# Patient Record
Sex: Male | Born: 1945 | Race: White | Hispanic: No | Smoking: Former smoker
Health system: Southern US, Community
[De-identification: ages and names within clinical notes are randomized; demographics above are authoritative.]

## PROBLEM LIST (undated history)

## (undated) DIAGNOSIS — I10 Essential (primary) hypertension: Secondary | ICD-10-CM

## (undated) HISTORY — PX: TONSILLECTOMY AND ADENOIDECTOMY: SUR1326

## (undated) HISTORY — PX: CIRCUMCISION: SUR203

---

## 2016-04-07 DIAGNOSIS — Z87891 Personal history of nicotine dependence: Secondary | ICD-10-CM | POA: Diagnosis not present

## 2016-04-07 DIAGNOSIS — E785 Hyperlipidemia, unspecified: Secondary | ICD-10-CM | POA: Diagnosis not present

## 2016-04-07 DIAGNOSIS — I10 Essential (primary) hypertension: Secondary | ICD-10-CM | POA: Diagnosis not present

## 2016-10-18 DIAGNOSIS — Z23 Encounter for immunization: Secondary | ICD-10-CM | POA: Diagnosis not present

## 2016-10-18 DIAGNOSIS — S81012A Laceration without foreign body, left knee, initial encounter: Secondary | ICD-10-CM | POA: Diagnosis not present

## 2016-11-11 DIAGNOSIS — B9789 Other viral agents as the cause of diseases classified elsewhere: Secondary | ICD-10-CM | POA: Diagnosis not present

## 2017-04-18 DIAGNOSIS — I10 Essential (primary) hypertension: Secondary | ICD-10-CM | POA: Diagnosis not present

## 2017-04-18 DIAGNOSIS — E785 Hyperlipidemia, unspecified: Secondary | ICD-10-CM | POA: Diagnosis not present

## 2017-07-24 DIAGNOSIS — T63444A Toxic effect of venom of bees, undetermined, initial encounter: Secondary | ICD-10-CM | POA: Diagnosis not present

## 2018-05-19 DIAGNOSIS — J019 Acute sinusitis, unspecified: Secondary | ICD-10-CM | POA: Diagnosis not present

## 2018-06-22 DIAGNOSIS — N400 Enlarged prostate without lower urinary tract symptoms: Secondary | ICD-10-CM | POA: Diagnosis not present

## 2018-06-22 DIAGNOSIS — Z1211 Encounter for screening for malignant neoplasm of colon: Secondary | ICD-10-CM | POA: Diagnosis not present

## 2018-06-22 DIAGNOSIS — Z1159 Encounter for screening for other viral diseases: Secondary | ICD-10-CM | POA: Diagnosis not present

## 2018-06-22 DIAGNOSIS — D696 Thrombocytopenia, unspecified: Secondary | ICD-10-CM | POA: Diagnosis not present

## 2018-06-22 DIAGNOSIS — E785 Hyperlipidemia, unspecified: Secondary | ICD-10-CM | POA: Diagnosis not present

## 2018-06-22 DIAGNOSIS — Z5181 Encounter for therapeutic drug level monitoring: Secondary | ICD-10-CM | POA: Diagnosis not present

## 2018-06-22 DIAGNOSIS — I10 Essential (primary) hypertension: Secondary | ICD-10-CM | POA: Diagnosis not present

## 2018-06-22 DIAGNOSIS — D649 Anemia, unspecified: Secondary | ICD-10-CM | POA: Diagnosis not present

## 2018-07-31 DIAGNOSIS — D485 Neoplasm of uncertain behavior of skin: Secondary | ICD-10-CM | POA: Diagnosis not present

## 2018-08-02 DIAGNOSIS — L989 Disorder of the skin and subcutaneous tissue, unspecified: Secondary | ICD-10-CM | POA: Diagnosis not present

## 2018-08-02 DIAGNOSIS — D485 Neoplasm of uncertain behavior of skin: Secondary | ICD-10-CM | POA: Diagnosis not present

## 2018-08-03 DIAGNOSIS — L989 Disorder of the skin and subcutaneous tissue, unspecified: Secondary | ICD-10-CM | POA: Diagnosis not present

## 2018-08-03 DIAGNOSIS — L578 Other skin changes due to chronic exposure to nonionizing radiation: Secondary | ICD-10-CM | POA: Diagnosis not present

## 2018-08-07 DIAGNOSIS — H5203 Hypermetropia, bilateral: Secondary | ICD-10-CM | POA: Diagnosis not present

## 2018-08-07 DIAGNOSIS — H02831 Dermatochalasis of right upper eyelid: Secondary | ICD-10-CM | POA: Diagnosis not present

## 2018-08-07 DIAGNOSIS — H16223 Keratoconjunctivitis sicca, not specified as Sjogren's, bilateral: Secondary | ICD-10-CM | POA: Diagnosis not present

## 2018-08-07 DIAGNOSIS — H52203 Unspecified astigmatism, bilateral: Secondary | ICD-10-CM | POA: Diagnosis not present

## 2018-08-07 DIAGNOSIS — H04123 Dry eye syndrome of bilateral lacrimal glands: Secondary | ICD-10-CM | POA: Diagnosis not present

## 2018-08-07 DIAGNOSIS — H2513 Age-related nuclear cataract, bilateral: Secondary | ICD-10-CM | POA: Diagnosis not present

## 2018-08-07 DIAGNOSIS — H43393 Other vitreous opacities, bilateral: Secondary | ICD-10-CM | POA: Diagnosis not present

## 2018-08-07 DIAGNOSIS — H02834 Dermatochalasis of left upper eyelid: Secondary | ICD-10-CM | POA: Diagnosis not present

## 2018-08-07 DIAGNOSIS — H524 Presbyopia: Secondary | ICD-10-CM | POA: Diagnosis not present

## 2018-08-28 DIAGNOSIS — D696 Thrombocytopenia, unspecified: Secondary | ICD-10-CM | POA: Diagnosis not present

## 2018-08-28 DIAGNOSIS — R799 Abnormal finding of blood chemistry, unspecified: Secondary | ICD-10-CM | POA: Diagnosis not present

## 2018-08-28 DIAGNOSIS — D693 Immune thrombocytopenic purpura: Secondary | ICD-10-CM | POA: Diagnosis not present

## 2018-12-14 DIAGNOSIS — J111 Influenza due to unidentified influenza virus with other respiratory manifestations: Secondary | ICD-10-CM | POA: Diagnosis not present

## 2019-02-05 DIAGNOSIS — N400 Enlarged prostate without lower urinary tract symptoms: Secondary | ICD-10-CM | POA: Diagnosis not present

## 2019-02-05 DIAGNOSIS — Z0000 Encounter for general adult medical examination without abnormal findings: Secondary | ICD-10-CM | POA: Diagnosis not present

## 2019-02-05 DIAGNOSIS — I10 Essential (primary) hypertension: Secondary | ICD-10-CM | POA: Diagnosis not present

## 2019-02-05 DIAGNOSIS — Z125 Encounter for screening for malignant neoplasm of prostate: Secondary | ICD-10-CM | POA: Diagnosis not present

## 2019-02-05 DIAGNOSIS — D696 Thrombocytopenia, unspecified: Secondary | ICD-10-CM | POA: Diagnosis not present

## 2019-02-05 DIAGNOSIS — E785 Hyperlipidemia, unspecified: Secondary | ICD-10-CM | POA: Diagnosis not present

## 2019-02-05 DIAGNOSIS — N529 Male erectile dysfunction, unspecified: Secondary | ICD-10-CM | POA: Diagnosis not present

## 2019-08-22 DIAGNOSIS — N529 Male erectile dysfunction, unspecified: Secondary | ICD-10-CM | POA: Diagnosis not present

## 2019-08-22 DIAGNOSIS — I10 Essential (primary) hypertension: Secondary | ICD-10-CM | POA: Diagnosis not present

## 2019-08-22 DIAGNOSIS — N400 Enlarged prostate without lower urinary tract symptoms: Secondary | ICD-10-CM | POA: Diagnosis not present

## 2019-08-22 DIAGNOSIS — E785 Hyperlipidemia, unspecified: Secondary | ICD-10-CM | POA: Diagnosis not present

## 2019-08-22 DIAGNOSIS — Z0000 Encounter for general adult medical examination without abnormal findings: Secondary | ICD-10-CM | POA: Diagnosis not present

## 2019-10-22 DIAGNOSIS — H5203 Hypermetropia, bilateral: Secondary | ICD-10-CM | POA: Diagnosis not present

## 2019-10-22 DIAGNOSIS — H524 Presbyopia: Secondary | ICD-10-CM | POA: Diagnosis not present

## 2019-10-22 DIAGNOSIS — H43393 Other vitreous opacities, bilateral: Secondary | ICD-10-CM | POA: Diagnosis not present

## 2019-10-22 DIAGNOSIS — H02831 Dermatochalasis of right upper eyelid: Secondary | ICD-10-CM | POA: Diagnosis not present

## 2019-10-22 DIAGNOSIS — H16223 Keratoconjunctivitis sicca, not specified as Sjogren's, bilateral: Secondary | ICD-10-CM | POA: Diagnosis not present

## 2019-10-22 DIAGNOSIS — H02834 Dermatochalasis of left upper eyelid: Secondary | ICD-10-CM | POA: Diagnosis not present

## 2019-10-22 DIAGNOSIS — H52203 Unspecified astigmatism, bilateral: Secondary | ICD-10-CM | POA: Diagnosis not present

## 2019-10-22 DIAGNOSIS — H2513 Age-related nuclear cataract, bilateral: Secondary | ICD-10-CM | POA: Diagnosis not present

## 2019-11-05 DIAGNOSIS — Z23 Encounter for immunization: Secondary | ICD-10-CM | POA: Diagnosis not present

## 2020-03-02 ENCOUNTER — Ambulatory Visit: Payer: Self-pay | Attending: Internal Medicine

## 2020-03-02 DIAGNOSIS — Z23 Encounter for immunization: Secondary | ICD-10-CM

## 2020-03-02 NOTE — Progress Notes (Signed)
   Covid-19 Vaccination Clinic  Name:  Robert Stark    MRN: [id number] DOB: 1946/01/16  03/02/2020  [name] was observed post Covid-19 immunization for 15 minutes without incident. He was provided with Vaccine Information Sheet and instruction to access the V-Safe system.   [name] was instructed to call 911 with any severe reactions post vaccine: Marland Kitchen Difficulty breathing  . Swelling of face and throat  . A fast heartbeat  . A bad rash all over body  . Dizziness and weakness   Immunizations Administered    Name Date Dose VIS Date Route   Pfizer COVID-19 Vaccine 03/02/2020  5:13 PM 0.3 mL 11/29/2019 Intramuscular   Manufacturer: [employer]   Lot: [id number]   [place]: [id number]

## 2020-04-09 DIAGNOSIS — N529 Male erectile dysfunction, unspecified: Secondary | ICD-10-CM | POA: Diagnosis not present

## 2020-04-09 DIAGNOSIS — M722 Plantar fascial fibromatosis: Secondary | ICD-10-CM | POA: Diagnosis not present

## 2020-04-09 DIAGNOSIS — M79671 Pain in right foot: Secondary | ICD-10-CM | POA: Diagnosis not present

## 2020-12-03 DIAGNOSIS — H2513 Age-related nuclear cataract, bilateral: Secondary | ICD-10-CM | POA: Diagnosis not present

## 2021-06-10 DIAGNOSIS — E785 Hyperlipidemia, unspecified: Secondary | ICD-10-CM | POA: Diagnosis not present

## 2021-06-10 DIAGNOSIS — I10 Essential (primary) hypertension: Secondary | ICD-10-CM | POA: Diagnosis not present

## 2021-06-14 DIAGNOSIS — Z0000 Encounter for general adult medical examination without abnormal findings: Secondary | ICD-10-CM | POA: Diagnosis not present

## 2021-06-14 DIAGNOSIS — I10 Essential (primary) hypertension: Secondary | ICD-10-CM | POA: Diagnosis not present

## 2021-07-30 DIAGNOSIS — H47011 Ischemic optic neuropathy, right eye: Secondary | ICD-10-CM | POA: Diagnosis not present

## 2021-07-30 DIAGNOSIS — H4710 Unspecified papilledema: Secondary | ICD-10-CM | POA: Diagnosis not present

## 2021-08-18 DIAGNOSIS — H47019 Ischemic optic neuropathy, unspecified eye: Secondary | ICD-10-CM | POA: Diagnosis not present

## 2021-08-18 DIAGNOSIS — Z87891 Personal history of nicotine dependence: Secondary | ICD-10-CM | POA: Diagnosis not present

## 2021-09-02 DIAGNOSIS — H4710 Unspecified papilledema: Secondary | ICD-10-CM | POA: Diagnosis not present

## 2021-09-02 DIAGNOSIS — H47011 Ischemic optic neuropathy, right eye: Secondary | ICD-10-CM | POA: Diagnosis not present

## 2021-09-03 DIAGNOSIS — I10 Essential (primary) hypertension: Secondary | ICD-10-CM | POA: Diagnosis not present

## 2021-09-03 DIAGNOSIS — H47019 Ischemic optic neuropathy, unspecified eye: Secondary | ICD-10-CM | POA: Diagnosis not present

## 2021-09-28 DIAGNOSIS — M545 Low back pain, unspecified: Secondary | ICD-10-CM | POA: Diagnosis not present

## 2021-09-28 DIAGNOSIS — I10 Essential (primary) hypertension: Secondary | ICD-10-CM | POA: Diagnosis not present

## 2021-09-28 DIAGNOSIS — H47019 Ischemic optic neuropathy, unspecified eye: Secondary | ICD-10-CM | POA: Diagnosis not present

## 2021-09-28 DIAGNOSIS — Z23 Encounter for immunization: Secondary | ICD-10-CM | POA: Diagnosis not present

## 2021-09-28 DIAGNOSIS — G8929 Other chronic pain: Secondary | ICD-10-CM | POA: Diagnosis not present

## 2021-10-27 DIAGNOSIS — R0602 Shortness of breath: Secondary | ICD-10-CM | POA: Diagnosis not present

## 2021-10-28 ENCOUNTER — Emergency Department (HOSPITAL_BASED_OUTPATIENT_CLINIC_OR_DEPARTMENT_OTHER): Payer: PPO

## 2021-10-28 ENCOUNTER — Other Ambulatory Visit: Payer: Self-pay

## 2021-10-28 ENCOUNTER — Encounter (HOSPITAL_BASED_OUTPATIENT_CLINIC_OR_DEPARTMENT_OTHER): Payer: Self-pay | Admitting: Emergency Medicine

## 2021-10-28 ENCOUNTER — Emergency Department (HOSPITAL_BASED_OUTPATIENT_CLINIC_OR_DEPARTMENT_OTHER)
Admission: EM | Admit: 2021-10-28 | Discharge: 2021-10-28 | Disposition: A | Discharge status: Home / Self Care | Payer: PPO | Attending: Emergency Medicine | Admitting: Emergency Medicine

## 2021-10-28 ENCOUNTER — Other Ambulatory Visit (HOSPITAL_BASED_OUTPATIENT_CLINIC_OR_DEPARTMENT_OTHER): Payer: Self-pay

## 2021-10-28 DIAGNOSIS — Z87891 Personal history of nicotine dependence: Secondary | ICD-10-CM | POA: Insufficient documentation

## 2021-10-28 DIAGNOSIS — I4891 Unspecified atrial fibrillation: Secondary | ICD-10-CM | POA: Insufficient documentation

## 2021-10-28 DIAGNOSIS — R059 Cough, unspecified: Secondary | ICD-10-CM | POA: Insufficient documentation

## 2021-10-28 DIAGNOSIS — R0602 Shortness of breath: Secondary | ICD-10-CM | POA: Diagnosis not present

## 2021-10-28 DIAGNOSIS — Z20822 Contact with and (suspected) exposure to covid-19: Secondary | ICD-10-CM | POA: Insufficient documentation

## 2021-10-28 DIAGNOSIS — I10 Essential (primary) hypertension: Secondary | ICD-10-CM | POA: Diagnosis not present

## 2021-10-28 HISTORY — DX: Essential (primary) hypertension: I10

## 2021-10-28 LAB — TSH: TSH: 0.566 u[IU]/mL (ref 0.350–4.500)

## 2021-10-28 LAB — COMPREHENSIVE METABOLIC PANEL
ALT: 23 U/L (ref 0–44)
AST: 16 U/L (ref 15–41)
Albumin: 4.4 g/dL (ref 3.5–5.0)
Alkaline Phosphatase: 73 U/L (ref 38–126)
Anion gap: 11 (ref 5–15)
BUN: 26 mg/dL — ABNORMAL HIGH (ref 8–23)
CO2: 25 mmol/L (ref 22–32)
Calcium: 9.7 mg/dL (ref 8.9–10.3)
Chloride: 108 mmol/L (ref 98–111)
Creatinine, Ser: 1.61 mg/dL — ABNORMAL HIGH (ref 0.61–1.24)
GFR, Estimated: 45 mL/min — ABNORMAL LOW (ref >60)
Glucose, Bld: 145 mg/dL — ABNORMAL HIGH (ref 70–99)
Potassium: 3.7 mmol/L (ref 3.5–5.1)
Sodium: 144 mmol/L (ref 135–145)
Total Bilirubin: 2.1 mg/dL — ABNORMAL HIGH (ref 0.3–1.2)
Total Protein: 6.2 g/dL — ABNORMAL LOW (ref 6.5–8.1)

## 2021-10-28 LAB — URINALYSIS, ROUTINE W REFLEX MICROSCOPIC
Bilirubin Urine: NEGATIVE
Glucose, UA: NEGATIVE mg/dL
Hgb urine dipstick: NEGATIVE
Ketones, ur: NEGATIVE mg/dL
Leukocytes,Ua: NEGATIVE
Nitrite: NEGATIVE
Protein, ur: NEGATIVE mg/dL
Specific Gravity, Urine: 1.015 (ref 1.005–1.030)
pH: 5.5 (ref 5.0–8.0)

## 2021-10-28 LAB — CBC
HCT: 41.2 % (ref 39.0–52.0)
Hemoglobin: 14.3 g/dL (ref 13.0–17.0)
MCH: 30.8 pg (ref 26.0–34.0)
MCHC: 34.7 g/dL (ref 30.0–36.0)
MCV: 88.6 fL (ref 80.0–100.0)
Platelets: 102 10*3/uL — ABNORMAL LOW (ref 150–400)
RBC: 4.65 MIL/uL (ref 4.22–5.81)
RDW: 14.1 % (ref 11.5–15.5)
WBC: 12 10*3/uL — ABNORMAL HIGH (ref 4.0–10.5)
nRBC: 0 % (ref 0.0–0.2)

## 2021-10-28 LAB — TROPONIN I (HIGH SENSITIVITY)
Troponin I (High Sensitivity): 10 ng/L (ref <18)
Troponin I (High Sensitivity): 9 ng/L (ref <18)

## 2021-10-28 LAB — RESP PANEL BY RT-PCR (FLU A&B, COVID) ARPGX2
Influenza A by PCR: NEGATIVE
Influenza B by PCR: NEGATIVE
SARS Coronavirus 2 by RT PCR: NEGATIVE

## 2021-10-28 MED ORDER — DILTIAZEM HCL 25 MG/5ML IV SOLN
20.0000 mg | Freq: Once | INTRAVENOUS | Status: AC
  Administered 2021-10-28: 20 mg via INTRAVENOUS
  Filled 2021-10-28: qty 5

## 2021-10-28 MED ORDER — RIVAROXABAN (XARELTO) VTE STARTER PACK (15 & 20 MG)
ORAL_TABLET | ORAL | 0 refills | Status: DC
  Filled 2021-10-28: qty 51, 30d supply, fill #0

## 2021-10-28 MED ORDER — ALBUTEROL SULFATE HFA 108 (90 BASE) MCG/ACT IN AERS
2.0000 | INHALATION_SPRAY | RESPIRATORY_TRACT | Status: DC | PRN
  Administered 2021-10-28: 2 via RESPIRATORY_TRACT
  Filled 2021-10-28: qty 6.7

## 2021-10-28 MED ORDER — RIVAROXABAN (XARELTO) VTE STARTER PACK (15 & 20 MG): ORAL_TABLET | ORAL | 0 refills | Status: DC

## 2021-10-28 NOTE — ED Notes (Signed)
ED Provider at bedside. 

## 2021-10-28 NOTE — ED Triage Notes (Signed)
Cough x 2 weeks , shortness of breath at rest x 2 weeks . Fatigue . Denies fever

## 2021-10-28 NOTE — ED Notes (Addendum)
Pt ambulated to restroom with steady gait. No issues observed. Pts HR stayed around 90bpm and sp02 stayed around 95% during ambulation. PA made aware.

## 2021-10-28 NOTE — Discharge Instructions (Addendum)
You are found to have an abnormal heart rhythm today.  It is called atrial fibrillation.  Information about this is attached to your discharge papers.  Please take the blood thinner at your pharmacy as prescribed.  Information about this blood thinner is also attached to these papers.  Follow-up with a cardiologist early next week and schedule an appointment with your primary care provider soon as possible to discuss your visit today.  It is also important to discuss your kidney function at this appointment.  Please return to the emergency department if you begin to feel short of breath, have chest pain, become dizzy or any overall worsening of your condition.  It was a pleasure to meet you today and I hope that you feel better.

## 2021-10-28 NOTE — ED Provider Notes (Signed)
[name] Provider Note   CSN: [id number] Arrival date & time: 10/28/21  1009     History Chief Complaint  Patient presents with   Shortness of Breath    cough    Robert Stark is a 75 y.o. male with a past medical history of hypertension presenting today with complaint of shortness of breath and cough.  He had an appointment with his primary care yesterday who suggested that he go to the emergency department for a chest x-ray.  States he has been feeling this way for the past 2 weeks.  No chest pain, dizziness, fever, chills.  Takes amlodipine and "male enhancement" that he started about a month ago.  Ordered this supplement off of a commercial however has since discontinued it with concern for vision problems.  Followed by ophthalmology.   Past Medical History:  Diagnosis Date   Hypertension     There are no problems to display for this patient.   History reviewed. No pertinent surgical history.     No family history on file.  Social History   Tobacco Use   Smoking status: Former    Types: Cigarettes  Substance Use Topics   Alcohol use: Not Currently   Drug use: Not Currently    Home Medications Prior to Admission medications   Not on File    Allergies    Cheese, Bee venom, and Wasp venom  Review of Systems   Review of Systems  Constitutional:  Negative for chills and fever.  Respiratory:  Positive for cough and shortness of breath.   Cardiovascular:  Negative for chest pain and palpitations.  Gastrointestinal:  Negative for nausea and vomiting.  Musculoskeletal:  Negative for back pain.  Neurological:  Negative for dizziness and light-headedness.  All other systems reviewed and are negative.  Physical Exam Updated Vital Signs BP 139/90   Pulse 61   Temp 98.3 F (36.8 C) (Oral)   Resp (!) 24   Ht 5\' 6"  (1.676 m)   Wt 68 kg   SpO2 92%   BMI 24.21 kg/m   Physical Exam Vitals and nursing note reviewed.   Constitutional:      Appearance: Normal appearance.  HENT:     Head: Normocephalic and atraumatic.     Mouth/Throat:     Mouth: Mucous membranes are moist.     Pharynx: Oropharynx is clear.  Eyes:     General: No scleral icterus.    Conjunctiva/sclera: Conjunctivae normal.  Cardiovascular:     Rate and Rhythm: Tachycardia present. Rhythm irregular.  Pulmonary:     Effort: Pulmonary effort is normal. Tachypnea present. No respiratory distress.     Breath sounds: No decreased breath sounds, wheezing or rhonchi.  Skin:    General: Skin is warm and dry.     Findings: Ecchymosis (Area of bruising on right flank) present. No rash.  Neurological:     Mental Status: He is alert.  Psychiatric:        Mood and Affect: Mood normal.        Behavior: Behavior normal.    ED Results / Procedures / Treatments   Labs (all labs ordered are listed, but only abnormal results are displayed) Labs Reviewed  RESP PANEL BY RT-PCR (FLU A&B, COVID) ARPGX2  COMPREHENSIVE METABOLIC PANEL  CBC  TSH  URINALYSIS, ROUTINE W REFLEX MICROSCOPIC  TROPONIN I (HIGH SENSITIVITY)    EKG EKG Interpretation  Date/Time:  Thursday October 28 2021 10:30:52 EST Ventricular Rate:  139 PR Interval:    QRS Duration: 96 QT Interval:  334 QTC Calculation: 508 R Axis:   84 Text Interpretation: Atrial fibrillation with rapid ventricular response Nonspecific T wave abnormality Abnormal ECG No old tracing to compare Confirmed by Isla Pence [phone]) on 10/28/2021 10:40:34 AM  Radiology DG Chest 2 View  Result Date: 10/28/2021 CLINICAL DATA:  sob EXAM: CHEST - 2 VIEW COMPARISON:  None. FINDINGS: Small right and possible trace left pleural effusion. Mild overlying bibasilar opacities. No visible pneumothorax. No acute osseous abnormality. IMPRESSION: Small right and possible trace left pleural effusions. Mild overlying bibasilar opacities, most likely atelectasis. Electronically Signed   By: Margaretha Sheffield M.D.    On: 10/28/2021 10:49    Procedures Procedures   Medications Ordered in ED Medications  albuterol (VENTOLIN HFA) 108 (90 Base) MCG/ACT inhaler 2 puff (has no administration in time range)  diltiazem (CARDIZEM) injection 20 mg (has no administration in time range)    ED Course  I have reviewed the triage vital signs and the nursing notes.  Pertinent labs & imaging results that were available during my care of the patient were reviewed by me and considered in my medical decision making (see chart for details).    MDM Rules/Calculators/A&P The emergent differential diagnosis for shortness of breath includes, but is not limited to, Pulmonary edema, bronchoconstriction, Pneumonia, Pulmonary embolism, Pneumotherax/ Hemothorax, Dysrythmia, ACS.  All of these were considered throughout the evaluation of the patient.  When he arrived his EKG showed atrial fibrillation with RVR between 100-140s.  He does not have a history of this.  Sees a provider every year and takes blood pressure medication however no others.  He has been feeling this way for the past 2 weeks so he is out of the window for cardioversion today in the emergency department.  He was given 20 of diltiazem and patient's heart rate came down to the 70s. Lab work shows a bump in patient's creatinine, GFR measured at 45.  This is a decline in kidney function from his labs over the summer.  We discussed this and he will speak about this with his primary care provider.  Other lab work and COVID/flu testing negative.  Troponin negative.  No abnormalities on urinalysis.  Patient stable at this time.  He is ambulating throughout the department without a dramatic increase in his heart rate.  I am unsure what prompted his atrial fibrillation, however he is ready to be discharged with a Xarelto starter pack and cardiologist for follow-up.  We discussed that it is important for him to continue to not take any supplements or erectile dysfunction  medications at this time.  We also discussed proper use of blood thinners.  This will again be explained to him in his discharge papers and at the pharmacy.  He may follow-up with his  cardiologist or primary care provider about the longevity of anticoagulation.  Bleeding risk discussed.  Agreeable and stable for discharge at this time.  Final Clinical Impression(s) / ED Diagnoses Final diagnoses:  Atrial fibrillation, unspecified type ([place])    Rx / DC Orders Results and diagnoses were explained to the patient. Return precautions discussed in full. Patient had no additional questions and expressed complete understanding.     Rhae Hammock, PA-C 10/28/21 1323    Isla Pence, MD 10/28/21 1330

## 2021-10-29 ENCOUNTER — Telehealth (HOSPITAL_COMMUNITY): Payer: Self-pay

## 2021-10-29 ENCOUNTER — Other Ambulatory Visit (HOSPITAL_BASED_OUTPATIENT_CLINIC_OR_DEPARTMENT_OTHER): Payer: Self-pay

## 2021-10-29 NOTE — Telephone Encounter (Signed)
Patient was discharged on Xarelto starter pack on 10/28/21 but due to patient's Creatinine Clearance being less than 50, only 15mg  daily is currently recommended for Afib. Spoke with patient over the phone and advised patient to only take one of the 15mg  punch out tablets daily and with a meal until they follow up with their cardiologist on 11/16/21. Patient verbalized understanding of taking only one 15mg  tablet daily with a meal.   Waverly Ferrari, PharmD

## 2021-11-09 DIAGNOSIS — H4710 Unspecified papilledema: Secondary | ICD-10-CM | POA: Diagnosis not present

## 2021-11-09 DIAGNOSIS — J069 Acute upper respiratory infection, unspecified: Secondary | ICD-10-CM | POA: Diagnosis not present

## 2021-11-09 DIAGNOSIS — I10 Essential (primary) hypertension: Secondary | ICD-10-CM | POA: Diagnosis not present

## 2021-11-09 DIAGNOSIS — Z09 Encounter for follow-up examination after completed treatment for conditions other than malignant neoplasm: Secondary | ICD-10-CM | POA: Diagnosis not present

## 2021-11-09 DIAGNOSIS — H47011 Ischemic optic neuropathy, right eye: Secondary | ICD-10-CM | POA: Diagnosis not present

## 2021-11-09 DIAGNOSIS — I4891 Unspecified atrial fibrillation: Secondary | ICD-10-CM | POA: Diagnosis not present

## 2021-11-16 ENCOUNTER — Ambulatory Visit (INDEPENDENT_AMBULATORY_CARE_PROVIDER_SITE_OTHER): Payer: PPO | Admitting: Cardiovascular Disease

## 2021-11-16 ENCOUNTER — Other Ambulatory Visit: Payer: Self-pay

## 2021-11-16 ENCOUNTER — Encounter: Payer: Self-pay | Admitting: Cardiovascular Disease

## 2021-11-16 VITALS — BP 134/86 | HR 109 | Ht 66.0 in | Wt 149.0 lb

## 2021-11-16 DIAGNOSIS — N522 Drug-induced erectile dysfunction: Secondary | ICD-10-CM

## 2021-11-16 DIAGNOSIS — I509 Heart failure, unspecified: Secondary | ICD-10-CM | POA: Diagnosis not present

## 2021-11-16 DIAGNOSIS — E785 Hyperlipidemia, unspecified: Secondary | ICD-10-CM | POA: Diagnosis not present

## 2021-11-16 DIAGNOSIS — I10 Essential (primary) hypertension: Secondary | ICD-10-CM | POA: Diagnosis not present

## 2021-11-16 DIAGNOSIS — I4819 Other persistent atrial fibrillation: Secondary | ICD-10-CM

## 2021-11-16 MED ORDER — RIVAROXABAN 15 MG PO TABS: 15.0000 mg | ORAL_TABLET | Freq: Every day | ORAL | 3 refills | Status: DC

## 2021-11-16 MED ORDER — DILTIAZEM HCL ER COATED BEADS 180 MG PO CP24: 180.0000 mg | ORAL_CAPSULE | Freq: Every day | ORAL | 3 refills | Status: DC

## 2021-11-16 NOTE — Progress Notes (Addendum)
Cardiology Office Note:    Date:  11/16/2021   ID:  Robert Stark, DOB June 30, 1946, MRN [id number]  PCP:  Pcp, No   [facility] Providers Cardiologist:  None     Referring MD: Isla Pence, MD   Chief Complaint  Patient presents with   Atrial Fibrillation  Robert Stark is a 75 y.o. male who is being seen today for the evaluation of newly diagnosed atrial fibrillation  at the request of Isla Pence, MD.   History of Present Illness:    Robert Stark is a 75 y.o. male with a hx of systemic hypertension, BPH, cataracts, low back pain,  and erectile dysfunction, but otherwise good health, who was referred in consultation for newly recognized atrial fibrillation.  He presented to the emergency room on October 28, 2021 with complaints of shortness of breath.  He did not have any subjective palpitations.  He was found to be in atrial fibrillation with rapid ventricular response with a rate of around 140 bpm.  He denied orthopnea or PND or chest pain.  He denied dizziness or syncope.    His electrocardiogram was normal except for the presence of arrhythmia.  Cardiac troponin was normal.  BNP was not checked.  Chest x-ray did not show cardiomegaly or pulmonary infiltrates.  Routine labs were significant for an elevated creatinine of 1.6 (1.0 in June 2022) and normal electrolytes.  WBC was nominally elevated at 12 K, hemoglobin was normal at 14.3.  Flu and coronavirus assays were negative.  TSH was normal at 0.566.  Symptoms improved promptly after he received the diltiazem which controlled his heart rate.  He was prescribed Xarelto, but was not prescribed any rate control medication.  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  He understood that this was related to taking a supplement called "blood flow 7", which he has stopped. 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  Labs were checked including ESR CRP and CBC (all were normal).  No medications were ordered prescribed.  Had a follow-up visit with his primary care provider 1122 his blood pressure was normal at 124/80 and his heart rate was 70 (unclear if this was an automatic blood pressure cuff reading or based on auscultation or ECG).  Today he presents in atrial fibrillation with a ventricular rate of 109 bpm at rest.  He remains oblivious to the palpitations.  He is not currently complaining of shortness of breath.  Blood pressure is well controlled on a combination of amlodipine, losartan and hydrochlorothiazide.  He also takes tamsulosin for prostatism.  He has not had any bleeding, falls or injuries.  He reports compliance with Xarelto 50 mg once daily.  He was provided a courtesy starter pack with dosing for pulmonary embolism, but he understood that he is supposed to only take the 15 mg tablets once a day.  He has mild exertional dyspnea but denies orthopnea or PND or chest pain.  He does have a nonproductive cough for which he was prescribed Tessalon Perles.  It does appear to worsen when he lies in bed at night.  He denies previous history of cardiac illness and reports that his blood pressure has always been well controlled.  He denies heavy snoring or daytime hypersomnolence.  He quit smoking 7 years  ago.  Labs checked in 2020 show an LDL cholesterol of 58 and an HDL of 31, triglycerides normal at 75, total cholesterol 97.    Past Medical History:  Diagnosis Date   Hypertension     Past Surgical History:  Procedure Laterality Date   CIRCUMCISION     TONSILLECTOMY AND ADENOIDECTOMY      Current Medications: Current Meds  Medication Sig   atorvastatin (LIPITOR) 40 MG tablet Take 40 mg by mouth daily.   benzonatate (TESSALON) 200 MG capsule Take 200 mg by mouth 3 (three) times  daily as needed.   diltiazem (CARDIZEM CD) 180 MG 24 hr capsule Take 1 capsule (180 mg total) by mouth daily.   losartan-hydrochlorothiazide (HYZAAR) 100-12.5 MG tablet TAKE 1 TABLET BY MOUTH EVERY DAY FOR BLOOD PRESSURE   Rivaroxaban (XARELTO) 15 MG TABS tablet Take 1 tablet (15 mg total) by mouth daily with supper.   sildenafil (VIAGRA) 25 MG tablet Take by mouth.   tamsulosin (FLOMAX) 0.4 MG CAPS capsule TAKE 1 CAPSULE BY MOUTH DAILY FOR PROSTATE   [DISCONTINUED] amLODipine (NORVASC) 5 MG tablet Take 5 mg by mouth daily.   [DISCONTINUED] RIVAROXABAN (XARELTO) VTE STARTER PACK (15 & 20 MG) Follow package directions: Take one 27m tablet by mouth twice a day. On day 22, switch to one 219mtablet once a day. Take with food.     Allergies:   Cheese, Bee venom, and Wasp venom   Social History   Socioeconomic History   Marital status: Widowed    Spouse name: Not on file   Number of children: Not on file   Years of education: Not on file   Highest education level: Not on file  Occupational History   Not on file  Tobacco Use   Smoking status: Former    Types: Cigarettes   Smokeless tobacco: Not on file  Substance and Sexual Activity   Alcohol use: Not Currently   Drug use: Not Currently   Sexual activity: Not on file  Other Topics Concern   Not on file  Social History Narrative   Not on file   Social Determinants of Health   Financial Resource Strain: Not on file  Food Insecurity: Not on file  Transportation Needs: Not on file  Physical Activity: Not on file  Stress: Not on file  Social Connections: Not on file     Family History: The patient's family history includes COPD in his brother.  ROS:   Please see the history of present illness.     All other systems reviewed and are negative.  EKGs/Labs/Other Studies Reviewed:    The following studies were reviewed today: Notes from primary care provider, ophthalmologist and recent emergency room visit, chest x-ray,  multiple labs, EKG tracings.  EKG:  EKG is  ordered today.  The ekg ordered today demonstrates atrial fibrillation with rapid ventricular response at 109 bpm, otherwise normal tracing.  Recent Labs: 10/28/2021: ALT 23; BUN 26; Creatinine, Ser 1.61; Hemoglobin 14.3; Platelets 102; Potassium 3.7; Sodium 144; TSH 0.566  Recent Lipid Panel No results found for: CHOL, TRIG, HDL, CHOLHDL, VLDL, LDLCALC, LDLDIRECT   Risk Assessment/Calculations:    CHA2DS2-VASc Score = 2   This indicates a 2.2% annual risk of stroke. 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  Physical Exam:    VS:  BP 134/86   Pulse (!) 109   Ht '5\' 6"'  (1.676 m)   Wt 149 lb (67.6 kg)   SpO2 97%   BMI 24.05 kg/m     Wt Readings from Last 3 Encounters:  11/16/21 149 lb (67.6 kg)  10/28/21 150 lb (68 kg)     GEN: Appears younger than stated age, well nourished, well developed in no acute distress HEENT: Normal NECK: No JVD; No carotid bruits LYMPHATICS: No lymphadenopathy CARDIAC: RRR, no murmurs, rubs, gallops RESPIRATORY:  Clear to auscultation without rales, wheezing or rhonchi  ABDOMEN: Soft, non-tender, non-distended MUSCULOSKELETAL:  No edema; No deformity  SKIN: Warm and dry NEUROLOGIC:  Alert and oriented x 3 PSYCHIATRIC:  Normal affect   ASSESSMENT:    1. Persistent atrial fibrillation ([place])   2. New onset of congestive heart failure ([place])   3. Essential hypertension   4. Dyslipidemia (high LDL; low HDL)   5. Drug-induced erectile dysfunction    PLAN:    In order of problems listed above:  Afib: This appears to be a persistent arrhythmia.  He has had  several clinical evaluations with his primary care provider recently that showed heart rates typically in the 50-60 bpm range, which is probably his baseline rate in sinus rhythm.  This would suggest that atrial fibrillation onset was probably within the last month.  Embolic risk is high.  CHA2DS2-VASc score is a minimum of 2, but could be as high as 4 if his ischemic optic neuropathy was due to an embolic event.  He has been compliant with Xarelto, dose adjusted for creatinine of 1.6.  However, we should recheck his renal function in a few weeks, since in June he had normal creatinine of 1.0.  He is tachycardic and is not on rate control medication.  We will stop his amlodipine and place him on diltiazem sustained-release.  We will check an echocardiogram to look for structural cardiac abnormalities underlying his arrhythmia.  After he has been on anticoagulant medication uninterrupted for a minimum of 1 month, we will bring him back to the A. fib clinic to discuss cardioversion.  He was skeptical about "stopping his heart" although I assured him that this is a relatively safe procedure and the chances of success are high, at least in the immediate term.  He will think about the procedure.  Anticoagulation and rate control are definitely the more pressing immediate options. CHF: His symptoms or shortness of breath that led to emergency room evaluation and possibly also his cough that worsens with supine position or symptoms of left heart failure.  Its possible that he was worried simply due to the rapid rate and will resolve with better rate control, but it is also possible that he has developed tachycardia related cardiomyopathy.  Wait for echocardiogram.  He is already on an angiotensin receptor blocker.  If EF is decreased, we will replace the diltiazem with carvedilol. HTN: Adequate blood pressure control.  Replace amlodipine with diltiazem to also provide rate control. HLP: Excellent LDL cholesterol on  statin therapy which should be continued.  HDL is relatively low.  In addition to hypertension this represents his only real coronary risk factor.  He quit smoking many years ago.  He does not have symptoms of CAD.  There are no physical findings to suggest PAD, other than the loss of vision in his right eye due to ischemic optic neuropathy. ED: He is still sexually active and would like to avoid medications that could worsen his erectile dysfunction.           Medication Adjustments/Labs and Tests Ordered: Current medicines are reviewed at length with the patient today.  Concerns regarding medicines are outlined above.  Orders Placed This Encounter  Procedures   Basic metabolic panel   CBC   EKG 12-Lead   ECHOCARDIOGRAM COMPLETE   Meds ordered this encounter  Medications   Rivaroxaban (XARELTO) 15 MG TABS tablet    Sig: Take 1 tablet (15 mg total) by mouth daily with supper.    Dispense:  90 tablet    Refill:  3   diltiazem (CARDIZEM CD) 180 MG 24 hr capsule    Sig: Take 1 capsule (180 mg total) by mouth daily.    Dispense:  90 capsule    Refill:  3    Patient Instructions  Medication Instructions:  STOP the Amlodipine  TAKE Xarelto 15 mg once daily  START Diltiazem 180 mg once daily  *If you need a refill on your cardiac medications before your next appointment, please call your pharmacy*  Testing/Procedures: Your physician has requested that you have an echocardiogram. Echocardiography is a painless test that uses sound waves to create images of your heart. It provides your doctor with information about the size and shape of your heart and how well your heart's chambers and valves are working. You may receive an ultrasound enhancing agent through an IV if needed to better visualize your heart during the echo.This procedure takes approximately one hour. There are no restrictions for this procedure. This will take place at the [address], [address].     Follow-Up: At [facility], you and your health needs are our priority.  As part of our continuing mission to provide you with exceptional heart care, we have created designated Provider Care Teams.  These Care Teams include your primary Cardiologist (physician) and Advanced Practice Providers (APPs -  Physician Assistants and Nurse Practitioners) who all work together to provide you with the care you need, when you need it.  We recommend signing up for the patient portal called "MyChart".  Sign up information is provided on this After Visit Summary.  MyChart is used to connect with patients for Virtual Visits (Telemedicine).  Patients are able to view lab/test results, encounter notes, upcoming appointments, etc.  Non-urgent messages can be sent to your provider as well.   To learn more about what you can do with MyChart, go to [email or web address].    Your next appointment:   Follow up the first week of January with the afib clinic.   Other Instructions  You are scheduled for a Cardioversion on 12/30/21 with Dr. Sallyanne Kuster.  Please arrive at the [facility] (Main Entrance A) at [facility]: [address] [place], [place] [address] at 6:30am am/pm. (1 hour prior to procedure)  DIET: Nothing to eat or drink after midnight except a sip of water with medications (see medication instructions below)  FYI: For your safety, and to allow [place] to monitor your vital signs accurately during the surgery/procedure we request that   if you have artificial nails, gel coating, SNS etc. Please have those removed prior to your surgery/procedure. Not having the nail coverings /polish removed may result in cancellation or delay of your  surgery/procedure.   Medication Instructions: Hold the Losartan-Hydrochlorothiazide the morning of the procedure   Continue your anticoagulant: Xarelto  You will need to continue your anticoagulant after your procedure until you  are told by your provider that it  is safe to stop   Labs:  Your provider would like for you to return January 3rd to have the following labs drawn: CBC and BMET. You do not need an appointment for the lab. Once in our office lobby there is a podium where you can sign in and ring the doorbell to alert [place] that you are here. The lab is open from 8:00 am to 4:30 pm; closed for lunch from 12:45pm-1:45pm.   You must have a responsible person to drive you home and stay in the waiting area during your procedure. Failure to do so could result in cancellation.  Bring your insurance cards.  *Special Note: Every effort is made to have your procedure done on time. Occasionally there are emergencies that occur at the hospital that may cause delays. Please be patient if a delay does occur.     Signed, Sanda Klein, MD  11/16/2021 9:42 PM    [facility]

## 2021-11-16 NOTE — Patient Instructions (Addendum)
Medication Instructions:  STOP the Amlodipine  TAKE Xarelto 15 mg once daily  START Diltiazem 180 mg once daily  *If you need a refill on your cardiac medications before your next appointment, please call your pharmacy*  Testing/Procedures: Your physician has requested that you have an echocardiogram. Echocardiography is a painless test that uses sound waves to create images of your heart. It provides your doctor with information about the size and shape of your heart and how well your heart's chambers and valves are working. You may receive an ultrasound enhancing agent through an IV if needed to better visualize your heart during the echo.This procedure takes approximately one hour. There are no restrictions for this procedure. This will take place at the [address], [address].    Follow-Up: At [facility], you and your health needs are our priority.  As part of our continuing mission to provide you with exceptional heart care, we have created designated Provider Care Teams.  These Care Teams include your primary Cardiologist (physician) and Advanced Practice Providers (APPs -  Physician Assistants and Nurse Practitioners) who all work together to provide you with the care you need, when you need it.  We recommend signing up for the patient portal called "MyChart".  Sign up information is provided on this After Visit Summary.  MyChart is used to connect with patients for Virtual Visits (Telemedicine).  Patients are able to view lab/test results, encounter notes, upcoming appointments, etc.  Non-urgent messages can be sent to your provider as well.   To learn more about what you can do with MyChart, go to [email or web address].    Your next appointment:   Follow up the first week of January with the afib clinic.   Other Instructions  You are scheduled for a Cardioversion on 12/30/21 with Dr. Sallyanne Kuster.  Please arrive at the [facility] (Main Entrance A) at [facility]:  [address] [place], [place] [address] at 6:30am am/pm. (1 hour prior to procedure)  DIET: Nothing to eat or drink after midnight except a sip of water with medications (see medication instructions below)  FYI: For your safety, and to allow [place] to monitor your vital signs accurately during the surgery/procedure we request that   if you have artificial nails, gel coating, SNS etc. Please have those removed prior to your surgery/procedure. Not having the nail coverings /polish removed may result in cancellation or delay of your surgery/procedure.   Medication Instructions: Hold the Losartan-Hydrochlorothiazide the morning of the procedure   Continue your anticoagulant: Xarelto  You will need to continue your anticoagulant after your procedure until you  are told by your provider that it is safe to stop   Labs:  Your provider would like for you to return January 3rd to have the following labs drawn: CBC and BMET. You do not need an appointment for the lab. Once in our office lobby there is a podium where you can sign in and ring the doorbell to alert [place] that you are here. The lab is open from 8:00 am to 4:30 pm; closed for lunch from 12:45pm-1:45pm.   You must have a responsible person to drive you home and stay in the waiting area during your procedure. Failure to do so could result in cancellation.  Bring your insurance cards.  *Special Note: Every effort is made to have your procedure done on time. Occasionally there are emergencies that occur at the hospital that may cause delays. Please be patient if a  delay does occur.

## 2021-11-25 ENCOUNTER — Telehealth (HOSPITAL_COMMUNITY): Payer: Self-pay | Admitting: *Deleted

## 2021-11-25 NOTE — Telephone Encounter (Signed)
Patient called to afib clinic to adjust his appt later in the month due to a conflict. While on the phone he stated he is having issues with Xarelto and stopped taking it 2 days ago due to "his whole body being on fire and itching" 30 minutes after taking the medication. The last 2 days he has not had that sensation. Instructed pt I will forward to Dr. Elinor Dodge to determine replacement DOAC since patient has not been seen in clinic yet. Pt appreciative of advice and will await call from Dr. Elinor Dodge for replacement. Pt is currently scheduled for DCCV on 1/10. He has follow up here on 1/5.  Pt is best reached at [phone].

## 2021-11-26 MED ORDER — APIXABAN 5 MG PO TABS: 5.0000 mg | ORAL_TABLET | Freq: Two times a day (BID) | ORAL | 3 refills | Status: DC

## 2021-11-26 NOTE — Telephone Encounter (Signed)
Pt notified per Dr Loletha Grayer - to stop xarelto and start Eliquis 5mg  twice a day. Stressed importance of compliance of DOAC as cardioversion is scheduled for 30 days from now. Pt verbalized understanding.

## 2021-12-07 ENCOUNTER — Other Ambulatory Visit: Payer: Self-pay

## 2021-12-07 ENCOUNTER — Ambulatory Visit (HOSPITAL_COMMUNITY): Payer: PPO | Attending: Cardiovascular Disease

## 2021-12-07 DIAGNOSIS — I4819 Other persistent atrial fibrillation: Secondary | ICD-10-CM | POA: Insufficient documentation

## 2021-12-07 LAB — ECHOCARDIOGRAM COMPLETE: S' Lateral: 4.5 cm

## 2021-12-07 MED ORDER — METOPROLOL SUCCINATE ER 50 MG PO TB24: 50.0000 mg | ORAL_TABLET | Freq: Every day | ORAL | 3 refills | Status: DC

## 2021-12-07 NOTE — Progress Notes (Unsigned)
Discussed findings on echo.  Switching from diltiazem to Toprol-XL.

## 2021-12-16 ENCOUNTER — Encounter (HOSPITAL_COMMUNITY): Payer: Self-pay | Admitting: Cardiovascular Disease

## 2021-12-16 ENCOUNTER — Ambulatory Visit (HOSPITAL_COMMUNITY): Payer: PPO | Admitting: Physician Assistant

## 2021-12-23 ENCOUNTER — Ambulatory Visit (HOSPITAL_COMMUNITY)
Admission: RE | Admit: 2021-12-23 | Discharge: 2021-12-23 | Disposition: A | Discharge status: Home / Self Care | Payer: PPO | Source: Ambulatory Visit | Attending: Physician Assistant | Admitting: Physician Assistant

## 2021-12-23 ENCOUNTER — Other Ambulatory Visit: Payer: Self-pay

## 2021-12-23 VITALS — BP 132/74 | HR 115 | Ht 66.0 in | Wt 150.2 lb

## 2021-12-23 DIAGNOSIS — D6869 Other thrombophilia: Secondary | ICD-10-CM | POA: Diagnosis not present

## 2021-12-23 DIAGNOSIS — I5022 Chronic systolic (congestive) heart failure: Secondary | ICD-10-CM | POA: Diagnosis not present

## 2021-12-23 DIAGNOSIS — I110 Hypertensive heart disease with heart failure: Secondary | ICD-10-CM | POA: Insufficient documentation

## 2021-12-23 DIAGNOSIS — Z7901 Long term (current) use of anticoagulants: Secondary | ICD-10-CM | POA: Insufficient documentation

## 2021-12-23 DIAGNOSIS — I4819 Other persistent atrial fibrillation: Secondary | ICD-10-CM | POA: Diagnosis not present

## 2021-12-23 LAB — CBC
HCT: 39.4 % (ref 39.0–52.0)
Hemoglobin: 13.2 g/dL (ref 13.0–17.0)
MCH: 29.9 pg (ref 26.0–34.0)
MCHC: 33.5 g/dL (ref 30.0–36.0)
MCV: 89.1 fL (ref 80.0–100.0)
Platelets: 103 10*3/uL — ABNORMAL LOW (ref 150–400)
RBC: 4.42 MIL/uL (ref 4.22–5.81)
RDW: 14.3 % (ref 11.5–15.5)
WBC: 8.7 10*3/uL (ref 4.0–10.5)
nRBC: 0 % (ref 0.0–0.2)

## 2021-12-23 LAB — BASIC METABOLIC PANEL
Anion gap: 6 (ref 5–15)
BUN: 24 mg/dL — ABNORMAL HIGH (ref 8–23)
CO2: 29 mmol/L (ref 22–32)
Calcium: 8.6 mg/dL — ABNORMAL LOW (ref 8.9–10.3)
Chloride: 108 mmol/L (ref 98–111)
Creatinine, Ser: 1.06 mg/dL (ref 0.61–1.24)
GFR, Estimated: >60 mL/min (ref >60)
Glucose, Bld: 165 mg/dL — ABNORMAL HIGH (ref 70–99)
Potassium: 4.6 mmol/L (ref 3.5–5.1)
Sodium: 143 mmol/L (ref 135–145)

## 2021-12-23 NOTE — Progress Notes (Addendum)
Primary Care Physician: Pcp, No Primary Cardiologist: Dr Sallyanne Kuster  Primary Electrophysiologist: none Referring Physician: Dr Ulice Dash is a 76 y.o. male with a history of HTN, chronic systolic CHF, and atrial fibrillation who presents for consultation in the [facility]. The patient was initially diagnosed with atrial fibrillation 10/28/21 after presenting to the ED with symptoms of SOB, ECG showed afib with RVR. His heart rate was slowed with IV diltiazem and he was started on Xarelto for a CHADS2VASC score of 3. He did not tolerate Xarelto due to "his whole body being on fire and itching." He was changed to Eliquis. Echo showed EF 35-40% and diltiazem was changed to metoprolol. Patient does feel that over the last few months he has had increased SOB with activity and weakness in his legs.   Today, he denies symptoms of palpitations, chest pain, orthopnea, PND, lower extremity edema, dizziness, presyncope, syncope, snoring, daytime somnolence, bleeding, or neurologic sequela. The patient is tolerating medications without difficulties and is otherwise without complaint today.    Atrial Fibrillation Risk Factors:  he does not have symptoms or diagnosis of sleep apnea. he does not have a history of rheumatic fever.   he has a BMI of Body mass index is 24.24 kg/m.Marland Kitchen Filed Weights   12/23/21 1121  Weight: 68.1 kg    Family History  Problem Relation Age of Onset   COPD Brother      Atrial Fibrillation Management history:  Previous antiarrhythmic drugs: none Previous cardioversions: none Previous ablations: none CHADS2VASC score: 3 Anticoagulation history: Eliquis   Past Medical History:  Diagnosis Date   Hypertension    Past Surgical History:  Procedure Laterality Date   CIRCUMCISION     TONSILLECTOMY AND ADENOIDECTOMY      Current Outpatient Medications  Medication Sig Dispense Refill   apixaban (ELIQUIS) 5 MG TABS  tablet Take 1 tablet (5 mg total) by mouth 2 (two) times daily. 60 tablet 3   atorvastatin (LIPITOR) 40 MG tablet Take 40 mg by mouth daily.     benzonatate (TESSALON) 200 MG capsule Take 200 mg by mouth 3 (three) times daily as needed for cough.     Calcium-Magnesium-Zinc (CAL-MAG-ZINC PO) Take 2 tablets by mouth daily.     ibuprofen (ADVIL) 200 MG tablet Take 400 mg by mouth as needed for moderate pain.     losartan-hydrochlorothiazide (HYZAAR) 100-12.5 MG tablet TAKE 1 TABLET BY MOUTH EVERY DAY FOR BLOOD PRESSURE     metoprolol succinate (TOPROL-XL) 50 MG 24 hr tablet Take 1 tablet (50 mg total) by mouth daily. Take with or immediately following a meal. 90 tablet 3   Multiple Vitamins-Minerals (AIRBORNE PO) Take 1 tablet by mouth daily.     sildenafil (VIAGRA) 25 MG tablet Take 12.5 mg by mouth as needed for erectile dysfunction.     tamsulosin (FLOMAX) 0.4 MG CAPS capsule TAKE 1 CAPSULE BY MOUTH DAILY FOR PROSTATE     No current facility-administered medications for this encounter.    Allergies  Allergen Reactions   Cheese Hives   Bee Venom Swelling   Chocolate Hives    In large quantities    Other Itching    Buttermilk    Wasp Venom Itching    Social History   Socioeconomic History   Marital status: Widowed    Spouse name: Not on file   Number of children: Not on file   Years of education: Not on file  Highest education level: Not on file  Occupational History   Not on file  Tobacco Use   Smoking status: Former    Types: Cigarettes   Smokeless tobacco: Not on file  Substance and Sexual Activity   Alcohol use: Not Currently   Drug use: Not Currently   Sexual activity: Not on file  Other Topics Concern   Not on file  Social History Narrative   Not on file   Social Determinants of Health   Financial Resource Strain: Not on file  Food Insecurity: Not on file  Transportation Needs: Not on file  Physical Activity: Not on file  Stress: Not on file  Social  Connections: Not on file  Intimate Partner Violence: Not on file     ROS- All systems are reviewed and negative except as per the HPI above.  Physical Exam: Vitals:   12/23/21 1121  BP: 132/74  Pulse: (!) 115  Weight: 68.1 kg  Height: 5\' 6"  (1.676 m)    GEN- The patient is a well appearing elderly male, alert and oriented x 3 today.   Head- normocephalic, atraumatic Eyes-  Sclera clear, conjunctiva pink Ears- hearing intact Oropharynx- clear Neck- supple  Lungs- Clear to ausculation bilaterally, normal work of breathing Heart- irregular rate and rhythm, no murmurs, rubs or gallops  GI- soft, NT, ND, + BS Extremities- no clubbing, cyanosis, or edema MS- no significant deformity or atrophy Skin- no rash or lesion Psych- euthymic mood, full affect Neuro- strength and sensation are intact  Wt Readings from Last 3 Encounters:  12/23/21 68.1 kg  11/16/21 67.6 kg  10/28/21 68 kg    EKG today demonstrates  Coarse Afib  Vent. rate 115 BPM PR interval * ms QRS duration 90 ms QT/QTcB 326/450 ms  Echo 12/07/21 demonstrated  1. Left ventricular ejection fraction, by estimation, is 35 to 40%. The  left ventricle has moderately decreased function. The left ventricle  demonstrates global hypokinesis. Left ventricular diastolic parameters are indeterminate.   2. Right ventricular systolic function is normal. The right ventricular  size is normal.   3. Left atrial size was mildly dilated.   4. The mitral valve is normal in structure. Trivial mitral valve  regurgitation. No evidence of mitral stenosis.   5. The aortic valve is normal in structure. Aortic valve regurgitation is not visualized. No aortic stenosis is present.   6. The inferior vena cava is normal in size with greater than 50%  respiratory variability, suggesting right atrial pressure of 3 mmHg.   Epic records are reviewed at length today  CHA2DS2-VASc Score = 3  The patient's score is based upon: CHF History:  1 HTN History: 1 Diabetes History: 0 Stroke History: 0 Vascular Disease History: 0 Age Score: 1 Gender Score: 0        ASSESSMENT AND PLAN: 1. Persistent Atrial Fibrillation (ICD10:  I48.19) The patient's CHA2DS2-VASc score is 3, indicating a 3.2% annual risk of stroke.   DCCV scheduled for 12/28/21, risks and benefits of procedure reviewed.  Heart rate better on recheck in office. Check bmet/cbc Continue Toprol 50 mg daily Continue Eliquis 5 mg BID, patient denies any missed doses in the last 3 weeks.   2. Secondary Hypercoagulable State (ICD10:  D68.69) The patient is at significant risk for stroke/thromboembolism based upon his CHA2DS2-VASc Score of 3.  Continue Apixaban (Eliquis).   3. 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 Appears euvolemic today. Will plan to recheck echo ~ 3 months post conversion to SR.  4. HTN Stable, no changes today.   Follow up in the AF clinic post DCCV.    [facility] [address] [place], [place] [address] [phone] 12/23/2021 12:20 PM

## 2021-12-23 NOTE — H&P (View-Only) (Signed)
Primary Care Physician: Pcp, No Primary Cardiologist: Dr Sallyanne Kuster  Primary Electrophysiologist: none Referring Physician: Dr Ulice Dash is a 76 y.o. male with a history of HTN, chronic systolic CHF, and atrial fibrillation who presents for consultation in the [facility]. The patient was initially diagnosed with atrial fibrillation 10/28/21 after presenting to the ED with symptoms of SOB, ECG showed afib with RVR. His heart rate was slowed with IV diltiazem and he was started on Xarelto for a CHADS2VASC score of 3. He did not tolerate Xarelto due to "his whole body being on fire and itching." He was changed to Eliquis. Echo showed EF 35-40% and diltiazem was changed to metoprolol. Patient does feel that over the last few months he has had increased SOB with activity and weakness in his legs.   Today, he denies symptoms of palpitations, chest pain, orthopnea, PND, lower extremity edema, dizziness, presyncope, syncope, snoring, daytime somnolence, bleeding, or neurologic sequela. The patient is tolerating medications without difficulties and is otherwise without complaint today.    Atrial Fibrillation Risk Factors:  he does not have symptoms or diagnosis of sleep apnea. he does not have a history of rheumatic fever.   he has a BMI of Body mass index is 24.24 kg/m.Marland Kitchen Filed Weights   12/23/21 1121  Weight: 68.1 kg    Family History  Problem Relation Age of Onset   COPD Brother      Atrial Fibrillation Management history:  Previous antiarrhythmic drugs: none Previous cardioversions: none Previous ablations: none CHADS2VASC score: 3 Anticoagulation history: Eliquis   Past Medical History:  Diagnosis Date   Hypertension    Past Surgical History:  Procedure Laterality Date   CIRCUMCISION     TONSILLECTOMY AND ADENOIDECTOMY      Current Outpatient Medications  Medication Sig Dispense Refill   apixaban (ELIQUIS) 5 MG TABS  tablet Take 1 tablet (5 mg total) by mouth 2 (two) times daily. 60 tablet 3   atorvastatin (LIPITOR) 40 MG tablet Take 40 mg by mouth daily.     benzonatate (TESSALON) 200 MG capsule Take 200 mg by mouth 3 (three) times daily as needed for cough.     Calcium-Magnesium-Zinc (CAL-MAG-ZINC PO) Take 2 tablets by mouth daily.     ibuprofen (ADVIL) 200 MG tablet Take 400 mg by mouth as needed for moderate pain.     losartan-hydrochlorothiazide (HYZAAR) 100-12.5 MG tablet TAKE 1 TABLET BY MOUTH EVERY DAY FOR BLOOD PRESSURE     metoprolol succinate (TOPROL-XL) 50 MG 24 hr tablet Take 1 tablet (50 mg total) by mouth daily. Take with or immediately following a meal. 90 tablet 3   Multiple Vitamins-Minerals (AIRBORNE PO) Take 1 tablet by mouth daily.     sildenafil (VIAGRA) 25 MG tablet Take 12.5 mg by mouth as needed for erectile dysfunction.     tamsulosin (FLOMAX) 0.4 MG CAPS capsule TAKE 1 CAPSULE BY MOUTH DAILY FOR PROSTATE     No current facility-administered medications for this encounter.    Allergies  Allergen Reactions   Cheese Hives   Bee Venom Swelling   Chocolate Hives    In large quantities    Other Itching    Buttermilk    Wasp Venom Itching    Social History   Socioeconomic History   Marital status: Widowed    Spouse name: Not on file   Number of children: Not on file   Years of education: Not on file  Highest education level: Not on file  Occupational History   Not on file  Tobacco Use   Smoking status: Former    Types: Cigarettes   Smokeless tobacco: Not on file  Substance and Sexual Activity   Alcohol use: Not Currently   Drug use: Not Currently   Sexual activity: Not on file  Other Topics Concern   Not on file  Social History Narrative   Not on file   Social Determinants of Health   Financial Resource Strain: Not on file  Food Insecurity: Not on file  Transportation Needs: Not on file  Physical Activity: Not on file  Stress: Not on file  Social  Connections: Not on file  Intimate Partner Violence: Not on file     ROS- All systems are reviewed and negative except as per the HPI above.  Physical Exam: Vitals:   12/23/21 1121  BP: 132/74  Pulse: (!) 115  Weight: 68.1 kg  Height: 5\' 6"  (1.676 m)    GEN- The patient is a well appearing elderly male, alert and oriented x 3 today.   Head- normocephalic, atraumatic Eyes-  Sclera clear, conjunctiva pink Ears- hearing intact Oropharynx- clear Neck- supple  Lungs- Clear to ausculation bilaterally, normal work of breathing Heart- irregular rate and rhythm, no murmurs, rubs or gallops  GI- soft, NT, ND, + BS Extremities- no clubbing, cyanosis, or edema MS- no significant deformity or atrophy Skin- no rash or lesion Psych- euthymic mood, full affect Neuro- strength and sensation are intact  Wt Readings from Last 3 Encounters:  12/23/21 68.1 kg  11/16/21 67.6 kg  10/28/21 68 kg    EKG today demonstrates  Coarse Afib  Vent. rate 115 BPM PR interval * ms QRS duration 90 ms QT/QTcB 326/450 ms  Echo 12/07/21 demonstrated  1. Left ventricular ejection fraction, by estimation, is 35 to 40%. The  left ventricle has moderately decreased function. The left ventricle  demonstrates global hypokinesis. Left ventricular diastolic parameters are indeterminate.   2. Right ventricular systolic function is normal. The right ventricular  size is normal.   3. Left atrial size was mildly dilated.   4. The mitral valve is normal in structure. Trivial mitral valve  regurgitation. No evidence of mitral stenosis.   5. The aortic valve is normal in structure. Aortic valve regurgitation is not visualized. No aortic stenosis is present.   6. The inferior vena cava is normal in size with greater than 50%  respiratory variability, suggesting right atrial pressure of 3 mmHg.   Epic records are reviewed at length today  CHA2DS2-VASc Score = 3  The patient's score is based upon: CHF History:  1 HTN History: 1 Diabetes History: 0 Stroke History: 0 Vascular Disease History: 0 Age Score: 1 Gender Score: 0        ASSESSMENT AND PLAN: 1. Persistent Atrial Fibrillation (ICD10:  I48.19) The patient's CHA2DS2-VASc score is 3, indicating a 3.2% annual risk of stroke.   DCCV scheduled for 12/28/21, risks and benefits of procedure reviewed.  Heart rate better on recheck in office. Check bmet/cbc Continue Toprol 50 mg daily Continue Eliquis 5 mg BID, patient denies any missed doses in the last 3 weeks.   2. Secondary Hypercoagulable State (ICD10:  D68.69) The patient is at significant risk for stroke/thromboembolism based upon his CHA2DS2-VASc Score of 3.  Continue Apixaban (Eliquis).   3. 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 Appears euvolemic today. Will plan to recheck echo ~ 3 months post conversion to SR.  4. HTN Stable, no changes today.   Follow up in the AF clinic post DCCV.    [facility] [address] [place], [place] [address] [phone] 12/23/2021 12:20 PM

## 2021-12-23 NOTE — Patient Instructions (Addendum)
Cardioversion scheduled for Tuesday, January 10th  - Arrive at the [facility] and go to admitting at 630AM  - Do not eat or drink anything after midnight the night prior to your procedure.  - Take all your morning medication (except diabetic medications) with a sip of water prior to arrival.  - You will not be able to drive home after your procedure.  - Do NOT miss any doses of your blood thinner - if you should miss a dose please notify our office immediately.  - If you feel as if you go back into normal rhythm prior to scheduled cardioversion, please notify our office immediately. If your procedure is canceled in the cardioversion suite you will be charged a cancellation fee.

## 2021-12-28 ENCOUNTER — Encounter (HOSPITAL_COMMUNITY): Payer: Self-pay | Admitting: Internal Medicine

## 2021-12-28 ENCOUNTER — Ambulatory Visit (HOSPITAL_COMMUNITY): Payer: PPO | Admitting: Anesthesiology

## 2021-12-28 ENCOUNTER — Other Ambulatory Visit: Payer: Self-pay

## 2021-12-28 ENCOUNTER — Ambulatory Visit (HOSPITAL_COMMUNITY)
Admission: RE | Admit: 2021-12-28 | Discharge: 2021-12-28 | Disposition: A | Discharge status: Home / Self Care | Payer: PPO | Attending: Internal Medicine | Admitting: Internal Medicine

## 2021-12-28 ENCOUNTER — Encounter (HOSPITAL_COMMUNITY)
Admission: RE | Disposition: A | Discharge status: Home / Self Care | Payer: Self-pay | Source: Home / Self Care | Attending: Internal Medicine

## 2021-12-28 DIAGNOSIS — I4819 Other persistent atrial fibrillation: Secondary | ICD-10-CM | POA: Diagnosis not present

## 2021-12-28 DIAGNOSIS — Z87891 Personal history of nicotine dependence: Secondary | ICD-10-CM | POA: Insufficient documentation

## 2021-12-28 DIAGNOSIS — D6869 Other thrombophilia: Secondary | ICD-10-CM | POA: Diagnosis not present

## 2021-12-28 DIAGNOSIS — Z7901 Long term (current) use of anticoagulants: Secondary | ICD-10-CM | POA: Insufficient documentation

## 2021-12-28 DIAGNOSIS — Z79899 Other long term (current) drug therapy: Secondary | ICD-10-CM | POA: Insufficient documentation

## 2021-12-28 DIAGNOSIS — I5022 Chronic systolic (congestive) heart failure: Secondary | ICD-10-CM | POA: Insufficient documentation

## 2021-12-28 DIAGNOSIS — I110 Hypertensive heart disease with heart failure: Secondary | ICD-10-CM | POA: Diagnosis not present

## 2021-12-28 DIAGNOSIS — I4891 Unspecified atrial fibrillation: Secondary | ICD-10-CM

## 2021-12-28 HISTORY — PX: CARDIOVERSION: SHX1299

## 2021-12-28 SURGERY — CARDIOVERSION
Anesthesia: General

## 2021-12-28 MED ORDER — LIDOCAINE HCL (CARDIAC) PF 100 MG/5ML IV SOSY
PREFILLED_SYRINGE | INTRAVENOUS | Status: DC | PRN
  Administered 2021-12-28: 60 mg via INTRAVENOUS

## 2021-12-28 MED ORDER — SODIUM CHLORIDE 0.9 % IV SOLN: INTRAVENOUS | Status: DC

## 2021-12-28 MED ORDER — PROPOFOL 10 MG/ML IV BOLUS
INTRAVENOUS | Status: DC | PRN
  Administered 2021-12-28: 70 mg via INTRAVENOUS

## 2021-12-28 NOTE — Interval H&P Note (Signed)
History and Physical Interval Note:  12/28/2021 7:24 AM  Robert Stark  has presented today for surgery, with the diagnosis of AFIB.  The various methods of treatment have been discussed with the patient and family. After consideration of risks, benefits and other options for treatment, the patient has consented to  Procedure(s): CARDIOVERSION (N/A) as a surgical intervention.  The patient's history has been reviewed, patient examined, no change in status, stable for surgery.  I have reviewed the patient's chart and labs.  Questions were answered to the patient's satisfaction.     Dorris Carnes

## 2021-12-28 NOTE — Discharge Instructions (Signed)

## 2021-12-28 NOTE — CV Procedure (Signed)
Electrical Cardioversion Procedure Note BODEY FRIZELL [id number] 05-23-1946  Procedure: Electrical Cardioversion Indications:  Atrial Fibrillation  Procedure Details Consent: Risks of procedure as well as the alternatives and risks of each were explained to the (patient/caregiver).  Consent for procedure obtained. Time Out: Verified patient identification, verified procedure, site/side was marked, verified correct patient position, special equipment/implants available, medications/allergies/relevent history reviewed, required imaging and test results available.  Performed  Patient placed on cardiac monitor, pulse oximetry, supplemental oxygen as necessary.  Sedation given:  Propofol and lidocaine  Pacer pads placed anterior and posterior chest.  Cardioverted 1 time(s).  Cardioverted at [place].  Evaluation Findings: Post procedure EKG shows: NSR Complications: None Patient did tolerate procedure well.   Dorris Carnes 12/28/2021, 7:42 AM

## 2021-12-28 NOTE — Transfer of Care (Signed)
Immediate Anesthesia Transfer of Care Note  Patient: Robert Stark  Procedure(s) Performed: CARDIOVERSION  Patient Location: Endoscopy Unit  Anesthesia Type:MAC  Level of Consciousness: awake  Airway & Oxygen Therapy: Patient Spontanous Breathing  Post-op Assessment: Report given to RN and Post -op Vital signs reviewed and stable  Post vital signs: Reviewed and stable  Last Vitals:  Vitals Value Taken Time  BP 116/66 12/28/21 0745  Temp    Pulse 58 12/28/21 0745  Resp 20 12/28/21 0745  SpO2 98 % 12/28/21 0745  Vitals shown include unvalidated device data.  Last Pain:  Vitals:   12/28/21 0705  TempSrc: Temporal  PainSc: 0-No pain         Complications: No notable events documented.

## 2021-12-28 NOTE — Anesthesia Procedure Notes (Signed)
Procedure Name: MAC Date/Time: 12/28/2021 7:42 AM Performed by: Lieutenant Diego, CRNA Pre-anesthesia Checklist: Patient identified, Emergency Drugs available, Suction available, Patient being monitored and Timeout performed Patient Re-evaluated:Patient Re-evaluated prior to induction Oxygen Delivery Method: Circle system utilized Preoxygenation: Pre-oxygenation with 100% oxygen Induction Type: IV induction

## 2021-12-28 NOTE — Anesthesia Preprocedure Evaluation (Signed)
Anesthesia Evaluation  Patient identified by MRN, date of birth, ID band Patient awake    Reviewed: Allergy & Precautions, H&P , NPO status , Patient's Chart, lab work & pertinent test results  Airway Mallampati: II   Neck ROM: full    Dental   Pulmonary former smoker,    breath sounds clear to auscultation       Cardiovascular hypertension, + dysrhythmias Atrial Fibrillation  Rhythm:irregular Rate:Normal     Neuro/Psych    GI/Hepatic   Endo/Other    Renal/GU      Musculoskeletal   Abdominal   Peds  Hematology   Anesthesia Other Findings   Reproductive/Obstetrics                             Anesthesia Physical Anesthesia Plan  ASA: 3  Anesthesia Plan: General   Post-op Pain Management:    Induction: Intravenous  PONV Risk Score and Plan: 2 and Propofol infusion and Treatment may vary due to age or medical condition  Airway Management Planned: Mask  Additional Equipment:   Intra-op Plan:   Post-operative Plan:   Informed Consent: I have reviewed the patients History and Physical, chart, labs and discussed the procedure including the risks, benefits and alternatives for the proposed anesthesia with the patient or authorized representative who has indicated his/her understanding and acceptance.     Dental advisory given  Plan Discussed with: CRNA, Anesthesiologist and Surgeon  Anesthesia Plan Comments:         Anesthesia Quick Evaluation

## 2021-12-29 ENCOUNTER — Encounter (HOSPITAL_COMMUNITY): Payer: Self-pay | Admitting: Internal Medicine

## 2021-12-29 NOTE — Anesthesia Postprocedure Evaluation (Signed)
Anesthesia Post Note  Patient: Robert Stark  Procedure(s) Performed: CARDIOVERSION     Patient location during evaluation: Endoscopy Anesthesia Type: General Level of consciousness: awake and alert Pain management: pain level controlled Vital Signs Assessment: post-procedure vital signs reviewed and stable Respiratory status: spontaneous breathing, nonlabored ventilation, respiratory function stable and patient connected to nasal cannula oxygen Cardiovascular status: blood pressure returned to baseline and stable Postop Assessment: no apparent nausea or vomiting Anesthetic complications: no   No notable events documented.  Last Vitals:  Vitals:   12/28/21 0810 12/28/21 0820  BP: (!) 133/91 (!) 137/96  Pulse: 63 (!) 59  Resp: (!) 22 (!) 22  Temp:    SpO2: 97% 98%    Last Pain:  Vitals:   12/29/21 1516  TempSrc:   PainSc: 0-No pain                 Kayda Allers S

## 2022-01-11 ENCOUNTER — Other Ambulatory Visit (HOSPITAL_COMMUNITY): Payer: Self-pay

## 2022-01-11 ENCOUNTER — Encounter (HOSPITAL_COMMUNITY): Payer: Self-pay | Admitting: Physician Assistant

## 2022-01-11 ENCOUNTER — Other Ambulatory Visit: Payer: Self-pay

## 2022-01-11 ENCOUNTER — Ambulatory Visit (HOSPITAL_COMMUNITY)
Admission: RE | Admit: 2022-01-11 | Discharge: 2022-01-11 | Disposition: A | Discharge status: Home / Self Care | Payer: PPO | Source: Ambulatory Visit | Attending: Physician Assistant | Admitting: Physician Assistant

## 2022-01-11 VITALS — BP 118/82 | HR 130 | Ht 66.0 in | Wt 151.0 lb

## 2022-01-11 DIAGNOSIS — Z7901 Long term (current) use of anticoagulants: Secondary | ICD-10-CM | POA: Insufficient documentation

## 2022-01-11 DIAGNOSIS — D6869 Other thrombophilia: Secondary | ICD-10-CM | POA: Diagnosis not present

## 2022-01-11 DIAGNOSIS — I5022 Chronic systolic (congestive) heart failure: Secondary | ICD-10-CM | POA: Diagnosis not present

## 2022-01-11 DIAGNOSIS — I4819 Other persistent atrial fibrillation: Secondary | ICD-10-CM | POA: Diagnosis present

## 2022-01-11 DIAGNOSIS — I110 Hypertensive heart disease with heart failure: Secondary | ICD-10-CM | POA: Diagnosis not present

## 2022-01-11 MED ORDER — APIXABAN 5 MG PO TABS: 5.0000 mg | ORAL_TABLET | Freq: Two times a day (BID) | ORAL | 3 refills | Status: DC

## 2022-01-11 MED ORDER — METOPROLOL SUCCINATE ER 50 MG PO TB24: 50.0000 mg | ORAL_TABLET | Freq: Two times a day (BID) | ORAL | 2 refills | Status: DC

## 2022-01-11 NOTE — Patient Instructions (Signed)
Increase metoprolol to 50mg  TWICE A DAY  Start taking Eliquis 5mg  TWICE A DAY

## 2022-01-11 NOTE — Progress Notes (Signed)
Primary Care Physician: [name], DO Primary Cardiologist: Dr Sallyanne Kuster  Primary Electrophysiologist: none Referring Physician: Dr Ulice Dash is a 76 y.o. male with a history of HTN, chronic systolic CHF, and atrial fibrillation who presents for follow up in the [facility]. The patient was initially diagnosed with atrial fibrillation 10/28/21 after presenting to the ED with symptoms of SOB, ECG showed afib with RVR. His heart rate was slowed with IV diltiazem and he was started on Xarelto for a CHADS2VASC score of 3. He did not tolerate Xarelto due to "his whole body being on fire and itching." He was changed to Eliquis. Echo showed EF 35-40% and diltiazem was changed to metoprolol. Patient does feel that over the last few months he has had increased SOB with activity and weakness in his legs.   On follow up today, patient is s/p DCCV on 12/28/21. He reports that he feels "great". Unfortunately, he is back in afib with rapid rates. There were no specific triggers that he could identify. Of note, since DCCV he has only been taking Eliquis once daily.   Today, he denies symptoms of palpitations, chest pain, orthopnea, PND, lower extremity edema, dizziness, presyncope, syncope, snoring, daytime somnolence, bleeding, or neurologic sequela. The patient is tolerating medications without difficulties and is otherwise without complaint today.    Atrial Fibrillation Risk Factors:  he does not have symptoms or diagnosis of sleep apnea. he does not have a history of rheumatic fever.   he has a BMI of Body mass index is 24.37 kg/m.Marland Kitchen Filed Weights   01/11/22 1028  Weight: 68.5 kg     Family History  Problem Relation Age of Onset   COPD Brother      Atrial Fibrillation Management history:  Previous antiarrhythmic drugs: none Previous cardioversions: 12/28/21 Previous ablations: none CHADS2VASC score: 3 Anticoagulation history:  Eliquis   Past Medical History:  Diagnosis Date   Hypertension    Past Surgical History:  Procedure Laterality Date   CARDIOVERSION N/A 12/28/2021   Procedure: CARDIOVERSION;  Surgeon: [name], MD;  Location: [facility] ENDOSCOPY;  Service: Cardiovascular;  Laterality: N/A;   CIRCUMCISION     TONSILLECTOMY AND ADENOIDECTOMY      Current Outpatient Medications  Medication Sig Dispense Refill   atorvastatin (LIPITOR) 40 MG tablet Take 40 mg by mouth daily.     benzonatate (TESSALON) 200 MG capsule Take 200 mg by mouth 3 (three) times daily as needed for cough.     Calcium-Magnesium-Zinc (CAL-MAG-ZINC PO) Take 2 tablets by mouth daily.     ibuprofen (ADVIL) 200 MG tablet Take 400 mg by mouth as needed for moderate pain.     losartan-hydrochlorothiazide (HYZAAR) 100-12.5 MG tablet TAKE 1 TABLET BY MOUTH EVERY DAY FOR BLOOD PRESSURE     Multiple Vitamins-Minerals (AIRBORNE PO) Take 1 tablet by mouth daily.     sildenafil (VIAGRA) 25 MG tablet Take 12.5 mg by mouth as needed for erectile dysfunction.     tamsulosin (FLOMAX) 0.4 MG CAPS capsule TAKE 1 CAPSULE BY MOUTH DAILY FOR PROSTATE     apixaban (ELIQUIS) 5 MG TABS tablet Take 1 tablet (5 mg total) by mouth 2 (two) times daily. 60 tablet 3   metoprolol succinate (TOPROL-XL) 50 MG 24 hr tablet Take 1 tablet (50 mg total) by mouth 2 (two) times daily. Take with or immediately following a meal. 180 tablet 2   No current facility-administered medications for this encounter.  Allergies  Allergen Reactions   Cheese Hives   Bee Venom Swelling   Chocolate Hives    In large quantities    Other Itching    Buttermilk    Wasp Venom Itching    Social History   Socioeconomic History   Marital status: Widowed    Spouse name: Not on file   Number of children: Not on file   Years of education: Not on file   Highest education level: Not on file  Occupational History   Not on file  Tobacco Use   Smoking status: Former    Types:  Cigarettes   Smokeless tobacco: Not on file   Tobacco comments:    Former smoker 01/11/22  Substance and Sexual Activity   Alcohol use: Not Currently   Drug use: Not Currently   Sexual activity: Not on file  Other Topics Concern   Not on file  Social History Narrative   Not on file   Social Determinants of Health   Financial Resource Strain: Not on file  Food Insecurity: Not on file  Transportation Needs: Not on file  Physical Activity: Not on file  Stress: Not on file  Social Connections: Not on file  Intimate Partner Violence: Not on file     ROS- All systems are reviewed and negative except as per the HPI above.  Physical Exam: Vitals:   01/11/22 1028  BP: 118/82  Pulse: (!) 130  Weight: 68.5 kg  Height: 5\' 6"  (1.676 m)    GEN- The patient is a well appearing elderly male, alert and oriented x 3 today.   HEENT-head normocephalic, atraumatic, sclera clear, conjunctiva pink, hearing intact, trachea midline. Lungs- Clear to ausculation bilaterally, normal work of breathing Heart- irregular rate and rhythm, tachycardia, no murmurs, rubs or gallops  GI- soft, NT, ND, + BS Extremities- no clubbing, cyanosis, or edema MS- no significant deformity or atrophy Skin- no rash or lesion Psych- euthymic mood, full affect Neuro- strength and sensation are intact   Wt Readings from Last 3 Encounters:  01/11/22 68.5 kg  12/28/21 68.1 kg  12/23/21 68.1 kg    EKG today demonstrates  Afib with RVR Vent. rate 130 BPM PR interval * ms QRS duration 94 ms QT/QTcB 322/473 ms  Echo 12/07/21 demonstrated  1. Left ventricular ejection fraction, by estimation, is 35 to 40%. The  left ventricle has moderately decreased function. The left ventricle  demonstrates global hypokinesis. Left ventricular diastolic parameters are indeterminate.   2. Right ventricular systolic function is normal. The right ventricular  size is normal.   3. Left atrial size was mildly dilated.   4. The  mitral valve is normal in structure. Trivial mitral valve  regurgitation. No evidence of mitral stenosis.   5. The aortic valve is normal in structure. Aortic valve regurgitation is not visualized. No aortic stenosis is present.   6. The inferior vena cava is normal in size with greater than 50%  respiratory variability, suggesting right atrial pressure of 3 mmHg.   Epic records are reviewed at length today  CHA2DS2-VASc Score = 3  The patient's score is based upon: CHF History: 1 HTN History: 1 Diabetes History: 0 Stroke History: 0 Vascular Disease History: 0 Age Score: 1 Gender Score: 0        ASSESSMENT AND PLAN: 1. Persistent Atrial Fibrillation (ICD10:  I48.19) The patient's CHA2DS2-VASc score is 3, indicating a 3.2% annual risk of stroke.   S/p DCCV on 12/28/21 with early return of  afib. We discussed rhythm control options today including AAD (dofetilide, amio) vs ablation. He would like to discuss ablation with EP. 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  Increase Toprol to 50 mg BID Continue Eliquis 5 mg BID, stressed importance of taking this twice daily.   2. Secondary Hypercoagulable State (ICD10:  D68.69) The patient is at significant risk for stroke/thromboembolism based upon his CHA2DS2-VASc Score of 3.  Continue Apixaban (Eliquis).   3. 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 Will plan to recheck echo once back in SR.  4. HTN Stable, no changes today.   Follow up in the AF clinic next week to evaluate rate control. Referred to EP for ablation consideration.    [facility] [address] [place], [place] [address] [phone] 01/11/2022 1:17 PM

## 2022-01-20 ENCOUNTER — Ambulatory Visit (HOSPITAL_COMMUNITY)
Admission: RE | Admit: 2022-01-20 | Discharge: 2022-01-20 | Disposition: A | Discharge status: Home / Self Care | Payer: PPO | Source: Ambulatory Visit | Attending: Physician Assistant | Admitting: Physician Assistant

## 2022-01-20 ENCOUNTER — Other Ambulatory Visit: Payer: Self-pay

## 2022-01-20 DIAGNOSIS — I4891 Unspecified atrial fibrillation: Secondary | ICD-10-CM | POA: Diagnosis present

## 2022-01-20 DIAGNOSIS — Z79899 Other long term (current) drug therapy: Secondary | ICD-10-CM | POA: Diagnosis not present

## 2022-01-20 DIAGNOSIS — I4819 Other persistent atrial fibrillation: Secondary | ICD-10-CM

## 2022-01-20 NOTE — Progress Notes (Signed)
Patient returns for ECG after increasing metoprolol. ECG shows afib HR 81, QRS 96, QTc 436. His heart rate is much better controlled. States he feels "great" today. Follow up with Dr Quentin Ore as scheduled.

## 2022-02-22 ENCOUNTER — Encounter: Payer: Self-pay | Admitting: Cardiology

## 2022-02-22 ENCOUNTER — Encounter: Payer: Self-pay | Admitting: *Deleted

## 2022-02-22 ENCOUNTER — Other Ambulatory Visit: Payer: Self-pay

## 2022-02-22 ENCOUNTER — Ambulatory Visit: Payer: PPO | Admitting: Cardiology

## 2022-02-22 VITALS — BP 118/74 | HR 98 | Ht 66.0 in | Wt 149.6 lb

## 2022-02-22 DIAGNOSIS — I4819 Other persistent atrial fibrillation: Secondary | ICD-10-CM

## 2022-02-22 DIAGNOSIS — I483 Typical atrial flutter: Secondary | ICD-10-CM | POA: Diagnosis not present

## 2022-02-22 DIAGNOSIS — I5022 Chronic systolic (congestive) heart failure: Secondary | ICD-10-CM

## 2022-02-22 DIAGNOSIS — Z01818 Encounter for other preprocedural examination: Secondary | ICD-10-CM | POA: Diagnosis not present

## 2022-02-22 DIAGNOSIS — I4891 Unspecified atrial fibrillation: Secondary | ICD-10-CM

## 2022-02-22 MED ORDER — METOPROLOL TARTRATE 25 MG PO TABS: 25.0000 mg | ORAL_TABLET | Freq: Once | ORAL | 0 refills | Status: DC | PRN

## 2022-02-22 MED ORDER — METOPROLOL SUCCINATE ER 50 MG PO TB24: 50.0000 mg | ORAL_TABLET | Freq: Two times a day (BID) | ORAL | 3 refills | Status: DC

## 2022-02-22 NOTE — Progress Notes (Signed)
Electrophysiology Office Note:    Date:  02/22/2022   ID:  Robert Stark, DOB [birth year], MRN [id number]  PCP:  [name], DO  [facility] Cardiologist:  None  [facility] Electrophysiologist:  None   Referring MD: Oliver Barre, PA   Chief Complaint: Consult for Afib ablation  History of Present Illness:    Robert Stark is a 76 y.o. male who presents for an evaluation of possible ablation at the request of Adline Peals, NP. Their medical history includes atrial fibrillation (29/5188), chronic systolic CHF, and hypertension.   He was last seen by Adline Peals, NP on 01/11/2022. He was s/p DCCV as of 12/28/21. He was feeling well, but was noted to be back in Afib with rapid rates (130 bpm). Since his DCCV he had only been taking Eliquis once daily. Rhythm control options were discussed including dofetilide, amiodarone, and ablation. He was amenable to a consult with EP to discuss ablation. 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 Toprol was increased to 50 mg BID.   Initially seen in cardiology by Dr. Sallyanne Kuster 10/2021.  Overall, he is feeling okay. He remains compliant with Eliquis twice daily. Currently he is not taking metoprolol, he states this was discontinued.  He denies any palpitations, chest pain, shortness of breath, or peripheral edema. No lightheadedness, headaches, syncope, orthopnea, or PND.     Past Medical History:  Diagnosis Date   Hypertension     Past Surgical History:  Procedure Laterality Date   CARDIOVERSION N/A 12/28/2021   Procedure: CARDIOVERSION;  Surgeon: [name], MD;  Location: [facility] ENDOSCOPY;  Service: Cardiovascular;  Laterality: N/A;   CIRCUMCISION     TONSILLECTOMY AND ADENOIDECTOMY      Current Medications: Current Meds  Medication Sig   apixaban (ELIQUIS) 5 MG TABS tablet Take 1 tablet (5 mg total) by mouth 2 (two) times daily.   atorvastatin (LIPITOR) 40 MG tablet Take 40 mg by  mouth daily.   benzonatate (TESSALON) 200 MG capsule Take 200 mg by mouth 3 (three) times daily as needed for cough.   Calcium-Magnesium-Zinc (CAL-MAG-ZINC PO) Take 2 tablets by mouth daily.   ibuprofen (ADVIL) 200 MG tablet Take 400 mg by mouth as needed for moderate pain.   losartan-hydrochlorothiazide (HYZAAR) 100-12.5 MG tablet TAKE 1 TABLET BY MOUTH EVERY DAY FOR BLOOD PRESSURE   metoprolol tartrate (LOPRESSOR) 25 MG tablet Take 1 tablet (25 mg total) by mouth Once PRN for up to 1 dose (If your heart rate is over 80 two hour prior to CT scan take.).   Multiple Vitamins-Minerals (AIRBORNE PO) Take 1 tablet by mouth daily.   sildenafil (VIAGRA) 25 MG tablet Take 12.5 mg by mouth as needed for erectile dysfunction.   tamsulosin (FLOMAX) 0.4 MG CAPS capsule TAKE 1 CAPSULE BY MOUTH DAILY FOR PROSTATE   [DISCONTINUED] metoprolol succinate (TOPROL-XL) 50 MG 24 hr tablet Take 1 tablet (50 mg total) by mouth 2 (two) times daily. Take with or immediately following a meal.     Allergies:   Cheese, Bee venom, Chocolate, Other, and Wasp venom   Social History   Socioeconomic History   Marital status: Widowed    Spouse name: Not on file   Number of children: Not on file   Years of education: Not on file   Highest education level: Not on file  Occupational History   Not on file  Tobacco Use   Smoking status: Former  Types: Cigarettes   Smokeless tobacco: Not on file   Tobacco comments:    Former smoker 01/11/22  Substance and Sexual Activity   Alcohol use: Not Currently   Drug use: Not Currently   Sexual activity: Not on file  Other Topics Concern   Not on file  Social History Narrative   Not on file   Social Determinants of Health   Financial Resource Strain: Not on file  Food Insecurity: Not on file  Transportation Needs: Not on file  Physical Activity: Not on file  Stress: Not on file  Social Connections: Not on file     Family History: The patient's family history  includes COPD in his brother.  ROS:   Please see the history of present illness.     All other systems reviewed and are negative.  EKGs/Labs/Other Studies Reviewed:    The following studies were reviewed today:  Echo 12/07/2021:  1. Left ventricular ejection fraction, by estimation, is 35 to 40%. The left ventricle has moderately decreased function. The left ventricle demonstrates global hypokinesis. Left ventricular diastolic parameters are indeterminate.   2. Right ventricular systolic function is normal. The right ventricular  size is normal.   3. Left atrial size was mildly dilated.   4. The mitral valve is normal in structure. Trivial mitral valve  regurgitation. No evidence of mitral stenosis.   5. The aortic valve is normal in structure. Aortic valve regurgitation is not visualized. No aortic stenosis is present.   6. The inferior vena cava is normal in size with greater than 50%  respiratory variability, suggesting right atrial pressure of 3 mmHg.   EKG:   EKG is personally reviewed.  02/22/2022: Atrial fibrillation  Recent Labs: 10/28/2021: ALT 23; TSH 0.566 12/23/2021: BUN 24; Creatinine, Ser 1.06; Hemoglobin 13.2; Platelets 103; Potassium 4.6; Sodium 143   Recent Lipid Panel No results found for: CHOL, TRIG, HDL, CHOLHDL, VLDL, LDLCALC, LDLDIRECT  Physical Exam:    VS:  BP 118/74    Pulse 98    Ht '5\' 6"'$  (1.676 m)    Wt 149 lb 9.6 oz (67.9 kg)    SpO2 97%    BMI 24.15 kg/m     Wt Readings from Last 3 Encounters:  02/22/22 149 lb 9.6 oz (67.9 kg)  01/11/22 151 lb (68.5 kg)  12/28/21 150 lb 2.1 oz (68.1 kg)     GEN: Well nourished, well developed in no acute distress HEENT: Normal NECK: No JVD; No carotid bruits LYMPHATICS: No lymphadenopathy CARDIAC: Irregularly irregular, no murmurs, rubs, gallops RESPIRATORY:  Clear to auscultation without rales, wheezing or rhonchi  ABDOMEN: Soft, non-tender, non-distended MUSCULOSKELETAL:  No edema; No deformity  SKIN: Warm  and dry NEUROLOGIC:  Alert and oriented x 3 PSYCHIATRIC:  Normal affect       ASSESSMENT:    1. Pre-op evaluation   2. Atrial fibrillation, unspecified type ([place])   3. Persistent atrial fibrillation ([place])   4. Typical atrial flutter ([place])   5. Chronic systolic heart failure ([facility])    PLAN:    In order of problems listed above:  #Persistent atrial fibrillation and flutter Symptomatic.  Also with chronic systolic heart failure.  Rhythm control is indicated.  On Eliquis for stroke prophylaxis.  I discussed rhythm control strategies with the patient including antiarrhythmic drugs and catheter ablation.  He would like to avoid antiarrhythmic drugs if possible which I think is reasonable.  I discussed the ablation procedure in detail with the patient including the  risks, recovery and likelihood of success.  I discussed that after an initial ablation procedure, repeat ablations or antiarrhythmic drugs may be necessary to achieve rhythm control.  He is interested in proceeding.   Risk, benefits, and alternatives to EP study and radiofrequency ablation for afib were also discussed in detail today. These risks include but are not limited to stroke, bleeding, vascular damage, tamponade, perforation, damage to the esophagus, lungs, and other structures, pulmonary vein stenosis, worsening renal function, and death. The patient understands these risk and wishes to proceed.  We will therefore proceed with catheter ablation at the next available time.  Carto, ICE, anesthesia are requested for the procedure.  Will also obtain CT PV protocol prior to the procedure to exclude LAA thrombus and further evaluate atrial anatomy.   #Chronic systolic heart failure NYHA class II today.  Warm and dry on exam.  Continue Toprol.  Rhythm control is indicated.  Continue losartan.  Will need repeat echocardiogram 3 months after ablation procedure if rhythm control is achieved.  Total time spent with patient today 60  minutes. This includes reviewing records, evaluating the patient and coordinating care.  Medication Adjustments/Labs and Tests Ordered: Current medicines are reviewed at length with the patient today.  Concerns regarding medicines are outlined above.  Orders Placed This Encounter  Procedures   CT CARDIAC MORPH/PULM VEIN W/CM&W/O CA SCORE   CBC w/Diff   Basic Metabolic Panel (BMET)   EKG 12-Lead   Meds ordered this encounter  Medications   metoprolol tartrate (LOPRESSOR) 25 MG tablet    Sig: Take 1 tablet (25 mg total) by mouth Once PRN for up to 1 dose (If your heart rate is over 80 two hour prior to CT scan take.).    Dispense:  1 tablet    Refill:  0   metoprolol succinate (TOPROL-XL) 50 MG 24 hr tablet    Sig: Take 1 tablet (50 mg total) by mouth 2 (two) times daily. Take with or immediately following a meal.    Dispense:  180 tablet    Refill:  3    Dose increase    I,Mathew Stumpf,acting as a scribe for [name], MD.,have documented all relevant documentation on the behalf of [name], MD,as directed by  [name], MD while in the presence of [name], MD.   I, [name], MD, have reviewed all documentation for this visit. The documentation on 02/22/22 for the exam, diagnosis, procedures, and orders are all accurate and complete.  Signed, Hilton Cork. Quentin Ore, MD, [facility], [facility] 02/22/2022 9:32 PM    Electrophysiology [facility]

## 2022-02-22 NOTE — Patient Instructions (Addendum)
Medication Instructions:  ?Your physician recommends that you continue on your current medications as directed. Please refer to the Current Medication list given to you today. ?*If you need a refill on your cardiac medications before your next appointment, please call your pharmacy* ? ?Lab Work: ?None. ?If you have labs (blood work) drawn today and your tests are completely normal, you will receive your results only by: ?MyChart Message (if you have MyChart) OR ?A paper copy in the mail ?If you have any lab test that is abnormal or we need to change your treatment, we will call you to review the results. ? ?Testing/Procedures: ?Your physician has requested that you have cardiac CT. Cardiac computed tomography (CT) is a painless test that uses an x-ray machine to take clear, detailed pictures of your heart. For further information please visit [email or web address]. Please follow instruction sheet as given. ?  ?Your physician has recommended that you have an ablation. Catheter ablation is a medical procedure used to treat some cardiac arrhythmias (irregular heartbeats). During catheter ablation, a long, thin, flexible tube is put into a blood vessel in your groin (upper thigh), or neck. This tube is called an ablation catheter. It is then guided to your heart through the blood vessel. Radio frequency waves destroy small areas of heart tissue where abnormal heartbeats may cause an arrhythmia to start. Please see the instruction sheet given to you today.  ? ?Follow-Up: ?At [facility], you and your health needs are our priority.  As part of our continuing mission to provide you with exceptional heart care, we have created designated Provider Care Teams.  These Care Teams include your primary Cardiologist (physician) and Advanced Practice Providers (APPs -  Physician Assistants and Nurse Practitioners) who all work together to provide you with the care you need, when you need it. ? ?Your physician wants you to  follow-up in: see instruction letter. ? ?We recommend signing up for the patient portal called "MyChart".  Sign up information is provided on this After Visit Summary.  MyChart is used to connect with patients for Virtual Visits (Telemedicine).  Patients are able to view lab/test results, encounter notes, upcoming appointments, etc.  Non-urgent messages can be sent to your provider as well.   ?To learn more about what you can do with MyChart, go to [email or web address].   ? ?Any Other Special Instructions Will Be Listed Below (If Applicable). ? ?Cardiac Ablation ?Cardiac ablation is a procedure to destroy (ablate) some heart tissue that is sending bad signals. These bad signals cause problems in heart rhythm. ?The heart has many areas that make these signals. If there are problems in these areas, they can make the heart beat in a way that is not normal. Destroying some tissues can help make the heart rhythm normal. ?Tell your doctor about: ?Any allergies you have. ?All medicines you are taking. These include vitamins, herbs, eye drops, creams, and over-the-counter medicines. ?Any problems you or family members have had with medicines that make you fall asleep (anesthetics). ?Any blood disorders you have. ?Any surgeries you have had. ?Any medical conditions you have, such as kidney failure. ?Whether you are pregnant or may be pregnant. ?What are the risks? ?This is a safe procedure. But problems may occur, including: ?Infection. ?Bruising and bleeding. ?Bleeding into the chest. ?Stroke or blood clots. ?Damage to nearby areas of your body. ?Allergies to medicines or dyes. ?The need for a pacemaker if the normal system is damaged. ?Failure of the procedure to treat the problem. ?What  happens before the procedure? ?Medicines ?Ask your doctor about: ?Changing or stopping your normal medicines. This is important. ?Taking aspirin and ibuprofen. Do not take these medicines unless your doctor tells you to take  them. ?Taking other medicines, vitamins, herbs, and supplements. ?General instructions ?Follow instructions from your doctor about what you cannot eat or drink. ?Plan to have someone take you home from the hospital or clinic. ?If you will be going home right after the procedure, plan to have someone with you for 24 hours. ?Ask your doctor what steps will be taken to prevent infection. ?What happens during the procedure? ? ?An IV tube will be put into one of your veins. ?You will be given a medicine to help you relax. ?The skin on your neck or groin will be numbed. ?A cut (incision) will be made in your neck or groin. A needle will be put through your cut and into a large vein. ?A tube (catheter) will be put into the needle. The tube will be moved to your heart. ?Dye may be put through the tube. This helps your doctor see your heart. ?Small devices (electrodes) on the tube will send out signals. ?A type of energy will be used to destroy some heart tissue. ?The tube will be taken out. ?Pressure will be held on your cut. This helps stop bleeding. ?A bandage will be put over your cut. ?The exact procedure may vary among doctors and hospitals. ?What happens after the procedure? ?You will be watched until you leave the hospital or clinic. This includes checking your heart rate, breathing rate, oxygen, and blood pressure. ?Your cut will be watched for bleeding. You will need to lie still for a few hours. ?Do not drive for 24 hours or as long as your doctor tells you. ?Summary ?Cardiac ablation is a procedure to destroy some heart tissue. This is done to treat heart rhythm problems. ?Tell your doctor about any medical conditions you may have. Tell him or her about all medicines you are taking to treat them. ?This is a safe procedure. But problems may occur. These include infection, bruising, bleeding, and damage to nearby areas of your body. ?Follow what your doctor tells you about food and drink. You may also be told to  change or stop some of your medicines. ?After the procedure, do not drive for 24 hours or as long as your doctor tells you. ?This information is not intended to replace advice given to you by your health care provider. Make sure you discuss any questions you have with your health care provider. ?Document Revised: 11/07/2019 Document Reviewed: 11/07/2019 ?Elsevier Patient Education ? 2022 [place]. ? ? ? ?  ? ? ?

## 2022-05-05 ENCOUNTER — Other Ambulatory Visit (HOSPITAL_COMMUNITY): Payer: Self-pay

## 2022-05-10 ENCOUNTER — Other Ambulatory Visit: Payer: PPO

## 2022-05-19 ENCOUNTER — Telehealth: Payer: Self-pay | Admitting: *Deleted

## 2022-05-19 NOTE — Telephone Encounter (Signed)
Left a message to call back.  The patient missed his preop lab appt on 5/23.

## 2022-05-20 ENCOUNTER — Telehealth (HOSPITAL_COMMUNITY): Payer: Self-pay | Admitting: *Deleted

## 2022-05-20 NOTE — Telephone Encounter (Signed)
Reaching out to patient to offer assistance regarding upcoming cardiac imaging study; pt verbalizes understanding of appt date/time, parking situation and where to check in, pre-test NPO status and verified current allergies; name and call back number provided for further questions should they arise  Gordy Clement RN Navigator Cardiac [place] [facility] [phone] office [phone] cell  Patient to take morning metoprolol succinate two hours prior to scan. He is aware to arrive at 7:30am. He is aware to obtain blood work.

## 2022-05-24 ENCOUNTER — Telehealth: Payer: Self-pay | Admitting: *Deleted

## 2022-05-24 ENCOUNTER — Encounter (HOSPITAL_COMMUNITY): Payer: Self-pay

## 2022-05-24 ENCOUNTER — Ambulatory Visit (HOSPITAL_COMMUNITY)
Admission: RE | Admit: 2022-05-24 | Discharge: 2022-05-24 | Disposition: A | Discharge status: Home / Self Care | Payer: PPO | Source: Ambulatory Visit | Attending: Cardiology | Admitting: Cardiology

## 2022-05-24 DIAGNOSIS — I4891 Unspecified atrial fibrillation: Secondary | ICD-10-CM | POA: Insufficient documentation

## 2022-05-24 LAB — POCT I-STAT CREATININE: Creatinine, Ser: 1.3 mg/dL — ABNORMAL HIGH (ref 0.61–1.24)

## 2022-05-24 MED ORDER — METOPROLOL TARTRATE 5 MG/5ML IV SOLN
INTRAVENOUS | Status: AC
  Administered 2022-05-24: 10 mg via INTRAVENOUS
  Filled 2022-05-24: qty 10

## 2022-05-24 MED ORDER — METOPROLOL TARTRATE 5 MG/5ML IV SOLN: 10.0000 mg | Freq: Once | INTRAVENOUS | Status: AC

## 2022-05-24 MED ORDER — IOHEXOL 350 MG/ML SOLN
100.0000 mL | Freq: Once | INTRAVENOUS | Status: AC | PRN
  Administered 2022-05-24: 100 mL via INTRAVENOUS

## 2022-05-24 NOTE — Telephone Encounter (Signed)
CT scan today. Looks like patient spoke with CT department staff and made aware of missed lab and need to have them done prior to CT.

## 2022-05-24 NOTE — Telephone Encounter (Addendum)
The patient has been notified of the Cardiac CT result and need for follow up at the [facility]. Also advised because of this result we need to cancel his ablation for now. Patient verbalized understanding.  All questions (if any) were answered. Darrell Jewel, RN 05/24/2022 1:37 PM      AFIB CLINIC INFORMATION: Your appointment is scheduled on: 05/31/21 at 9:45 am. Please arrive 15 minutes early for check-in. The AFib Clinic is located in the Heart and Vascular Specialty Clinics at [facility]. Parking instructions/directions: [address] (off [address]). When you pull in to Entrance C, there is an underground parking garage to your right. The code to enter the garage is 1004. Take the elevators to the first floor. Follow the signs to the Heart and Vascular Specialty Clinics. You will see registration at the end of the hallway.  Phone number: [phone]

## 2022-05-30 ENCOUNTER — Encounter (HOSPITAL_COMMUNITY): Payer: Self-pay

## 2022-05-30 ENCOUNTER — Ambulatory Visit (HOSPITAL_COMMUNITY): Admit: 2022-05-30 | Payer: PPO | Admitting: Cardiology

## 2022-05-30 SURGERY — ATRIAL FIBRILLATION ABLATION
Anesthesia: General

## 2022-05-31 ENCOUNTER — Ambulatory Visit (HOSPITAL_COMMUNITY): Payer: PPO | Admitting: Physician Assistant

## 2022-05-31 ENCOUNTER — Ambulatory Visit (HOSPITAL_COMMUNITY)
Admission: RE | Admit: 2022-05-31 | Discharge: 2022-05-31 | Disposition: A | Discharge status: Home / Self Care | Payer: PPO | Source: Ambulatory Visit | Attending: Physician Assistant | Admitting: Physician Assistant

## 2022-05-31 ENCOUNTER — Encounter (HOSPITAL_COMMUNITY): Payer: Self-pay | Admitting: Physician Assistant

## 2022-05-31 VITALS — BP 112/88 | HR 83 | Ht 66.0 in | Wt 144.4 lb

## 2022-05-31 DIAGNOSIS — Z7902 Long term (current) use of antithrombotics/antiplatelets: Secondary | ICD-10-CM | POA: Insufficient documentation

## 2022-05-31 DIAGNOSIS — I5020 Unspecified systolic (congestive) heart failure: Secondary | ICD-10-CM | POA: Diagnosis not present

## 2022-05-31 DIAGNOSIS — D6869 Other thrombophilia: Secondary | ICD-10-CM | POA: Insufficient documentation

## 2022-05-31 DIAGNOSIS — I110 Hypertensive heart disease with heart failure: Secondary | ICD-10-CM | POA: Diagnosis not present

## 2022-05-31 DIAGNOSIS — Z79899 Other long term (current) drug therapy: Secondary | ICD-10-CM | POA: Insufficient documentation

## 2022-05-31 DIAGNOSIS — Z7901 Long term (current) use of anticoagulants: Secondary | ICD-10-CM | POA: Diagnosis not present

## 2022-05-31 DIAGNOSIS — I4819 Other persistent atrial fibrillation: Secondary | ICD-10-CM | POA: Diagnosis not present

## 2022-05-31 MED ORDER — RIVAROXABAN 20 MG PO TABS: 20.0000 mg | ORAL_TABLET | Freq: Every day | ORAL | 3 refills | Status: DC

## 2022-05-31 NOTE — H&P (View-Only) (Signed)
Primary Care Physician: [name], DO Primary Cardiologist: Dr Sallyanne Kuster  Primary Electrophysiologist: Dr Quentin Ore Referring Physician: Dr Ulice Dash is a 76 y.o. male with a history of HTN, chronic systolic CHF, and atrial fibrillation who presents for follow up in the [facility]. The patient was initially diagnosed with atrial fibrillation 10/28/21 after presenting to the ED with symptoms of SOB, ECG showed afib with RVR. His heart rate was slowed with IV diltiazem and he was started on Xarelto for a CHADS2VASC score of 3. He did not tolerate Xarelto due to "his whole body being on fire and itching." He was changed to Eliquis. Echo showed EF 35-40% and diltiazem was changed to metoprolol. Patient is s/p DCCV on 12/28/21. Unfortunately, he was back in afib with rapid rates on follow up. Of note, since DCCV he had only been taking Eliquis once daily.   On follow up today, patient was seen by Dr Quentin Ore 02/22/22 and afib ablation was planned. He had a cardiac CT 05/24/22 which showed LAA thrombus and ablation was cancelled. Patient admits that he frequently misses his AM dose of Eliquis. He is good about taking his PM dose. He has checked his heart rates at home which have been well controlled 70s-80s bpm.   Today, he denies symptoms of palpitations, chest pain, orthopnea, PND, lower extremity edema, dizziness, presyncope, syncope, snoring, daytime somnolence, bleeding, or neurologic sequela. The patient is tolerating medications without difficulties and is otherwise without complaint today.    Atrial Fibrillation Risk Factors:  he does not have symptoms or diagnosis of sleep apnea. he does not have a history of rheumatic fever.   he has a BMI of Body mass index is 23.31 kg/m.Marland Kitchen Filed Weights   05/31/22 0910  Weight: 65.5 kg    Family History  Problem Relation Age of Onset   COPD Brother      Atrial Fibrillation Management  history:  Previous antiarrhythmic drugs: none Previous cardioversions: 12/28/21 Previous ablations: none CHADS2VASC score: 3 Anticoagulation history: Eliquis   Past Medical History:  Diagnosis Date   Hypertension    Past Surgical History:  Procedure Laterality Date   CARDIOVERSION N/A 12/28/2021   Procedure: CARDIOVERSION;  Surgeon: [name], MD;  Location: [facility] ENDOSCOPY;  Service: Cardiovascular;  Laterality: N/A;   CIRCUMCISION     TONSILLECTOMY AND ADENOIDECTOMY      Current Outpatient Medications  Medication Sig Dispense Refill   atorvastatin (LIPITOR) 40 MG tablet Take 40 mg by mouth daily.     losartan-hydrochlorothiazide (HYZAAR) 100-12.5 MG tablet TAKE 1 TABLET BY MOUTH EVERY DAY FOR BLOOD PRESSURE     metoprolol succinate (TOPROL-XL) 50 MG 24 hr tablet Take 1 tablet (50 mg total) by mouth 2 (two) times daily. Take with or immediately following a meal. 180 tablet 3   metoprolol tartrate (LOPRESSOR) 25 MG tablet Take 1 tablet (25 mg total) by mouth Once PRN for up to 1 dose (If your heart rate is over 80 two hour prior to CT scan take.). 1 tablet 0   rivaroxaban (XARELTO) 20 MG TABS tablet Take 1 tablet (20 mg total) by mouth daily with supper. STOP ELIQUIS 30 tablet 3   sildenafil (VIAGRA) 25 MG tablet Take 12.5 mg by mouth as needed for erectile dysfunction.     tamsulosin (FLOMAX) 0.4 MG CAPS capsule TAKE 1 CAPSULE BY MOUTH DAILY FOR PROSTATE     No current facility-administered medications for this encounter.  Allergies  Allergen Reactions   Cheese Hives   Bee Venom Swelling   Chocolate Hives    In large quantities    Other Itching    Buttermilk    Wasp Venom Itching    Social History   Socioeconomic History   Marital status: Widowed    Spouse name: Not on file   Number of children: Not on file   Years of education: Not on file   Highest education level: Not on file  Occupational History   Not on file  Tobacco Use   Smoking status: Former     Types: Cigarettes   Smokeless tobacco: Not on file   Tobacco comments:    Former smoker 01/11/22  Substance and Sexual Activity   Alcohol use: Not Currently   Drug use: Not Currently   Sexual activity: Not on file  Other Topics Concern   Not on file  Social History Narrative   Not on file   Social Determinants of Health   Financial Resource Strain: Not on file  Food Insecurity: Not on file  Transportation Needs: Not on file  Physical Activity: Not on file  Stress: Not on file  Social Connections: Not on file  Intimate Partner Violence: Not on file     ROS- All systems are reviewed and negative except as per the HPI above.  Physical Exam: Vitals:   05/31/22 0910  BP: 112/88  Pulse: 83  Weight: 65.5 kg  Height: '5\' 6"'$  (1.676 m)     GEN- The patient is a well appearing elderly male, alert and oriented x 3 today.   HEENT-head normocephalic, atraumatic, sclera clear, conjunctiva pink, hearing intact, trachea midline. Lungs- Clear to ausculation bilaterally, normal work of breathing Heart- irregular rate and rhythm, no murmurs, rubs or gallops  GI- soft, NT, ND, + BS Extremities- no clubbing, cyanosis, or edema MS- no significant deformity or atrophy Skin- no rash or lesion Psych- euthymic mood, full affect Neuro- strength and sensation are intact   Wt Readings from Last 3 Encounters:  05/31/22 65.5 kg  02/22/22 67.9 kg  01/11/22 68.5 kg    EKG today demonstrates  Afib Vent. rate 83 BPM PR interval * ms QRS duration 96 ms QT/QTcB 380/446 ms  Echo 12/07/21 demonstrated  1. Left ventricular ejection fraction, by estimation, is 35 to 40%. The  left ventricle has moderately decreased function. The left ventricle  demonstrates global hypokinesis. Left ventricular diastolic parameters are indeterminate.   2. Right ventricular systolic function is normal. The right ventricular  size is normal.   3. Left atrial size was mildly dilated.   4. The mitral valve is  normal in structure. Trivial mitral valve  regurgitation. No evidence of mitral stenosis.   5. The aortic valve is normal in structure. Aortic valve regurgitation is not visualized. No aortic stenosis is present.   6. The inferior vena cava is normal in size with greater than 50%  respiratory variability, suggesting right atrial pressure of 3 mmHg.   Epic records are reviewed at length today  CHA2DS2-VASc Score = 3  The patient's score is based upon: CHF History: 1 HTN History: 1 Diabetes History: 0 Stroke History: 0 Vascular Disease History: 0 Age Score: 1 Gender Score: 0        ASSESSMENT AND PLAN: 1. Persistent Atrial Fibrillation (ICD10:  I48.19) The patient's CHA2DS2-VASc score is 3, indicating a 3.2% annual risk of stroke.   We discussed options for stroke prevention today. He has difficulty taking  his Eliquis BID. He may have difficulty with warfarin and Pradaxa as well given the monitoring parameters and BID dosing respectively. We discussed Xarelto, he is willing to try again.  Will start Xarelto 20 mg daily and check TEE in 4 weeks to be sure thrombus has resolved. Instructed patient to take Xarelto with food. Continue Toprol 50 mg BID Plan for eventual ablation once thrombus resolves.   2. Secondary Hypercoagulable State (ICD10:  D68.69) The patient is at significant risk for stroke/thromboembolism based upon his CHA2DS2-VASc Score of 3.  Continue Apixaban (Eliquis).   3. 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 Will plan to recheck post ablation. Appears euvolemic today.  4. HTN Stable, no changes today.   Follow up timing pending TEE results.    [facility] [address] [place], [place] [address] [phone] 05/31/2022 9:42 AM

## 2022-05-31 NOTE — Patient Instructions (Signed)
STOP ELIQUIS  START XARELTO '20mg'$  ONCE A DAY WITH SUPPER  TEE scheduled for Wednesday, July 12th  -come to clinic for labs at 1230pm  - Arrive at the [facility] and go to admitting at 1pm  - Do not eat or drink anything after midnight the night prior to your procedure.  - Take all your morning medication (except diabetic medications) with a sip of water prior to arrival.  - You will not be able to drive home after your procedure.  - Do NOT miss any doses of your blood thinner - if you should miss a dose please notify our office immediately.  - If you feel as if you go back into normal rhythm prior to scheduled cardioversion, please notify our office immediately. If your procedure is canceled in the cardioversion suite you will be charged a cancellation fee.

## 2022-05-31 NOTE — Progress Notes (Signed)
Primary Care Physician: [name], DO Primary Cardiologist: Dr Sallyanne Kuster  Primary Electrophysiologist: Dr Quentin Ore Referring Physician: Dr Ulice Dash is a 76 y.o. male with a history of HTN, chronic systolic CHF, and atrial fibrillation who presents for follow up in the [facility]. The patient was initially diagnosed with atrial fibrillation 10/28/21 after presenting to the ED with symptoms of SOB, ECG showed afib with RVR. His heart rate was slowed with IV diltiazem and he was started on Xarelto for a CHADS2VASC score of 3. He did not tolerate Xarelto due to "his whole body being on fire and itching." He was changed to Eliquis. Echo showed EF 35-40% and diltiazem was changed to metoprolol. Patient is s/p DCCV on 12/28/21. Unfortunately, he was back in afib with rapid rates on follow up. Of note, since DCCV he had only been taking Eliquis once daily.   On follow up today, patient was seen by Dr Quentin Ore 02/22/22 and afib ablation was planned. He had a cardiac CT 05/24/22 which showed LAA thrombus and ablation was cancelled. Patient admits that he frequently misses his AM dose of Eliquis. He is good about taking his PM dose. He has checked his heart rates at home which have been well controlled 70s-80s bpm.   Today, he denies symptoms of palpitations, chest pain, orthopnea, PND, lower extremity edema, dizziness, presyncope, syncope, snoring, daytime somnolence, bleeding, or neurologic sequela. The patient is tolerating medications without difficulties and is otherwise without complaint today.    Atrial Fibrillation Risk Factors:  he does not have symptoms or diagnosis of sleep apnea. he does not have a history of rheumatic fever.   he has a BMI of Body mass index is 23.31 kg/m.Marland Kitchen Filed Weights   05/31/22 0910  Weight: 65.5 kg    Family History  Problem Relation Age of Onset   COPD Brother      Atrial Fibrillation Management  history:  Previous antiarrhythmic drugs: none Previous cardioversions: 12/28/21 Previous ablations: none CHADS2VASC score: 3 Anticoagulation history: Eliquis   Past Medical History:  Diagnosis Date   Hypertension    Past Surgical History:  Procedure Laterality Date   CARDIOVERSION N/A 12/28/2021   Procedure: CARDIOVERSION;  Surgeon: [name], MD;  Location: [facility] ENDOSCOPY;  Service: Cardiovascular;  Laterality: N/A;   CIRCUMCISION     TONSILLECTOMY AND ADENOIDECTOMY      Current Outpatient Medications  Medication Sig Dispense Refill   atorvastatin (LIPITOR) 40 MG tablet Take 40 mg by mouth daily.     losartan-hydrochlorothiazide (HYZAAR) 100-12.5 MG tablet TAKE 1 TABLET BY MOUTH EVERY DAY FOR BLOOD PRESSURE     metoprolol succinate (TOPROL-XL) 50 MG 24 hr tablet Take 1 tablet (50 mg total) by mouth 2 (two) times daily. Take with or immediately following a meal. 180 tablet 3   metoprolol tartrate (LOPRESSOR) 25 MG tablet Take 1 tablet (25 mg total) by mouth Once PRN for up to 1 dose (If your heart rate is over 80 two hour prior to CT scan take.). 1 tablet 0   rivaroxaban (XARELTO) 20 MG TABS tablet Take 1 tablet (20 mg total) by mouth daily with supper. STOP ELIQUIS 30 tablet 3   sildenafil (VIAGRA) 25 MG tablet Take 12.5 mg by mouth as needed for erectile dysfunction.     tamsulosin (FLOMAX) 0.4 MG CAPS capsule TAKE 1 CAPSULE BY MOUTH DAILY FOR PROSTATE     No current facility-administered medications for this encounter.  Allergies  Allergen Reactions   Cheese Hives   Bee Venom Swelling   Chocolate Hives    In large quantities    Other Itching    Buttermilk    Wasp Venom Itching    Social History   Socioeconomic History   Marital status: Widowed    Spouse name: Not on file   Number of children: Not on file   Years of education: Not on file   Highest education level: Not on file  Occupational History   Not on file  Tobacco Use   Smoking status: Former     Types: Cigarettes   Smokeless tobacco: Not on file   Tobacco comments:    Former smoker 01/11/22  Substance and Sexual Activity   Alcohol use: Not Currently   Drug use: Not Currently   Sexual activity: Not on file  Other Topics Concern   Not on file  Social History Narrative   Not on file   Social Determinants of Health   Financial Resource Strain: Not on file  Food Insecurity: Not on file  Transportation Needs: Not on file  Physical Activity: Not on file  Stress: Not on file  Social Connections: Not on file  Intimate Partner Violence: Not on file     ROS- All systems are reviewed and negative except as per the HPI above.  Physical Exam: Vitals:   05/31/22 0910  BP: 112/88  Pulse: 83  Weight: 65.5 kg  Height: '5\' 6"'$  (1.676 m)     GEN- The patient is a well appearing elderly male, alert and oriented x 3 today.   HEENT-head normocephalic, atraumatic, sclera clear, conjunctiva pink, hearing intact, trachea midline. Lungs- Clear to ausculation bilaterally, normal work of breathing Heart- irregular rate and rhythm, no murmurs, rubs or gallops  GI- soft, NT, ND, + BS Extremities- no clubbing, cyanosis, or edema MS- no significant deformity or atrophy Skin- no rash or lesion Psych- euthymic mood, full affect Neuro- strength and sensation are intact   Wt Readings from Last 3 Encounters:  05/31/22 65.5 kg  02/22/22 67.9 kg  01/11/22 68.5 kg    EKG today demonstrates  Afib Vent. rate 83 BPM PR interval * ms QRS duration 96 ms QT/QTcB 380/446 ms  Echo 12/07/21 demonstrated  1. Left ventricular ejection fraction, by estimation, is 35 to 40%. The  left ventricle has moderately decreased function. The left ventricle  demonstrates global hypokinesis. Left ventricular diastolic parameters are indeterminate.   2. Right ventricular systolic function is normal. The right ventricular  size is normal.   3. Left atrial size was mildly dilated.   4. The mitral valve is  normal in structure. Trivial mitral valve  regurgitation. No evidence of mitral stenosis.   5. The aortic valve is normal in structure. Aortic valve regurgitation is not visualized. No aortic stenosis is present.   6. The inferior vena cava is normal in size with greater than 50%  respiratory variability, suggesting right atrial pressure of 3 mmHg.   Epic records are reviewed at length today  CHA2DS2-VASc Score = 3  The patient's score is based upon: CHF History: 1 HTN History: 1 Diabetes History: 0 Stroke History: 0 Vascular Disease History: 0 Age Score: 1 Gender Score: 0        ASSESSMENT AND PLAN: 1. Persistent Atrial Fibrillation (ICD10:  I48.19) The patient's CHA2DS2-VASc score is 3, indicating a 3.2% annual risk of stroke.   We discussed options for stroke prevention today. He has difficulty taking  his Eliquis BID. He may have difficulty with warfarin and Pradaxa as well given the monitoring parameters and BID dosing respectively. We discussed Xarelto, he is willing to try again.  Will start Xarelto 20 mg daily and check TEE in 4 weeks to be sure thrombus has resolved. Instructed patient to take Xarelto with food. Continue Toprol 50 mg BID Plan for eventual ablation once thrombus resolves.   2. Secondary Hypercoagulable State (ICD10:  D68.69) The patient is at significant risk for stroke/thromboembolism based upon his CHA2DS2-VASc Score of 3.  Continue Apixaban (Eliquis).   3. 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 Will plan to recheck post ablation. Appears euvolemic today.  4. HTN Stable, no changes today.   Follow up timing pending TEE results.    [facility] [address] [place], [place] [address] [phone] 05/31/2022 9:42 AM

## 2022-06-23 ENCOUNTER — Encounter (HOSPITAL_COMMUNITY): Payer: Self-pay | Admitting: Cardiology

## 2022-06-27 ENCOUNTER — Ambulatory Visit (HOSPITAL_COMMUNITY): Payer: PPO | Admitting: Physician Assistant

## 2022-06-29 ENCOUNTER — Encounter (HOSPITAL_COMMUNITY): Payer: Self-pay | Admitting: Cardiology

## 2022-06-29 ENCOUNTER — Ambulatory Visit (HOSPITAL_COMMUNITY)
Admission: RE | Admit: 2022-06-29 | Discharge: 2022-06-29 | Disposition: A | Discharge status: Home / Self Care | Payer: PPO | Source: Ambulatory Visit | Attending: Physician Assistant | Admitting: Physician Assistant

## 2022-06-29 ENCOUNTER — Encounter (HOSPITAL_COMMUNITY)
Admission: RE | Disposition: A | Discharge status: Home / Self Care | Payer: Self-pay | Source: Home / Self Care | Attending: Cardiology

## 2022-06-29 ENCOUNTER — Ambulatory Visit (HOSPITAL_BASED_OUTPATIENT_CLINIC_OR_DEPARTMENT_OTHER): Payer: PPO

## 2022-06-29 ENCOUNTER — Other Ambulatory Visit: Payer: Self-pay

## 2022-06-29 ENCOUNTER — Ambulatory Visit (HOSPITAL_COMMUNITY): Payer: PPO | Admitting: Certified Registered"

## 2022-06-29 ENCOUNTER — Ambulatory Visit (HOSPITAL_COMMUNITY)
Admission: RE | Admit: 2022-06-29 | Discharge: 2022-06-29 | Disposition: A | Discharge status: Home / Self Care | Payer: PPO | Attending: Cardiology | Admitting: Cardiology

## 2022-06-29 ENCOUNTER — Ambulatory Visit (HOSPITAL_BASED_OUTPATIENT_CLINIC_OR_DEPARTMENT_OTHER): Payer: PPO | Admitting: Certified Registered"

## 2022-06-29 DIAGNOSIS — E785 Hyperlipidemia, unspecified: Secondary | ICD-10-CM | POA: Diagnosis not present

## 2022-06-29 DIAGNOSIS — I4891 Unspecified atrial fibrillation: Secondary | ICD-10-CM

## 2022-06-29 DIAGNOSIS — I4819 Other persistent atrial fibrillation: Secondary | ICD-10-CM

## 2022-06-29 DIAGNOSIS — I5022 Chronic systolic (congestive) heart failure: Secondary | ICD-10-CM | POA: Insufficient documentation

## 2022-06-29 DIAGNOSIS — I10 Essential (primary) hypertension: Secondary | ICD-10-CM | POA: Diagnosis not present

## 2022-06-29 DIAGNOSIS — D6869 Other thrombophilia: Secondary | ICD-10-CM | POA: Diagnosis not present

## 2022-06-29 DIAGNOSIS — I088 Other rheumatic multiple valve diseases: Secondary | ICD-10-CM | POA: Diagnosis not present

## 2022-06-29 DIAGNOSIS — Z79899 Other long term (current) drug therapy: Secondary | ICD-10-CM | POA: Insufficient documentation

## 2022-06-29 DIAGNOSIS — Z7901 Long term (current) use of anticoagulants: Secondary | ICD-10-CM | POA: Insufficient documentation

## 2022-06-29 DIAGNOSIS — I110 Hypertensive heart disease with heart failure: Secondary | ICD-10-CM | POA: Insufficient documentation

## 2022-06-29 DIAGNOSIS — Z87891 Personal history of nicotine dependence: Secondary | ICD-10-CM | POA: Insufficient documentation

## 2022-06-29 HISTORY — PX: TEE WITHOUT CARDIOVERSION: SHX5443

## 2022-06-29 HISTORY — PX: BUBBLE STUDY: SHX6837

## 2022-06-29 LAB — CBC
HCT: 40.6 % (ref 39.0–52.0)
Hemoglobin: 13.8 g/dL (ref 13.0–17.0)
MCH: 29.8 pg (ref 26.0–34.0)
MCHC: 34 g/dL (ref 30.0–36.0)
MCV: 87.7 fL (ref 80.0–100.0)
Platelets: 87 10*3/uL — ABNORMAL LOW (ref 150–400)
RBC: 4.63 MIL/uL (ref 4.22–5.81)
RDW: 14.9 % (ref 11.5–15.5)
WBC: 8.3 10*3/uL (ref 4.0–10.5)
nRBC: 0 % (ref 0.0–0.2)

## 2022-06-29 LAB — BASIC METABOLIC PANEL
Anion gap: 10 (ref 5–15)
BUN: 32 mg/dL — ABNORMAL HIGH (ref 8–23)
CO2: 22 mmol/L (ref 22–32)
Calcium: 9.2 mg/dL (ref 8.9–10.3)
Chloride: 109 mmol/L (ref 98–111)
Creatinine, Ser: 1.34 mg/dL — ABNORMAL HIGH (ref 0.61–1.24)
GFR, Estimated: 55 mL/min — ABNORMAL LOW (ref >60)
Glucose, Bld: 118 mg/dL — ABNORMAL HIGH (ref 70–99)
Potassium: 4.4 mmol/L (ref 3.5–5.1)
Sodium: 141 mmol/L (ref 135–145)

## 2022-06-29 LAB — ECHO TEE
AV Mean grad: 2 mmHg
AV Peak grad: 2.7 mmHg
Ao pk vel: 0.82 m/s

## 2022-06-29 SURGERY — ECHOCARDIOGRAM, TRANSESOPHAGEAL
Anesthesia: Monitor Anesthesia Care

## 2022-06-29 MED ORDER — SODIUM CHLORIDE 0.9 % IV SOLN: INTRAVENOUS | Status: DC | PRN

## 2022-06-29 MED ORDER — PROPOFOL 10 MG/ML IV BOLUS
INTRAVENOUS | Status: DC | PRN
  Administered 2022-06-29: 125 ug/kg/min via INTRAVENOUS

## 2022-06-29 MED ORDER — LIDOCAINE 2% (20 MG/ML) 5 ML SYRINGE
INTRAMUSCULAR | Status: DC | PRN
  Administered 2022-06-29: 60 mg via INTRAVENOUS

## 2022-06-29 NOTE — Interval H&P Note (Signed)
History and Physical Interval Note:  06/29/2022 11:58 AM  Robert Stark  has presented today for surgery, with the diagnosis of afib.  The various methods of treatment have been discussed with the patient and family. After consideration of risks, benefits and other options for treatment, the patient has consented to  Procedure(s): TRANSESOPHAGEAL ECHOCARDIOGRAM (TEE) (N/A) as a surgical intervention.  The patient's history has been reviewed, patient examined, no change in status, stable for surgery.  I have reviewed the patient's chart and labs.  Questions were answered to the patient's satisfaction.     [name]

## 2022-06-29 NOTE — CV Procedure (Signed)
     Transesophageal Echocardiogram Note  Robert Stark [id number] September 30, 1946  Procedure: Transesophageal Echocardiogram Indications: LAA thrombus  Procedure Details Consent: Obtained Time Out: Verified patient identification, verified procedure, site/side was marked, verified correct patient position, special equipment/implants available, Radiology Safety Procedures followed,  medications/allergies/relevent history reviewed, required imaging and test results available.  Performed  Medications: Propofol: 195.'9mg'$   Left Ventrical:  LVEF 35%  Mitral Valve: Normal structure, mild MR  Aortic Valve: Tricuspid, trivial AI  Tricuspid Valve: Normal structure, trivial TR  Pulmonic Valve: Normal structure, trivial PI  Left Atrium/ Left atrial appendage: No evidence of LAA thrombus   Atrial septum: No PFO visualized by color doppler or agitated saline study  Aorta: Grade III plaque   Complications: No apparent complications Patient did tolerate procedure well.  [name], MD 06/29/2022, 12:54 PM

## 2022-06-29 NOTE — Transfer of Care (Signed)
Immediate Anesthesia Transfer of Care Note  Patient: EDGARDO PETRENKO  Procedure(s) Performed: TRANSESOPHAGEAL ECHOCARDIOGRAM (TEE) BUBBLE STUDY  Patient Location: PACU and Endoscopy Unit  Anesthesia Type:MAC  Level of Consciousness: awake, oriented and drowsy  Airway & Oxygen Therapy: Patient Spontanous Breathing  Post-op Assessment: Report given to RN and Post -op Vital signs reviewed and stable  Post vital signs: Reviewed and stable  Last Vitals:  Vitals Value Taken Time  BP 125/76   Temp    Pulse 77 06/29/22 1300  Resp 21 06/29/22 1300  SpO2 95 % 06/29/22 1300  Vitals shown include unvalidated device data.  Last Pain:  Vitals:   06/29/22 1159  TempSrc: Oral  PainSc: 0-No pain         Complications: No notable events documented.

## 2022-06-29 NOTE — Anesthesia Procedure Notes (Signed)
Procedure Name: MAC Date/Time: 06/29/2022 12:24 PM  Performed by: Anastasio Auerbach, CRNAPre-anesthesia Checklist: Patient identified, Emergency Drugs available, Suction available, Patient being monitored and Timeout performed Patient Re-evaluated:Patient Re-evaluated prior to induction Oxygen Delivery Method: Nasal cannula Induction Type: IV induction Airway Equipment and Method: Bite block

## 2022-06-29 NOTE — Progress Notes (Signed)
  Echocardiogram Echocardiogram Transesophageal has been performed.  Bobbye Charleston 06/29/2022, 1:18 PM

## 2022-06-29 NOTE — Anesthesia Preprocedure Evaluation (Addendum)
Anesthesia Evaluation  Patient identified by MRN, date of birth, ID band Patient awake    Reviewed: Allergy & Precautions, NPO status , Patient's Chart, lab work & pertinent test results, reviewed documented beta blocker date and time   Airway Mallampati: II  TM Distance: >3 FB Neck ROM: Full    Dental no notable dental hx. (+) Teeth Intact, Dental Advisory Given   Pulmonary neg pulmonary ROS, former smoker,    Pulmonary exam normal breath sounds clear to auscultation       Cardiovascular hypertension, Pt. on medications and Pt. on home beta blockers Normal cardiovascular exam+ dysrhythmias (on xarelto) Atrial Fibrillation  Rhythm:Irregular Rate:Normal  TTE 2022 1. Left ventricular ejection fraction, by estimation, is 35 to 40%. The  left ventricle has moderately decreased function. The left ventricle  demonstrates global hypokinesis. Left ventricular diastolic parameters are  indeterminate.  2. Right ventricular systolic function is normal. The right ventricular  size is normal.  3. Left atrial size was mildly dilated.  4. The mitral valve is normal in structure. Trivial mitral valve  regurgitation. No evidence of mitral stenosis.  5. The aortic valve is normal in structure. Aortic valve regurgitation is  not visualized. No aortic stenosis is present.  6. The inferior vena cava is normal in size with greater than 50%  respiratory variability, suggesting right atrial pressure of 3 mmHg.    Neuro/Psych negative neurological ROS  negative psych ROS   GI/Hepatic negative GI ROS, Neg liver ROS,   Endo/Other  negative endocrine ROS  Renal/GU negative Renal ROS  negative genitourinary   Musculoskeletal negative musculoskeletal ROS (+)   Abdominal   Peds  Hematology negative hematology ROS (+)   Anesthesia Other Findings   Reproductive/Obstetrics                           Anesthesia  Physical Anesthesia Plan  ASA: 3  Anesthesia Plan: MAC   Post-op Pain Management:    Induction: Intravenous  PONV Risk Score and Plan: Propofol infusion and Treatment may vary due to age or medical condition  Airway Management Planned: Natural Airway  Additional Equipment:   Intra-op Plan:   Post-operative Plan:   Informed Consent: I have reviewed the patients History and Physical, chart, labs and discussed the procedure including the risks, benefits and alternatives for the proposed anesthesia with the patient or authorized representative who has indicated his/her understanding and acceptance.     Dental advisory given  Plan Discussed with: CRNA  Anesthesia Plan Comments:         Anesthesia Quick Evaluation

## 2022-06-29 NOTE — Discharge Instructions (Signed)

## 2022-06-29 NOTE — Anesthesia Postprocedure Evaluation (Signed)
Anesthesia Post Note  Patient: Robert Stark  Procedure(s) Performed: TRANSESOPHAGEAL ECHOCARDIOGRAM (TEE) BUBBLE STUDY     Patient location during evaluation: Endoscopy Anesthesia Type: MAC Level of consciousness: patient cooperative, oriented and sedated Pain management: pain level controlled Vital Signs Assessment: post-procedure vital signs reviewed and stable Respiratory status: nonlabored ventilation, spontaneous breathing and respiratory function stable Cardiovascular status: blood pressure returned to baseline and stable Postop Assessment: no apparent nausea or vomiting Anesthetic complications: no   No notable events documented.  Last Vitals:  Vitals:   06/29/22 1300 06/29/22 1310  BP: 118/74 128/87  Pulse: 72 75  Resp: (!) 22 18  Temp:    SpO2: 95% 94%    Last Pain:  Vitals:   06/29/22 1310  TempSrc:   PainSc: 0-No pain                 Darius Lundberg,E. Amandeep Nesmith

## 2022-06-30 ENCOUNTER — Encounter (HOSPITAL_COMMUNITY): Payer: Self-pay | Admitting: Cardiology

## 2022-07-06 ENCOUNTER — Telehealth: Payer: Self-pay | Admitting: *Deleted

## 2022-07-06 NOTE — Telephone Encounter (Signed)
Patient picked reschedule ablation date, August 24 and pre op lab date Aug 10. Will meet with RN on lab day for Ablation instructions.  Patient verbalized understanding and agreement.

## 2022-07-07 NOTE — Telephone Encounter (Signed)
Pt scheduled for afib ablation.  Lab work scheduled.  Work up complete.  Instruction letter mailed to Pt.

## 2022-07-17 IMAGING — DX DG CHEST 2V
2 series · 2 of 2 positions shown · non-contrast
Comparison: None.

CLINICAL DATA: sob

EXAM:
CHEST - 2 VIEW

[chest pa]
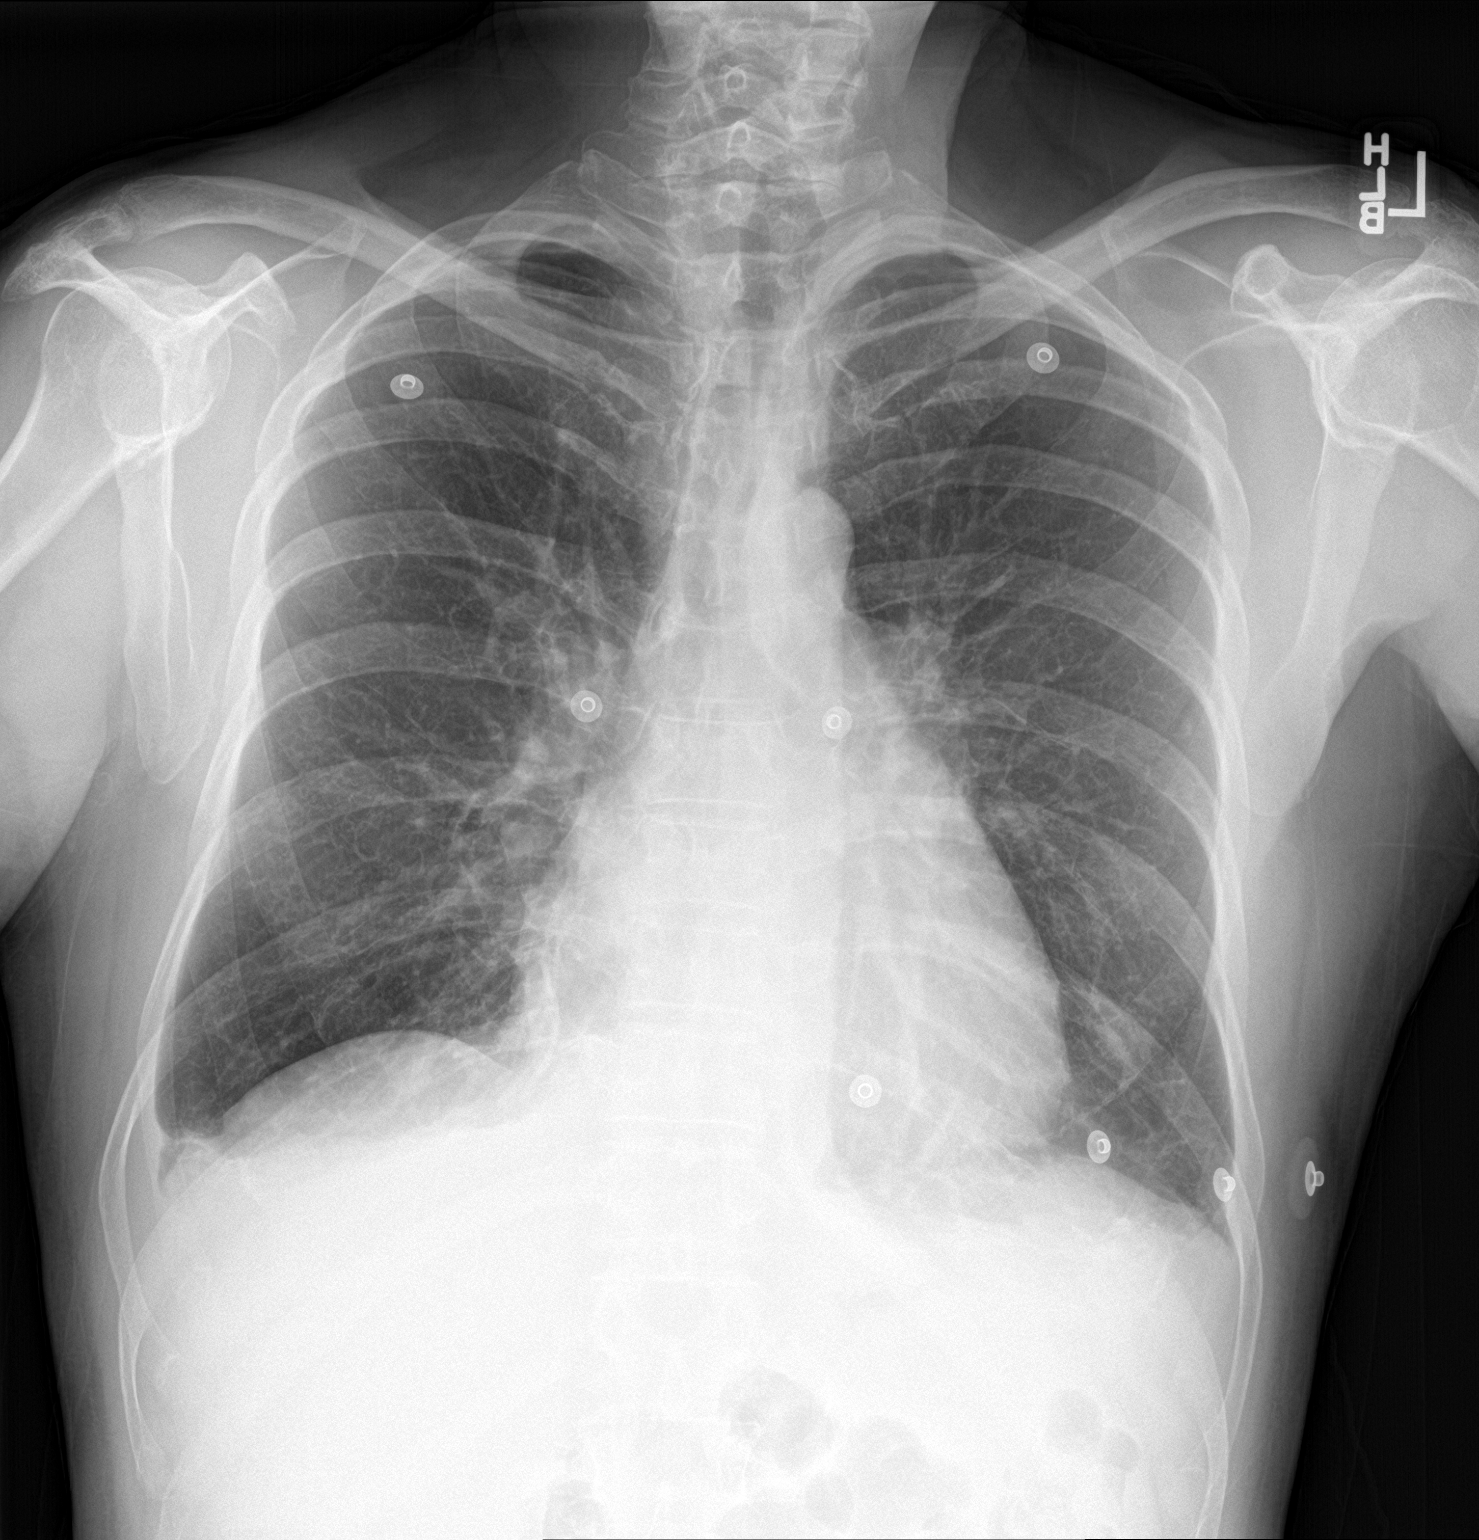

[chest lat]
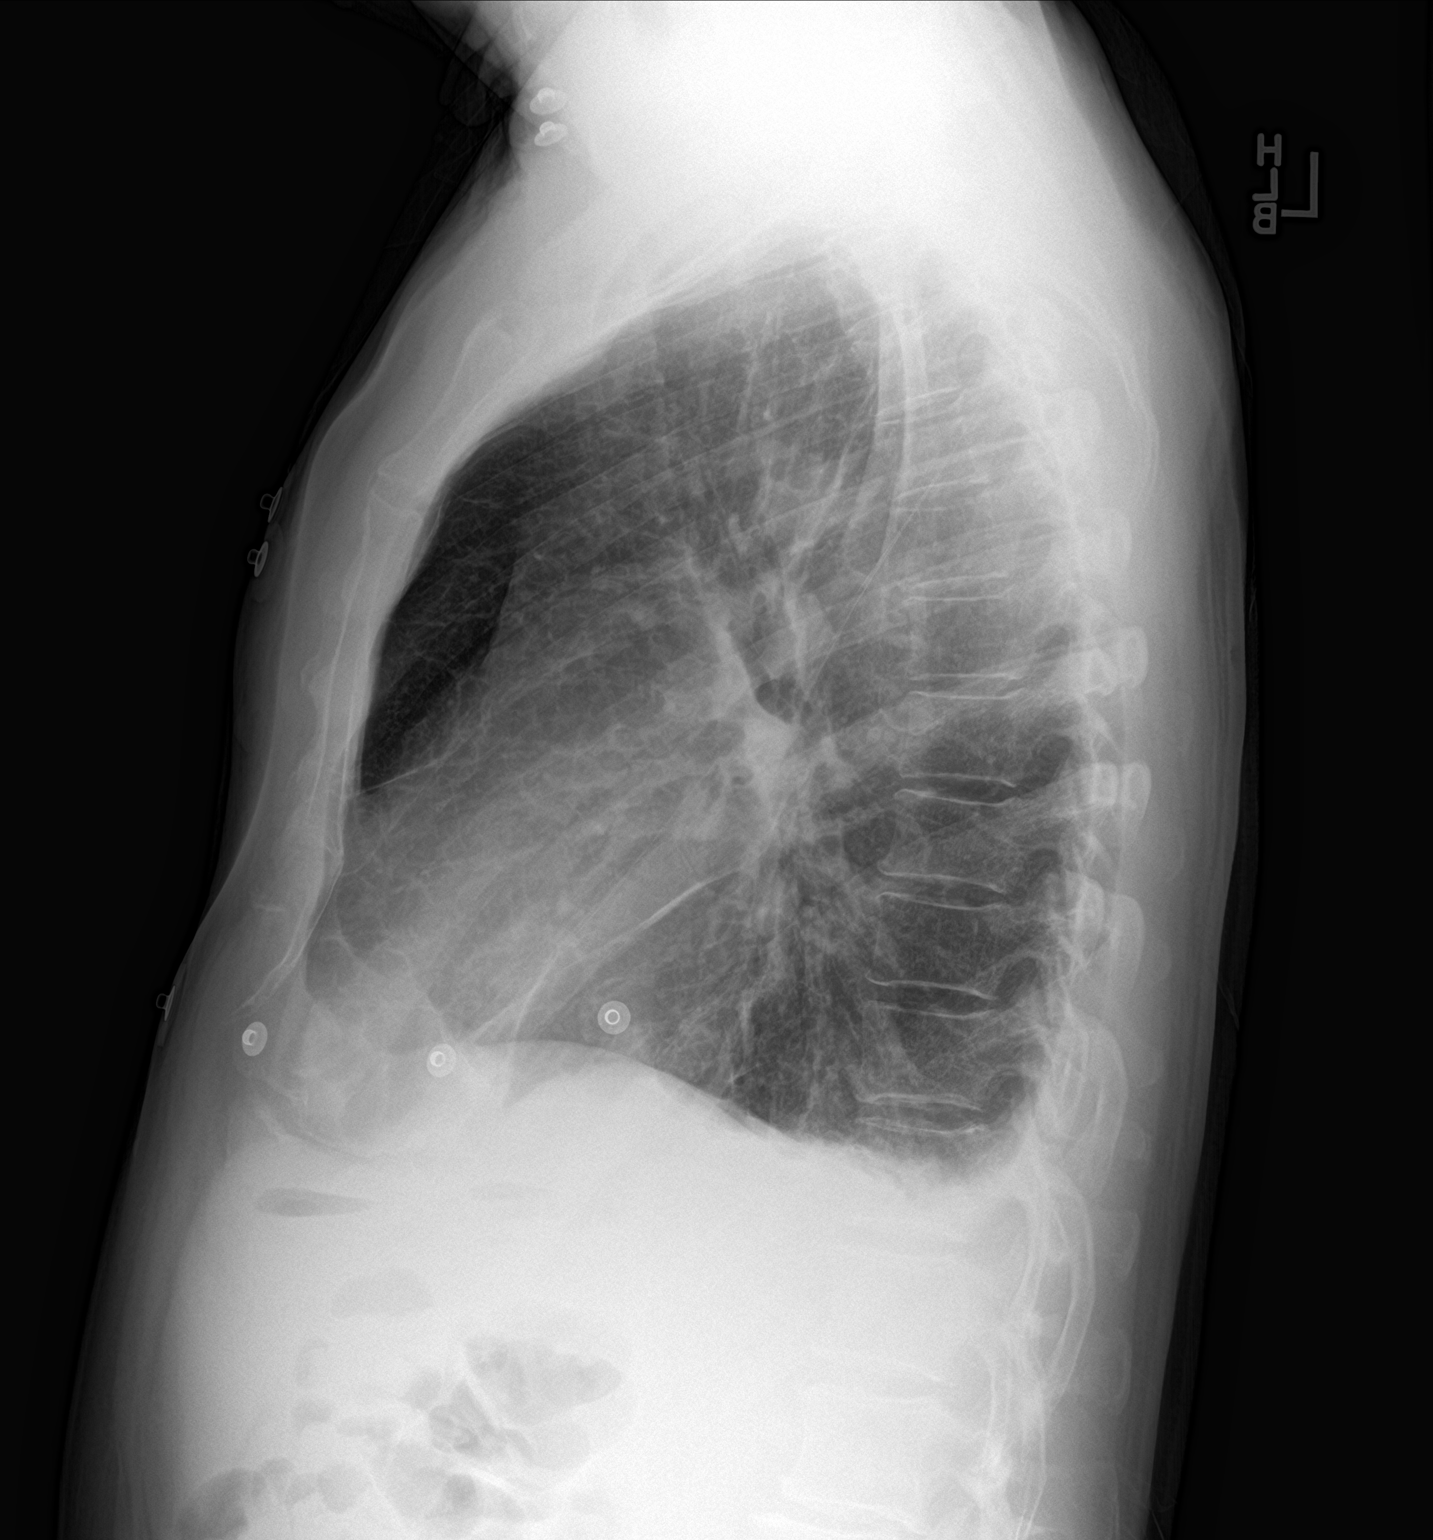

[2 of 2 positions shown; findings below may reference images not displayed]

FINDINGS: Small right and possible trace left pleural effusion. Mild overlying
bibasilar opacities. No visible pneumothorax. No acute osseous
abnormality.
IMPRESSION: Small right and possible trace left pleural effusions. Mild
overlying bibasilar opacities, most likely atelectasis.

## 2022-07-28 ENCOUNTER — Other Ambulatory Visit: Payer: PPO

## 2022-07-28 ENCOUNTER — Telehealth: Payer: Self-pay | Admitting: *Deleted

## 2022-07-28 DIAGNOSIS — I4819 Other persistent atrial fibrillation: Secondary | ICD-10-CM

## 2022-07-28 DIAGNOSIS — I4891 Unspecified atrial fibrillation: Secondary | ICD-10-CM

## 2022-07-28 DIAGNOSIS — Z01818 Encounter for other preprocedural examination: Secondary | ICD-10-CM

## 2022-07-28 LAB — CBC WITH DIFFERENTIAL/PLATELET
Basophils Absolute: 0.1 10*3/uL (ref 0.0–0.2)
Basos: 1 %
EOS (ABSOLUTE): 0.1 10*3/uL (ref 0.0–0.4)
Eos: 1 %
Hematocrit: 38.5 % (ref 37.5–51.0)
Hemoglobin: 13.3 g/dL (ref 13.0–17.7)
Lymphocytes Absolute: 1.1 10*3/uL (ref 0.7–3.1)
Lymphs: 14 %
MCH: 30 pg (ref 26.6–33.0)
MCHC: 34.5 g/dL (ref 31.5–35.7)
MCV: 87 fL (ref 79–97)
Monocytes Absolute: 1.9 10*3/uL — ABNORMAL HIGH (ref 0.1–0.9)
Monocytes: 25 %
Neutrophils Absolute: 4.5 10*3/uL (ref 1.4–7.0)
Neutrophils: 58 %
Platelets: 78 10*3/uL — CL (ref 150–450)
RBC: 4.44 x10E6/uL (ref 4.14–5.80)
RDW: 16.3 % — ABNORMAL HIGH (ref 11.6–15.4)
WBC: 7.7 10*3/uL (ref 3.4–10.8)

## 2022-07-28 LAB — BASIC METABOLIC PANEL
BUN/Creatinine Ratio: 23 (ref 10–24)
BUN: 26 mg/dL (ref 8–27)
CO2: 29 mmol/L (ref 20–29)
Calcium: 9.5 mg/dL (ref 8.6–10.2)
Chloride: 104 mmol/L (ref 96–106)
Creatinine, Ser: 1.13 mg/dL (ref 0.76–1.27)
Glucose: 126 mg/dL — ABNORMAL HIGH (ref 70–99)
Potassium: 4.3 mmol/L (ref 3.5–5.2)
Sodium: 142 mmol/L (ref 134–144)
eGFR: 68 mL/min/{1.73_m2} (ref >59)

## 2022-07-28 LAB — IMMATURE CELLS: Bands(Auto) Relative: 1 %

## 2022-07-28 NOTE — Telephone Encounter (Signed)
Advised the patient that his platelets are critically low and it is not safe to continue as planned with his cardiac ablation. He will need to follow up with his PCP Dr. Radford Pax for an evaluation/workup.    Canceled procedure, canceled follow up with AF clinic, and moved up appointment with Dr. Quentin Ore.   Will forward labs to PCP and patient will call them in the morning.

## 2022-08-02 ENCOUNTER — Telehealth: Payer: Self-pay | Admitting: Pharmacist

## 2022-08-02 NOTE — Telephone Encounter (Signed)
Patient called to ask about meds.  Thought he was on 2 blood thinners since he was on Xarleto and metoprolol.  Advised metoprolol was not a blood thinner and he should continue to take both.  Follow up with PCP and hematology as directed.

## 2022-08-11 ENCOUNTER — Ambulatory Visit (HOSPITAL_COMMUNITY): Admit: 2022-08-11 | Payer: PPO | Admitting: Cardiology

## 2022-08-11 ENCOUNTER — Encounter (HOSPITAL_COMMUNITY): Payer: Self-pay

## 2022-08-11 SURGERY — ATRIAL FIBRILLATION ABLATION
Anesthesia: General

## 2022-08-25 ENCOUNTER — Telehealth: Payer: Self-pay | Admitting: Cardiology

## 2022-08-25 DIAGNOSIS — Z01818 Encounter for other preprocedural examination: Secondary | ICD-10-CM

## 2022-08-25 DIAGNOSIS — I4819 Other persistent atrial fibrillation: Secondary | ICD-10-CM

## 2022-08-25 NOTE — Telephone Encounter (Signed)
Robert Luis, MD - 08/17/2022 3:30 PM EDT  I told him that I saw no contraindication to him undergoing a cardiac ablation for his symptomatic atrial fibrillation. I told him I would send his cardiologist, Dr. Quentin Ore, my note from today.  Left the patient a message that information was received. Will call back when I have an update from Dr. Quentin Ore.

## 2022-08-25 NOTE — Telephone Encounter (Signed)
Patient called stating Dr. Quentin Ore was suppose to do an ablation on him. He said Dr. Harlow Asa was going to put in a report to clear him to have it done. He wants to know if a report has been received from Dr. Harlow Asa.

## 2022-08-29 ENCOUNTER — Ambulatory Visit: Payer: PPO | Admitting: Cardiology

## 2022-08-30 NOTE — Telephone Encounter (Signed)
Pt called for an update on rescheduling his ablation. Informed him we received message from Dr. Harlow Asa and are awaiting Dr. Mardene Speak response. Pt would like to inform Dr. Quentin Ore that his symptoms are still going on.

## 2022-09-01 NOTE — Telephone Encounter (Signed)
Spoke with pt and advised Dr Quentin Ore and his RN are not in the office today but will forward information for RN to address when she returns to the office on 09/02/2022.  Pt verbalizes understanding and agrees with current plan.

## 2022-09-01 NOTE — Telephone Encounter (Signed)
Follow Up:      Patient is calling to see what was decided about his Ablation.

## 2022-09-02 NOTE — Telephone Encounter (Signed)
Left message to call back  

## 2022-09-07 NOTE — Telephone Encounter (Signed)
Patient is following up. 

## 2022-09-07 NOTE — Telephone Encounter (Signed)
   Pt is returning call, he said he will be mowing the lawn and to call him back around 4:30 pm

## 2022-09-08 ENCOUNTER — Ambulatory Visit (HOSPITAL_COMMUNITY): Payer: PPO | Admitting: Physician Assistant

## 2022-09-08 ENCOUNTER — Encounter: Payer: Self-pay | Admitting: *Deleted

## 2022-09-08 NOTE — Telephone Encounter (Signed)
Patient picked ablation date Oct 17 and lab date on Oct 5. Will meet with RN on lab day for procedure instructions.   Work up completed.

## 2022-09-12 NOTE — Addendum Note (Signed)
Addended by: Darrell Jewel on: 09/12/2022 02:48 PM   Modules accepted: Orders

## 2022-09-22 ENCOUNTER — Ambulatory Visit: Payer: PPO | Attending: Cardiology

## 2022-09-22 DIAGNOSIS — Z01818 Encounter for other preprocedural examination: Secondary | ICD-10-CM

## 2022-09-22 DIAGNOSIS — I4819 Other persistent atrial fibrillation: Secondary | ICD-10-CM

## 2022-09-23 LAB — BASIC METABOLIC PANEL
BUN/Creatinine Ratio: 21 (ref 10–24)
BUN: 29 mg/dL — ABNORMAL HIGH (ref 8–27)
CO2: 26 mmol/L (ref 20–29)
Calcium: 9.4 mg/dL (ref 8.6–10.2)
Chloride: 106 mmol/L (ref 96–106)
Creatinine, Ser: 1.35 mg/dL — ABNORMAL HIGH (ref 0.76–1.27)
Glucose: 107 mg/dL — ABNORMAL HIGH (ref 70–99)
Potassium: 4.9 mmol/L (ref 3.5–5.2)
Sodium: 144 mmol/L (ref 134–144)
eGFR: 55 mL/min/{1.73_m2} — ABNORMAL LOW (ref >59)

## 2022-09-23 LAB — CBC WITH DIFFERENTIAL/PLATELET
Basophils Absolute: 0 10*3/uL (ref 0.0–0.2)
Basos: 0 %
EOS (ABSOLUTE): 0.1 10*3/uL (ref 0.0–0.4)
Eos: 1 %
Hematocrit: 37.2 % — ABNORMAL LOW (ref 37.5–51.0)
Hemoglobin: 12.7 g/dL — ABNORMAL LOW (ref 13.0–17.7)
Immature Grans (Abs): 0.3 10*3/uL — ABNORMAL HIGH (ref 0.0–0.1)
Immature Granulocytes: 3 %
Lymphocytes Absolute: 1.5 10*3/uL (ref 0.7–3.1)
Lymphs: 15 %
MCH: 30.4 pg (ref 26.6–33.0)
MCHC: 34.1 g/dL (ref 31.5–35.7)
MCV: 89 fL (ref 79–97)
Monocytes Absolute: 2.2 10*3/uL — ABNORMAL HIGH (ref 0.1–0.9)
Monocytes: 22 %
Neutrophils Absolute: 5.9 10*3/uL (ref 1.4–7.0)
Neutrophils: 59 %
Platelets: 92 10*3/uL — CL (ref 150–450)
RBC: 4.18 x10E6/uL (ref 4.14–5.80)
RDW: 14.4 % (ref 11.6–15.4)
WBC: 10 10*3/uL (ref 3.4–10.8)

## 2022-10-03 NOTE — Pre-Procedure Instructions (Signed)
Instructed patient on the following items: Arrival time 0530 Nothing to eat or drink after midnight No meds AM of procedure Responsible person to drive you home and stay with you for 24 hrs  Have you missed any doses of anti-coagulant Xarelto- hasn't missed any doses    

## 2022-10-03 NOTE — Anesthesia Preprocedure Evaluation (Signed)
Anesthesia Evaluation  Patient identified by MRN, date of birth, ID band Patient awake    Reviewed: Allergy & Precautions, NPO status , Patient's Chart, lab work & pertinent test results, Unable to perform ROS - Chart review only  Airway Mallampati: III  TM Distance: >3 FB Neck ROM: Full    Dental  (+) Missing, Dental Advisory Given,    Pulmonary former smoker,  Non-productive cough in preop, per pt has had it for many months now and was told it was 2/2 his heart issues Also admits to URI several weeks ago, has completed a course of antibiotics but still has some nasal/sinus drainage    Pulmonary exam normal breath sounds clear to auscultation       Cardiovascular hypertension (143/99 in preop ), Pt. on medications and Pt. on home beta blockers +CHF (LVEF 25-30%, mild dec in RV systolic function)  + dysrhythmias Atrial Fibrillation  Rhythm:Irregular Rate:Normal  Echo 06/2022 1. Left ventricular ejection fraction, by estimation, is 25 to 30%. The left ventricle has severely decreased function.  2. Right ventricular systolic function is mildly reduced. The right ventricular size is normal.  3. Left atrial size was moderately dilated. No left atrial/left atrial appendage thrombus was detected.  4. Right atrial size was severely dilated.  5. The mitral valve is normal in structure. Trivial mitral valve regurgitation.  6. The aortic valve is tricuspid. Aortic valve regurgitation is trivial.  7. There is mild (Grade II) plaque.  8. Agitated saline contrast bubble study was negative, with no evidence of any interatrial shunt.    Neuro/Psych negative neurological ROS  negative psych ROS   GI/Hepatic negative GI ROS, Neg liver ROS,   Endo/Other  negative endocrine ROS  Renal/GU negative Renal ROS  negative genitourinary   Musculoskeletal negative musculoskeletal ROS (+)   Abdominal   Peds  Hematology negative hematology  ROS (+)   Anesthesia Other Findings   Reproductive/Obstetrics negative OB ROS                          Anesthesia Physical Anesthesia Plan  ASA: 4  Anesthesia Plan: General   Post-op Pain Management: Minimal or no pain anticipated   Induction: Intravenous  PONV Risk Score and Plan: 2 and Ondansetron, Dexamethasone and Treatment may vary due to age or medical condition  Airway Management Planned: Oral ETT  Additional Equipment: ClearSight  Intra-op Plan:   Post-operative Plan: Extubation in OR  Informed Consent: I have reviewed the patients History and Physical, chart, labs and discussed the procedure including the risks, benefits and alternatives for the proposed anesthesia with the patient or authorized representative who has indicated his/her understanding and acceptance.     Dental advisory given  Plan Discussed with: CRNA  Anesthesia Plan Comments:        Anesthesia Quick Evaluation

## 2022-10-04 ENCOUNTER — Ambulatory Visit (HOSPITAL_COMMUNITY)
Admission: RE | Admit: 2022-10-04 | Discharge: 2022-10-04 | Disposition: A | Discharge status: Home / Self Care | Payer: PPO | Source: Ambulatory Visit | Attending: Cardiology | Admitting: Cardiology

## 2022-10-04 ENCOUNTER — Ambulatory Visit (HOSPITAL_COMMUNITY): Payer: PPO | Admitting: Anesthesiology

## 2022-10-04 ENCOUNTER — Ambulatory Visit (HOSPITAL_BASED_OUTPATIENT_CLINIC_OR_DEPARTMENT_OTHER): Payer: PPO | Admitting: Anesthesiology

## 2022-10-04 ENCOUNTER — Other Ambulatory Visit: Payer: Self-pay

## 2022-10-04 ENCOUNTER — Encounter (HOSPITAL_COMMUNITY)
Admission: RE | Disposition: A | Discharge status: Home / Self Care | Payer: Self-pay | Source: Ambulatory Visit | Attending: Cardiology

## 2022-10-04 DIAGNOSIS — I110 Hypertensive heart disease with heart failure: Secondary | ICD-10-CM

## 2022-10-04 DIAGNOSIS — I5022 Chronic systolic (congestive) heart failure: Secondary | ICD-10-CM | POA: Insufficient documentation

## 2022-10-04 DIAGNOSIS — Z87891 Personal history of nicotine dependence: Secondary | ICD-10-CM | POA: Diagnosis not present

## 2022-10-04 DIAGNOSIS — I483 Typical atrial flutter: Secondary | ICD-10-CM | POA: Insufficient documentation

## 2022-10-04 DIAGNOSIS — Z7901 Long term (current) use of anticoagulants: Secondary | ICD-10-CM | POA: Diagnosis not present

## 2022-10-04 DIAGNOSIS — I4819 Other persistent atrial fibrillation: Secondary | ICD-10-CM

## 2022-10-04 DIAGNOSIS — I509 Heart failure, unspecified: Secondary | ICD-10-CM | POA: Diagnosis not present

## 2022-10-04 DIAGNOSIS — I4891 Unspecified atrial fibrillation: Secondary | ICD-10-CM

## 2022-10-04 HISTORY — PX: ATRIAL FIBRILLATION ABLATION: EP1191

## 2022-10-04 LAB — POCT ACTIVATED CLOTTING TIME
Activated Clotting Time: 341 seconds
Activated Clotting Time: 365 seconds

## 2022-10-04 SURGERY — ATRIAL FIBRILLATION ABLATION
Anesthesia: General

## 2022-10-04 MED ORDER — SODIUM CHLORIDE 0.9% FLUSH: 3.0000 mL | INTRAVENOUS | Status: DC | PRN

## 2022-10-04 MED ORDER — PHENYLEPHRINE 80 MCG/ML (10ML) SYRINGE FOR IV PUSH (FOR BLOOD PRESSURE SUPPORT)
PREFILLED_SYRINGE | INTRAVENOUS | Status: DC | PRN
  Administered 2022-10-04: 160 ug via INTRAVENOUS

## 2022-10-04 MED ORDER — PANTOPRAZOLE SODIUM 40 MG PO TBEC
40.0000 mg | DELAYED_RELEASE_TABLET | Freq: Every day | ORAL | Status: DC
  Administered 2022-10-04: 40 mg via ORAL
  Filled 2022-10-04: qty 1

## 2022-10-04 MED ORDER — SODIUM CHLORIDE 0.9 % IV SOLN: INTRAVENOUS | Status: DC

## 2022-10-04 MED ORDER — COLCHICINE 0.6 MG PO TABS
0.6000 mg | ORAL_TABLET | Freq: Two times a day (BID) | ORAL | Status: DC
  Administered 2022-10-04: 0.6 mg via ORAL
  Filled 2022-10-04: qty 1

## 2022-10-04 MED ORDER — FENTANYL CITRATE (PF) 250 MCG/5ML IJ SOLN
INTRAMUSCULAR | Status: DC | PRN
  Administered 2022-10-04 (×2): 50 ug via INTRAVENOUS

## 2022-10-04 MED ORDER — ACETAMINOPHEN 325 MG PO TABS: 650.0000 mg | ORAL_TABLET | ORAL | Status: DC | PRN

## 2022-10-04 MED ORDER — SODIUM CHLORIDE 0.9 % IV SOLN: 250.0000 mL | INTRAVENOUS | Status: DC | PRN

## 2022-10-04 MED ORDER — RIVAROXABAN 20 MG PO TABS
20.0000 mg | ORAL_TABLET | Freq: Every day | ORAL | Status: DC
  Administered 2022-10-04: 20 mg via ORAL
  Filled 2022-10-04: qty 1

## 2022-10-04 MED ORDER — ONDANSETRON HCL 4 MG/2ML IJ SOLN: 4.0000 mg | Freq: Four times a day (QID) | INTRAMUSCULAR | Status: DC | PRN

## 2022-10-04 MED ORDER — HEPARIN (PORCINE) IN NACL 1000-0.9 UT/500ML-% IV SOLN
INTRAVENOUS | Status: AC
  Filled 2022-10-04: qty 500

## 2022-10-04 MED ORDER — ONDANSETRON HCL 4 MG/2ML IJ SOLN
INTRAMUSCULAR | Status: DC | PRN
  Administered 2022-10-04: 4 mg via INTRAVENOUS

## 2022-10-04 MED ORDER — SODIUM CHLORIDE 0.9% FLUSH: 3.0000 mL | Freq: Two times a day (BID) | INTRAVENOUS | Status: DC

## 2022-10-04 MED ORDER — HEPARIN SODIUM (PORCINE) 1000 UNIT/ML IJ SOLN
INTRAMUSCULAR | Status: AC
  Filled 2022-10-04: qty 10

## 2022-10-04 MED ORDER — LIDOCAINE 2% (20 MG/ML) 5 ML SYRINGE
INTRAMUSCULAR | Status: DC | PRN
  Administered 2022-10-04: 60 mg via INTRAVENOUS

## 2022-10-04 MED ORDER — PROPOFOL 10 MG/ML IV BOLUS
INTRAVENOUS | Status: DC | PRN
  Administered 2022-10-04: 80 mg via INTRAVENOUS

## 2022-10-04 MED ORDER — ROCURONIUM BROMIDE 10 MG/ML (PF) SYRINGE
PREFILLED_SYRINGE | INTRAVENOUS | Status: DC | PRN
  Administered 2022-10-04: 100 mg via INTRAVENOUS

## 2022-10-04 MED ORDER — PANTOPRAZOLE SODIUM 40 MG PO TBEC: 40.0000 mg | DELAYED_RELEASE_TABLET | Freq: Every day | ORAL | 0 refills | Status: DC

## 2022-10-04 MED ORDER — HEPARIN (PORCINE) IN NACL 1000-0.9 UT/500ML-% IV SOLN
INTRAVENOUS | Status: DC | PRN
  Administered 2022-10-04 (×3): 500 mL

## 2022-10-04 MED ORDER — DEXAMETHASONE SODIUM PHOSPHATE 10 MG/ML IJ SOLN
INTRAMUSCULAR | Status: DC | PRN
  Administered 2022-10-04: 10 mg via INTRAVENOUS

## 2022-10-04 MED ORDER — HEPARIN SODIUM (PORCINE) 1000 UNIT/ML IJ SOLN
INTRAMUSCULAR | Status: DC | PRN
  Administered 2022-10-04: 9000 [IU] via INTRAVENOUS

## 2022-10-04 MED ORDER — COLCHICINE 0.6 MG PO TABS: 0.6000 mg | ORAL_TABLET | Freq: Two times a day (BID) | ORAL | 0 refills | Status: DC

## 2022-10-04 MED ORDER — PHENYLEPHRINE HCL-NACL 20-0.9 MG/250ML-% IV SOLN
INTRAVENOUS | Status: DC | PRN
  Administered 2022-10-04: 25 ug/min via INTRAVENOUS

## 2022-10-04 MED ORDER — PROTAMINE SULFATE 10 MG/ML IV SOLN
INTRAVENOUS | Status: DC | PRN
  Administered 2022-10-04: 35 mg via INTRAVENOUS

## 2022-10-04 MED ORDER — SUGAMMADEX SODIUM 200 MG/2ML IV SOLN
INTRAVENOUS | Status: DC | PRN
  Administered 2022-10-04: 200 mg via INTRAVENOUS

## 2022-10-04 SURGICAL SUPPLY — 19 items
BLANKET WARM UNDERBOD FULL ACC (MISCELLANEOUS) ×1 IMPLANT
CATH 8FR REPROCESSED SOUNDSTAR (CATHETERS) ×1 IMPLANT
CATH 8FR SOUNDSTAR REPROCESSED (CATHETERS) IMPLANT
CATH ABLAT QDOT MICRO BI TC DF (CATHETERS) IMPLANT
CATH OCTARAY 2.0 F 3-3-3-3-3 (CATHETERS) IMPLANT
CATH S-M CIRCA TEMP PROBE (CATHETERS) IMPLANT
CATH WEB BI DIR CSDF CRV REPRO (CATHETERS) IMPLANT
CLOSURE PERCLOSE PROSTYLE (VASCULAR PRODUCTS) IMPLANT
COVER SWIFTLINK CONNECTOR (BAG) ×1 IMPLANT
PACK EP LATEX FREE (CUSTOM PROCEDURE TRAY) ×1
PACK EP LF (CUSTOM PROCEDURE TRAY) ×1 IMPLANT
PAD DEFIB RADIO PHYSIO CONN (PAD) ×1 IMPLANT
PATCH CARTO3 (PAD) IMPLANT
SHEATH BAYLIS TRANSSEPTAL 98CM (NEEDLE) IMPLANT
SHEATH CARTO VIZIGO SM CVD (SHEATH) IMPLANT
SHEATH PINNACLE 8F 10CM (SHEATH) IMPLANT
SHEATH PINNACLE 9F 10CM (SHEATH) IMPLANT
SHEATH PROBE COVER 6X72 (BAG) IMPLANT
TUBING SMART ABLATE COOLFLOW (TUBING) IMPLANT

## 2022-10-04 NOTE — Transfer of Care (Signed)
Immediate Anesthesia Transfer of Care Note  Patient: Robert Stark  Procedure(s) Performed: ATRIAL FIBRILLATION ABLATION  Patient Location: Cath Lab  Anesthesia Type:General  Level of Consciousness: awake, drowsy and patient cooperative  Airway & Oxygen Therapy: Patient Spontanous Breathing and Patient connected to nasal cannula oxygen  Post-op Assessment: Report given to RN, Post -op Vital signs reviewed and stable and Patient moving all extremities X 4  Post vital signs: Reviewed and stable  Last Vitals:  Vitals Value Taken Time  BP    Temp    Pulse 62 10/04/22 0942  Resp 13 10/04/22 0942  SpO2 100 % 10/04/22 0942  Vitals shown include unvalidated device data.  Last Pain:  Vitals:   10/04/22 0556  TempSrc:   PainSc: 0-No pain         Complications: There were no known notable events for this encounter.

## 2022-10-04 NOTE — H&P (Signed)
Electrophysiology Office Note:     Date:  10/04/2022    ID:  Robert Stark, DOB [birth year], MRN [id number]   PCP:  [name], DO         [facility] Cardiologist:  None  [facility] Electrophysiologist:  None    Referring MD: Oliver Barre, PA    Chief Complaint: Consult for Afib ablation   History of Present Illness:     Robert Stark is a 76 y.o. male who presents for an evaluation of possible ablation at the request of Adline Peals, NP. Their medical history includes atrial fibrillation (23/5573), chronic systolic CHF, and hypertension.    He was last seen by Adline Peals, NP on 01/11/2022. He was s/p DCCV as of 12/28/21. He was feeling well, but was noted to be back in Afib with rapid rates (130 bpm). Since his DCCV he had only been taking Eliquis once daily. Rhythm control options were discussed including dofetilide, amiodarone, and ablation. He was amenable to a consult with EP to discuss ablation. 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 Toprol was increased to 50 mg BID.    Initially seen in cardiology by Dr. Sallyanne Kuster 10/2021.   Overall, he is feeling okay. He remains compliant with Eliquis twice daily. Currently he is not taking metoprolol, he states this was discontinued.   He denies any palpitations, chest pain, shortness of breath, or peripheral edema. No lightheadedness, headaches, syncope, orthopnea, or PND.   Presents today for PVI. Procedure reviewed.     Objective      Past Medical History:  Diagnosis Date   Hypertension             Past Surgical History:  Procedure Laterality Date   CARDIOVERSION N/A 12/28/2021    Procedure: CARDIOVERSION;  Surgeon: [name], MD;  Location: [facility] ENDOSCOPY;  Service: Cardiovascular;  Laterality: N/A;   CIRCUMCISION       TONSILLECTOMY AND ADENOIDECTOMY          Current Medications: Active Medications      Current Meds  Medication Sig   apixaban (ELIQUIS)  5 MG TABS tablet Take 1 tablet (5 mg total) by mouth 2 (two) times daily.   atorvastatin (LIPITOR) 40 MG tablet Take 40 mg by mouth daily.   benzonatate (TESSALON) 200 MG capsule Take 200 mg by mouth 3 (three) times daily as needed for cough.   Calcium-Magnesium-Zinc (CAL-MAG-ZINC PO) Take 2 tablets by mouth daily.   ibuprofen (ADVIL) 200 MG tablet Take 400 mg by mouth as needed for moderate pain.   losartan-hydrochlorothiazide (HYZAAR) 100-12.5 MG tablet TAKE 1 TABLET BY MOUTH EVERY DAY FOR BLOOD PRESSURE   metoprolol tartrate (LOPRESSOR) 25 MG tablet Take 1 tablet (25 mg total) by mouth Once PRN for up to 1 dose (If your heart rate is over 80 two hour prior to CT scan take.).   Multiple Vitamins-Minerals (AIRBORNE PO) Take 1 tablet by mouth daily.   sildenafil (VIAGRA) 25 MG tablet Take 12.5 mg by mouth as needed for erectile dysfunction.   tamsulosin (FLOMAX) 0.4 MG CAPS capsule TAKE 1 CAPSULE BY MOUTH DAILY FOR PROSTATE   [DISCONTINUED] metoprolol succinate (TOPROL-XL) 50 MG 24 hr tablet Take 1 tablet (50 mg total) by mouth 2 (two) times daily. Take with or immediately following a meal.        Allergies:   Cheese, Bee venom, Chocolate, Other, and Wasp venom    Social History  Socioeconomic History   Marital status: Widowed      Spouse name: Not on file   Number of children: Not on file   Years of education: Not on file   Highest education level: Not on file  Occupational History   Not on file  Tobacco Use   Smoking status: Former      Types: Cigarettes   Smokeless tobacco: Not on file   Tobacco comments:      Former smoker 01/11/22  Substance and Sexual Activity   Alcohol use: Not Currently   Drug use: Not Currently   Sexual activity: Not on file  Other Topics Concern   Not on file  Social History Narrative   Not on file    Social Determinants of Health    Financial Resource Strain: Not on file  Food Insecurity: Not on file  Transportation Needs: Not on file   Physical Activity: Not on file  Stress: Not on file  Social Connections: Not on file      Family History: The patient's family history includes COPD in his brother.   ROS:   Please see the history of present illness.      All other systems reviewed and are negative.   EKGs/Labs/Other Studies Reviewed:     The following studies were reviewed today:   Echo 12/07/2021:  1. Left ventricular ejection fraction, by estimation, is 35 to 40%. The left ventricle has moderately decreased function. The left ventricle demonstrates global hypokinesis. Left ventricular diastolic parameters are indeterminate.   2. Right ventricular systolic function is normal. The right ventricular  size is normal.   3. Left atrial size was mildly dilated.   4. The mitral valve is normal in structure. Trivial mitral valve  regurgitation. No evidence of mitral stenosis.   5. The aortic valve is normal in structure. Aortic valve regurgitation is not visualized. No aortic stenosis is present.   6. The inferior vena cava is normal in size with greater than 50%  respiratory variability, suggesting right atrial pressure of 3 mmHg.    EKG:   EKG is personally reviewed.  02/22/2022: Atrial fibrillation   Recent Labs: 10/28/2021: ALT 23; TSH 0.566 12/23/2021: BUN 24; Creatinine, Ser 1.06; Hemoglobin 13.2; Platelets 103; Potassium 4.6; Sodium 143    Recent Lipid Panel Labs (Brief)  No results found for: CHOL, TRIG, HDL, CHOLHDL, VLDL, LDLCALC, LDLDIRECT     Physical Exam:     VS:  BP 143/99   Pulse 76   Ht '5\' 6"'$  (1.676 m)   Wt 149 lb 9.6 oz (67.9 kg)   SpO2 97%   BMI 24.15 kg/m          Readings from Last 3 Encounters:  02/22/22 149 lb 9.6 oz (67.9 kg)  01/11/22 151 lb (68.5 kg)  12/28/21 150 lb 2.1 oz (68.1 kg)      GEN: Well nourished, well developed in no acute distress HEENT: Normal NECK: No JVD; No carotid bruits LYMPHATICS: No lymphadenopathy CARDIAC: Irregularly irregular, no murmurs, rubs,  gallops RESPIRATORY:  Clear to auscultation without rales, wheezing or rhonchi  ABDOMEN: Soft, non-tender, non-distended MUSCULOSKELETAL:  No edema; No deformity  SKIN: Warm and dry NEUROLOGIC:  Alert and oriented x 3 PSYCHIATRIC:  Normal affect          Assessment ASSESSMENT:     1. Pre-op evaluation   2. Atrial fibrillation, unspecified type ([place])   3. Persistent atrial fibrillation ([place])   4. Typical atrial flutter ([place])  5. Chronic systolic heart failure (H[facility])     PLAN:     In order of problems listed above:   #Persistent atrial fibrillation and flutter Symptomatic.  Also with chronic systolic heart failure.  Rhythm control is indicated.  On Eliquis for stroke prophylaxis.  I discussed rhythm control strategies with the patient including antiarrhythmic drugs and catheter ablation.  He would like to avoid antiarrhythmic drugs if possible which I think is reasonable.  I discussed the ablation procedure in detail with the patient including the risks, recovery and likelihood of success.  I discussed that after an initial ablation procedure, repeat ablations or antiarrhythmic drugs may be necessary to achieve rhythm control.  He is interested in proceeding.    Risk, benefits, and alternatives to EP study and radiofrequency ablation for afib were also discussed in detail today. These risks include but are not limited to stroke, bleeding, vascular damage, tamponade, perforation, damage to the esophagus, lungs, and other structures, pulmonary vein stenosis, worsening renal function, and death. The patient understands these risk and wishes to proceed.  We will therefore proceed with catheter ablation at the next available time.  Carto, ICE, anesthesia are requested for the procedure.  Will also obtain CT PV protocol prior to the procedure to exclude LAA thrombus and further evaluate atrial anatomy.   TEE cleared the appendage.   #Chronic systolic heart failure NYHA class II today.  Warm  and dry on exam.  Continue Toprol.  Rhythm control is indicated.  Continue losartan.  Will need repeat echocardiogram 3 months after ablation procedure if rhythm control is achieved.    Presents today for PVI. Procedure reviewed.  Signed, Hilton Cork. Quentin Ore, MD, [facility], [facility] 10/04/2022 Electrophysiology [facility]

## 2022-10-04 NOTE — Anesthesia Postprocedure Evaluation (Signed)
Anesthesia Post Note  Patient: Robert Stark  Procedure(s) Performed: ATRIAL FIBRILLATION ABLATION     Patient location during evaluation: PACU Anesthesia Type: General Level of consciousness: sedated and patient cooperative Pain management: pain level controlled Vital Signs Assessment: post-procedure vital signs reviewed and stable Respiratory status: spontaneous breathing Cardiovascular status: stable Anesthetic complications: no   There were no known notable events for this encounter.  Last Vitals:  Vitals:   10/04/22 1100 10/04/22 1200  BP: (!) 142/80 (!) 151/89  Pulse: 67 66  Resp: 16 12  Temp:    SpO2: 95% 97%    Last Pain:  Vitals:   10/04/22 1010  TempSrc: Temporal  PainSc: 0-No pain                 Nolon Nations

## 2022-10-04 NOTE — Discharge Instructions (Signed)

## 2022-10-04 NOTE — Anesthesia Procedure Notes (Signed)
Procedure Name: Intubation Date/Time: 10/04/2022 7:50 AM  Performed by: Gaylene Brooks, CRNAPre-anesthesia Checklist: Patient identified, Emergency Drugs available, Suction available and Patient being monitored Patient Re-evaluated:Patient Re-evaluated prior to induction Oxygen Delivery Method: Circle System Utilized Preoxygenation: Pre-oxygenation with 100% oxygen Induction Type: IV induction Ventilation: Mask ventilation without difficulty Laryngoscope Size: Miller and 2 Grade View: Grade I Tube type: Oral Tube size: 7.5 mm Number of attempts: 1 Airway Equipment and Method: Stylet and Oral airway Placement Confirmation: ETT inserted through vocal cords under direct vision, positive ETCO2 and breath sounds checked- equal and bilateral Secured at: 22 cm Tube secured with: Tape Dental Injury: Teeth and Oropharynx as per pre-operative assessment

## 2022-10-05 ENCOUNTER — Encounter (HOSPITAL_COMMUNITY): Payer: Self-pay | Admitting: Cardiology

## 2022-10-11 ENCOUNTER — Ambulatory Visit: Payer: PPO | Admitting: Cardiology

## 2022-11-01 ENCOUNTER — Ambulatory Visit (HOSPITAL_COMMUNITY): Payer: PPO | Admitting: Physician Assistant

## 2022-11-01 NOTE — Progress Notes (Incomplete)
Primary Care Physician: [name], DO Primary Cardiologist: Dr Sallyanne Kuster  Primary Electrophysiologist: Dr Quentin Ore Referring Physician: Dr Ulice Dash is a 76 y.o. male with a history of HTN, chronic systolic CHF, and atrial fibrillation who presents for follow up in the [facility]. The patient was initially diagnosed with atrial fibrillation 10/28/21 after presenting to the ED with symptoms of SOB, ECG showed afib with RVR. His heart rate was slowed with IV diltiazem and he was started on Xarelto for a CHADS2VASC score of 3. He did not tolerate Xarelto due to "his whole body being on fire and itching." He was changed to Eliquis. Echo showed EF 35-40% and diltiazem was changed to metoprolol. Patient is s/p DCCV on 12/28/21. Unfortunately, he was back in afib with rapid rates on follow up. Of note, since DCCV he had only been taking Eliquis once daily.   Patient was seen by Dr Quentin Ore 02/22/22 and afib ablation was planned. He had a cardiac CT 05/24/22 which showed LAA thrombus and ablation was cancelled. His anticoagulation was changed to Xarelto and TEE 06/29/22 showed no thrombus. He underwent afib ablation with Dr Quentin Ore on 10/04/22.   On follow up today, ***  Today, he denies symptoms of ***palpitations, chest pain, orthopnea, PND, lower extremity edema, dizziness, presyncope, syncope, snoring, daytime somnolence, bleeding, or neurologic sequela. The patient is tolerating medications without difficulties and is otherwise without complaint today.    Atrial Fibrillation Risk Factors:  he does not have symptoms or diagnosis of sleep apnea. he does not have a history of rheumatic fever.   he has a BMI of There is no height or weight on file to calculate BMI.. There were no vitals filed for this visit.   Family History  Problem Relation Age of Onset   COPD Brother      Atrial Fibrillation Management history:  Previous antiarrhythmic  drugs: none Previous cardioversions: 12/28/21 Previous ablations: 10/04/22 CHADS2VASC score: 3 Anticoagulation history: Eliquis   Past Medical History:  Diagnosis Date   Hypertension    Past Surgical History:  Procedure Laterality Date   ATRIAL FIBRILLATION ABLATION N/A 10/04/2022   Procedure: ATRIAL FIBRILLATION ABLATION;  Surgeon: [name], MD;  Location: [place] [facility];  Service: Cardiovascular;  Laterality: N/A;   BUBBLE STUDY  06/29/2022   Procedure: BUBBLE STUDY;  Surgeon: [name], MD;  Location: [place];  Service: Cardiovascular;;   CARDIOVERSION N/A 12/28/2021   Procedure: CARDIOVERSION;  Surgeon: [name], MD;  Location: [place];  Service: Cardiovascular;  Laterality: N/A;   CIRCUMCISION     TEE WITHOUT CARDIOVERSION N/A 06/29/2022   Procedure: TRANSESOPHAGEAL ECHOCARDIOGRAM (TEE);  Surgeon: [name], MD;  Location: [facility];  Service: Cardiovascular;  Laterality: N/A;   TONSILLECTOMY AND ADENOIDECTOMY      Current Outpatient Medications  Medication Sig Dispense Refill   atorvastatin (LIPITOR) 40 MG tablet Take 40 mg by mouth daily.     azelastine (ASTELIN) 0.1 % nasal spray Place 1 spray into both nostrils 2 (two) times daily.     benzonatate (TESSALON) 200 MG capsule Take 200 mg by mouth 3 (three) times daily as needed for cough.     colchicine 0.6 MG tablet Take 1 tablet (0.6 mg total) by mouth 2 (two) times daily for 5 days. 10 tablet 0   losartan-hydrochlorothiazide (HYZAAR) 100-12.5 MG tablet TAKE 1 TABLET BY MOUTH EVERY DAY FOR BLOOD PRESSURE  MAG64 64 MG TBEC Take 2 tablets by mouth daily.     metoprolol succinate (TOPROL-XL) 50 MG 24 hr tablet Take 1 tablet (50 mg total) by mouth 2 (two) times daily. Take with or immediately following a meal. 180 tablet 3   pantoprazole (PROTONIX) 40 MG tablet Take 1 tablet (40 mg total) by mouth daily. 45 tablet 0   rivaroxaban (XARELTO) 20 MG TABS tablet Take 1 tablet (20  mg total) by mouth daily with supper. STOP ELIQUIS 30 tablet 3   tamsulosin (FLOMAX) 0.4 MG CAPS capsule TAKE 1 CAPSULE BY MOUTH DAILY FOR PROSTATE     No current facility-administered medications for this visit.    Allergies  Allergen Reactions   Cheese Hives   Bee Venom Swelling   Chocolate Hives    In large quantities    Other Itching    Buttermilk    Wasp Venom Itching    Social History   Socioeconomic History   Marital status: Widowed    Spouse name: Not on file   Number of children: Not on file   Years of education: Not on file   Highest education level: Not on file  Occupational History   Not on file  Tobacco Use   Smoking status: Former    Types: Cigarettes   Smokeless tobacco: Not on file   Tobacco comments:    Former smoker 01/11/22  Substance and Sexual Activity   Alcohol use: Not Currently   Drug use: Not Currently   Sexual activity: Not on file  Other Topics Concern   Not on file  Social History Narrative   Not on file   Social Determinants of Health   Financial Resource Strain: Not on file  Food Insecurity: Not on file  Transportation Needs: Not on file  Physical Activity: Not on file  Stress: Not on file  Social Connections: Not on file  Intimate Partner Violence: Not on file     ROS- All systems are reviewed and negative except as per the HPI above.  Physical Exam: There were no vitals filed for this visit.   GEN- The patient is a well appearing *** {Desc; male/male:[id number]}, alert and oriented x 3 today.   HEENT-head normocephalic, atraumatic, sclera clear, conjunctiva pink, hearing intact, trachea midline. Lungs- Clear to ausculation bilaterally, normal work of breathing Heart- ***Regular rate and rhythm, no murmurs, rubs or gallops  GI- soft, NT, ND, + BS Extremities- no clubbing, cyanosis, or edema MS- no significant deformity or atrophy Skin- no rash or lesion Psych- euthymic mood, full affect Neuro- strength and sensation are  intact   Wt Readings from Last 3 Encounters:  10/04/22 65.3 kg  06/29/22 65.3 kg  05/31/22 65.5 kg    EKG today demonstrates  ***  Echo 12/07/21 demonstrated  1. Left ventricular ejection fraction, by estimation, is 35 to 40%. The  left ventricle has moderately decreased function. The left ventricle  demonstrates global hypokinesis. Left ventricular diastolic parameters are indeterminate.   2. Right ventricular systolic function is normal. The right ventricular  size is normal.   3. Left atrial size was mildly dilated.   4. The mitral valve is normal in structure. Trivial mitral valve  regurgitation. No evidence of mitral stenosis.   5. The aortic valve is normal in structure. Aortic valve regurgitation is not visualized. No aortic stenosis is present.   6. The inferior vena cava is normal in size with greater than 50%  respiratory variability, suggesting right atrial pressure of  3 mmHg.   Epic records are reviewed at length today  CHA2DS2-VASc Score = 3  The patient's score is based upon: CHF History: 1 HTN History: 1 Diabetes History: 0 Stroke History: 0 Vascular Disease History: 0 Age Score: 1 Gender Score: 0        ASSESSMENT AND PLAN: 1. Persistent Atrial Fibrillation (ICD10:  I48.19) The patient's CHA2DS2-VASc score is 3, indicating a 3.2% annual risk of stroke.   S/p afib ablation 10/04/22 *** Continue Xarelto 20 mg daily with no missed doses for 3 months post ablation. Continue Toprol 50 mg BID  2. Secondary Hypercoagulable State (ICD10:  D68.69) The patient is at significant risk for stroke/thromboembolism based upon his CHA2DS2-VASc Score of 3.  Continue Apixaban (Eliquis).   3. 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 Hopefully EF improves with SR post ablation. Fluid status appears stable today. ***  4. HTN Stable, no changes today. ***   Follow up ***with Dr Quentin Ore as scheduled.    [facility] [address] [place], [place] [address] [phone] 11/01/2022 10:06 AM

## 2022-11-04 ENCOUNTER — Other Ambulatory Visit (HOSPITAL_COMMUNITY): Payer: Self-pay | Admitting: Physician Assistant

## 2022-11-07 ENCOUNTER — Inpatient Hospital Stay (HOSPITAL_COMMUNITY): Payer: PPO

## 2022-11-07 ENCOUNTER — Emergency Department (HOSPITAL_COMMUNITY): Payer: PPO

## 2022-11-07 ENCOUNTER — Encounter (HOSPITAL_COMMUNITY): Payer: Self-pay | Admitting: Cardiology

## 2022-11-07 ENCOUNTER — Inpatient Hospital Stay (HOSPITAL_COMMUNITY)
Admission: EM | Admit: 2022-11-07 | Discharge: 2022-11-13 | DRG: 270 | Disposition: A | Discharge status: Home / Self Care | Payer: PPO | Attending: Cardiovascular Disease | Admitting: Cardiovascular Disease

## 2022-11-07 DIAGNOSIS — I130 Hypertensive heart and chronic kidney disease with heart failure and stage 1 through stage 4 chronic kidney disease, or unspecified chronic kidney disease: Secondary | ICD-10-CM | POA: Diagnosis present

## 2022-11-07 DIAGNOSIS — I3139 Other pericardial effusion (noninflammatory): Secondary | ICD-10-CM

## 2022-11-07 DIAGNOSIS — I4819 Other persistent atrial fibrillation: Secondary | ICD-10-CM | POA: Diagnosis not present

## 2022-11-07 DIAGNOSIS — Z79899 Other long term (current) drug therapy: Secondary | ICD-10-CM

## 2022-11-07 DIAGNOSIS — R7401 Elevation of levels of liver transaminase levels: Secondary | ICD-10-CM | POA: Diagnosis present

## 2022-11-07 DIAGNOSIS — D696 Thrombocytopenia, unspecified: Secondary | ICD-10-CM | POA: Diagnosis present

## 2022-11-07 DIAGNOSIS — I10 Essential (primary) hypertension: Secondary | ICD-10-CM | POA: Diagnosis not present

## 2022-11-07 DIAGNOSIS — Z87891 Personal history of nicotine dependence: Secondary | ICD-10-CM

## 2022-11-07 DIAGNOSIS — Z8249 Family history of ischemic heart disease and other diseases of the circulatory system: Secondary | ICD-10-CM

## 2022-11-07 DIAGNOSIS — I480 Paroxysmal atrial fibrillation: Secondary | ICD-10-CM | POA: Diagnosis not present

## 2022-11-07 DIAGNOSIS — N182 Chronic kidney disease, stage 2 (mild): Secondary | ICD-10-CM | POA: Diagnosis present

## 2022-11-07 DIAGNOSIS — N170 Acute kidney failure with tubular necrosis: Secondary | ICD-10-CM | POA: Diagnosis not present

## 2022-11-07 DIAGNOSIS — Z91030 Bee allergy status: Secondary | ICD-10-CM

## 2022-11-07 DIAGNOSIS — I309 Acute pericarditis, unspecified: Secondary | ICD-10-CM | POA: Diagnosis present

## 2022-11-07 DIAGNOSIS — N400 Enlarged prostate without lower urinary tract symptoms: Secondary | ICD-10-CM | POA: Diagnosis present

## 2022-11-07 DIAGNOSIS — Z23 Encounter for immunization: Secondary | ICD-10-CM | POA: Diagnosis not present

## 2022-11-07 DIAGNOSIS — I428 Other cardiomyopathies: Secondary | ICD-10-CM | POA: Diagnosis present

## 2022-11-07 DIAGNOSIS — I5023 Acute on chronic systolic (congestive) heart failure: Secondary | ICD-10-CM

## 2022-11-07 DIAGNOSIS — Z8679 Personal history of other diseases of the circulatory system: Secondary | ICD-10-CM | POA: Diagnosis not present

## 2022-11-07 DIAGNOSIS — I319 Disease of pericardium, unspecified: Secondary | ICD-10-CM | POA: Insufficient documentation

## 2022-11-07 DIAGNOSIS — N179 Acute kidney failure, unspecified: Secondary | ICD-10-CM | POA: Diagnosis present

## 2022-11-07 DIAGNOSIS — I314 Cardiac tamponade: Secondary | ICD-10-CM | POA: Diagnosis present

## 2022-11-07 DIAGNOSIS — Z91018 Allergy to other foods: Secondary | ICD-10-CM

## 2022-11-07 DIAGNOSIS — Z7901 Long term (current) use of anticoagulants: Secondary | ICD-10-CM

## 2022-11-07 DIAGNOSIS — Z9889 Other specified postprocedural states: Secondary | ICD-10-CM | POA: Diagnosis not present

## 2022-11-07 DIAGNOSIS — I312 Hemopericardium, not elsewhere classified: Secondary | ICD-10-CM | POA: Diagnosis not present

## 2022-11-07 DIAGNOSIS — E1122 Type 2 diabetes mellitus with diabetic chronic kidney disease: Secondary | ICD-10-CM | POA: Diagnosis present

## 2022-11-07 DIAGNOSIS — R21 Rash and other nonspecific skin eruption: Secondary | ICD-10-CM | POA: Diagnosis not present

## 2022-11-07 DIAGNOSIS — Z91038 Other insect allergy status: Secondary | ICD-10-CM

## 2022-11-07 DIAGNOSIS — D693 Immune thrombocytopenic purpura: Secondary | ICD-10-CM | POA: Diagnosis present

## 2022-11-07 DIAGNOSIS — E440 Moderate protein-calorie malnutrition: Secondary | ICD-10-CM | POA: Diagnosis present

## 2022-11-07 DIAGNOSIS — E785 Hyperlipidemia, unspecified: Secondary | ICD-10-CM | POA: Diagnosis present

## 2022-11-07 DIAGNOSIS — Z6824 Body mass index (BMI) 24.0-24.9, adult: Secondary | ICD-10-CM

## 2022-11-07 DIAGNOSIS — Z1152 Encounter for screening for COVID-19: Secondary | ICD-10-CM

## 2022-11-07 DIAGNOSIS — Z888 Allergy status to other drugs, medicaments and biological substances status: Secondary | ICD-10-CM

## 2022-11-07 DIAGNOSIS — D631 Anemia in chronic kidney disease: Secondary | ICD-10-CM | POA: Diagnosis present

## 2022-11-07 LAB — COMPREHENSIVE METABOLIC PANEL
ALT: 82 U/L — ABNORMAL HIGH (ref 0–44)
AST: 46 U/L — ABNORMAL HIGH (ref 15–41)
Albumin: 3.5 g/dL (ref 3.5–5.0)
Alkaline Phosphatase: 133 U/L — ABNORMAL HIGH (ref 38–126)
Anion gap: 13 (ref 5–15)
BUN: 57 mg/dL — ABNORMAL HIGH (ref 8–23)
CO2: 23 mmol/L (ref 22–32)
Calcium: 9.2 mg/dL (ref 8.9–10.3)
Chloride: 99 mmol/L (ref 98–111)
Creatinine, Ser: 2.59 mg/dL — ABNORMAL HIGH (ref 0.61–1.24)
GFR, Estimated: 25 mL/min — ABNORMAL LOW (ref >60)
Glucose, Bld: 113 mg/dL — ABNORMAL HIGH (ref 70–99)
Potassium: 4.5 mmol/L (ref 3.5–5.1)
Sodium: 135 mmol/L (ref 135–145)
Total Bilirubin: 1.2 mg/dL (ref 0.3–1.2)
Total Protein: 6.1 g/dL — ABNORMAL LOW (ref 6.5–8.1)

## 2022-11-07 LAB — CBC WITH DIFFERENTIAL/PLATELET
Abs Immature Granulocytes: 0.2 10*3/uL — ABNORMAL HIGH (ref 0.00–0.07)
Basophils Absolute: 0 10*3/uL (ref 0.0–0.1)
Basophils Relative: 0 %
Eosinophils Absolute: 0 10*3/uL (ref 0.0–0.5)
Eosinophils Relative: 0 %
HCT: 30.7 % — ABNORMAL LOW (ref 39.0–52.0)
Hemoglobin: 10.5 g/dL — ABNORMAL LOW (ref 13.0–17.0)
Immature Granulocytes: 2 %
Lymphocytes Relative: 10 %
Lymphs Abs: 1.1 10*3/uL (ref 0.7–4.0)
MCH: 30.4 pg (ref 26.0–34.0)
MCHC: 34.2 g/dL (ref 30.0–36.0)
MCV: 89 fL (ref 80.0–100.0)
Monocytes Absolute: 4.1 10*3/uL — ABNORMAL HIGH (ref 0.1–1.0)
Monocytes Relative: 37 %
Neutro Abs: 5.6 10*3/uL (ref 1.7–7.7)
Neutrophils Relative %: 51 %
Platelets: 148 10*3/uL — ABNORMAL LOW (ref 150–400)
RBC: 3.45 MIL/uL — ABNORMAL LOW (ref 4.22–5.81)
RDW: 14.4 % (ref 11.5–15.5)
WBC: 11.1 10*3/uL — ABNORMAL HIGH (ref 4.0–10.5)
nRBC: 0 % (ref 0.0–0.2)

## 2022-11-07 LAB — RESP PANEL BY RT-PCR (FLU A&B, COVID) ARPGX2
Influenza A by PCR: NEGATIVE
Influenza B by PCR: NEGATIVE
SARS Coronavirus 2 by RT PCR: NEGATIVE

## 2022-11-07 LAB — ECHOCARDIOGRAM COMPLETE
Area-P 1/2: 5.31 cm2
Calc EF: 34.9 %
Single Plane A2C EF: 41.2 %
Single Plane A4C EF: 35.1 %

## 2022-11-07 LAB — I-STAT ARTERIAL BLOOD GAS, ED
Acid-base deficit: 3 mmol/L — ABNORMAL HIGH (ref 0.0–2.0)
Bicarbonate: 20.2 mmol/L (ref 20.0–28.0)
Calcium, Ion: 1.21 mmol/L (ref 1.15–1.40)
HCT: 29 % — ABNORMAL LOW (ref 39.0–52.0)
Hemoglobin: 9.9 g/dL — ABNORMAL LOW (ref 13.0–17.0)
O2 Saturation: 97 %
Patient temperature: 37
Potassium: 4.4 mmol/L (ref 3.5–5.1)
Sodium: 132 mmol/L — ABNORMAL LOW (ref 135–145)
TCO2: 21 mmol/L — ABNORMAL LOW (ref 22–32)
pCO2 arterial: 28.6 mmHg — ABNORMAL LOW (ref 32–48)
pH, Arterial: 7.456 — ABNORMAL HIGH (ref 7.35–7.45)
pO2, Arterial: 87 mmHg (ref 83–108)

## 2022-11-07 LAB — TROPONIN I (HIGH SENSITIVITY)
Troponin I (High Sensitivity): 19 ng/L — ABNORMAL HIGH (ref <18)
Troponin I (High Sensitivity): 17 ng/L (ref <18)

## 2022-11-07 LAB — BRAIN NATRIURETIC PEPTIDE: B Natriuretic Peptide: 70.2 pg/mL (ref 0.0–100.0)

## 2022-11-07 MED ORDER — COLCHICINE 0.3 MG HALF TABLET
0.3000 mg | ORAL_TABLET | Freq: Every day | ORAL | Status: DC
  Administered 2022-11-09 – 2022-11-13 (×5): 0.3 mg via ORAL
  Filled 2022-11-07 (×6): qty 1

## 2022-11-07 MED ORDER — METOPROLOL TARTRATE 5 MG/5ML IV SOLN: 2.5000 mg | Freq: Four times a day (QID) | INTRAVENOUS | Status: DC | PRN

## 2022-11-07 MED ORDER — BENZONATATE 100 MG PO CAPS
200.0000 mg | ORAL_CAPSULE | Freq: Three times a day (TID) | ORAL | Status: DC | PRN
  Administered 2022-11-08: 200 mg via ORAL
  Filled 2022-11-07: qty 2

## 2022-11-07 MED ORDER — PANTOPRAZOLE SODIUM 40 MG PO TBEC
40.0000 mg | DELAYED_RELEASE_TABLET | Freq: Every day | ORAL | Status: DC
  Administered 2022-11-09 – 2022-11-13 (×5): 40 mg via ORAL
  Filled 2022-11-07 (×5): qty 1

## 2022-11-07 MED ORDER — SODIUM CHLORIDE 0.9% FLUSH
3.0000 mL | Freq: Two times a day (BID) | INTRAVENOUS | Status: DC
  Administered 2022-11-07 – 2022-11-13 (×6): 3 mL via INTRAVENOUS

## 2022-11-07 MED ORDER — SODIUM CHLORIDE 0.9 % IV BOLUS: 500.0000 mL | Freq: Once | INTRAVENOUS | Status: DC

## 2022-11-07 MED ORDER — COLCHICINE 0.6 MG PO TABS
0.6000 mg | ORAL_TABLET | Freq: Two times a day (BID) | ORAL | Status: DC
  Filled 2022-11-07: qty 1

## 2022-11-07 MED ORDER — B COMPLEX-C PO TABS
1.0000 | ORAL_TABLET | Freq: Every day | ORAL | Status: DC
  Administered 2022-11-07 – 2022-11-13 (×6): 1 via ORAL
  Filled 2022-11-07 (×7): qty 1

## 2022-11-07 MED ORDER — SODIUM CHLORIDE 0.9 % IV SOLN
250.0000 mL | INTRAVENOUS | Status: DC | PRN
  Administered 2022-11-09: 250 mL via INTRAVENOUS

## 2022-11-07 MED ORDER — AZELASTINE HCL 0.1 % NA SOLN
1.0000 | Freq: Two times a day (BID) | NASAL | Status: DC
  Administered 2022-11-08 – 2022-11-13 (×10): 1 via NASAL
  Filled 2022-11-07 (×2): qty 30

## 2022-11-07 MED ORDER — TAMSULOSIN HCL 0.4 MG PO CAPS
0.4000 mg | ORAL_CAPSULE | Freq: Every day | ORAL | Status: DC
  Administered 2022-11-08 – 2022-11-12 (×5): 0.4 mg via ORAL
  Filled 2022-11-07 (×5): qty 1

## 2022-11-07 MED ORDER — SODIUM CHLORIDE 0.9% FLUSH
3.0000 mL | Freq: Two times a day (BID) | INTRAVENOUS | Status: DC
  Administered 2022-11-07 – 2022-11-10 (×3): 3 mL via INTRAVENOUS

## 2022-11-07 MED ORDER — ONDANSETRON HCL 4 MG/2ML IJ SOLN: 4.0000 mg | Freq: Four times a day (QID) | INTRAMUSCULAR | Status: DC | PRN

## 2022-11-07 MED ORDER — SODIUM CHLORIDE 0.9% FLUSH: 3.0000 mL | INTRAVENOUS | Status: DC | PRN

## 2022-11-07 MED ORDER — MELATONIN 3 MG PO TABS
3.0000 mg | ORAL_TABLET | Freq: Every evening | ORAL | Status: DC | PRN
  Administered 2022-11-07 – 2022-11-12 (×4): 3 mg via ORAL
  Filled 2022-11-07 (×5): qty 1

## 2022-11-07 MED ORDER — ACETAMINOPHEN 325 MG PO TABS
650.0000 mg | ORAL_TABLET | ORAL | Status: DC | PRN
  Administered 2022-11-09 – 2022-11-10 (×3): 650 mg via ORAL
  Filled 2022-11-07 (×3): qty 2

## 2022-11-07 MED ORDER — ATORVASTATIN CALCIUM 40 MG PO TABS
40.0000 mg | ORAL_TABLET | Freq: Every day | ORAL | Status: DC
  Administered 2022-11-09 – 2022-11-13 (×5): 40 mg via ORAL
  Filled 2022-11-07 (×5): qty 1

## 2022-11-07 NOTE — ED Triage Notes (Signed)
Pt BIB EMS from [facility] for SOB and weakness. PT was 82% on RA at facility. 95% on 3L [place]. History of cardiac ablation in October for uncontrolled Afib. AxO4.

## 2022-11-07 NOTE — ED Notes (Signed)
Assumed care of patient. Patient is alert and oriented x4. Patient is concerned because he has not had anything to eat. He says he spoke with someone and they told him he could eat, however the diet order is NPO. This nurse has messages to the Dr asking about the diet order.

## 2022-11-07 NOTE — Progress Notes (Signed)
  Echocardiogram 2D Echocardiogram has been performed.  Robert Stark 11/07/2022, 6:22 PM

## 2022-11-07 NOTE — H&P (View-Only) (Signed)
Cardiology Consultation   Patient ID: [name] MRN: [id number]; DOB: 1946/03/02  Admit date: 11/07/2022 Date of Consult: 11/07/2022  PCP:  [name], [place] Providers Cardiologist:  None        Patient Profile:   Robert Stark is a 76 y.o. male with a hx of  HTN, chronic systolic CHF, atrial fib with atrial fib ablation/17/23  who is being seen 11/07/2022 for the evaluation of SOB with pericardial effusion on CXR at the request of Dr. Alvino Chapel.  History of Present Illness:   Robert Stark with hx as above and A fib initially diagnosed 10/2021, unaware of palpitations and presenting as dyspnea.  Placed on xarelto,but did not tolerate and switiched to Eliquis for CHA2DS2VASc of 3.his echocardiogram showed left ventricular hypokinesis with EF 35-40% (felt to be due to tachycardia) and original dilt changed to BB.  He was cardioverted but eventually went back into a fib.  Planned a fib ablation and in preparation Cardiac CTA revealing LAA thrombus, and ablation was cancelled.  Calcium score was 0.  06/29/22 TEE with EF 25-30%, RV mildly reduced LA mod dilated no thrombus in the left atrial appendage. RA severely dilated.  No significant valvular abnormalities or ASD/PFO.  Underwent uncomplicated ablation 63/84/66 (successful PVI, ablation/isolation of the posterior wall).  Intracardiac echo revealed trivial pericardial effusion left sided common PV ostium, large mobile eustachian valve. Stable at discharge.    He felt great for the first 2 weeks after ablation.  Over the ensuing 2 weeks he has had progressively worsening shortness of breath on exertion, no short of breath at rest with some orthopnea.  He's also developed dizziness with sudden changes in position although he has not had frank syncope.  He has developed poor appetite but no nausea or vomiting, fever or chills, cough or hemoptysis  Today pt presented to [facility] urgent care for SOB and weakness.   Sp02 82% on RA with 3 L [place] sp02 95%.    Echocardiogram shows a large circumferential pericardial effusion with fibrinous strands and all the typical echo findings of tamponade.  He has not been hypotensive in the emergency room.  Labs show marked worsening of renal function from a baseline creatinine of 1.13-1.35 up to 2.59 today.  Electrolytes are normal.  High-sensitivity troponin is normal as is the BNP.  WBC is borderline elevated and hemoglobin has decreased from 12.7-10.5.    EKG:  The EKG was personally reviewed and demonstrates:  NSR with T wave inversions inf lateral  Telemetry:  Telemetry was personally reviewed and demonstrates:  NSR     Past Medical History:  Diagnosis Date   Hypertension     Past Surgical History:  Procedure Laterality Date   ATRIAL FIBRILLATION ABLATION N/A 10/04/2022   Procedure: ATRIAL FIBRILLATION ABLATION;  Surgeon: [name], MD;  Location: [place];  Service: Cardiovascular;  Laterality: N/A;   BUBBLE STUDY  06/29/2022   Procedure: BUBBLE STUDY;  Surgeon: [name], MD;  Location: [facility];  Service: Cardiovascular;;   CARDIOVERSION N/A 12/28/2021   Procedure: CARDIOVERSION;  Surgeon: [name], MD;  Location: [place];  Service: Cardiovascular;  Laterality: N/A;   CIRCUMCISION     TEE WITHOUT CARDIOVERSION N/A 06/29/2022   Procedure: TRANSESOPHAGEAL ECHOCARDIOGRAM (TEE);  Surgeon: [name], MD;  Location: [facility];  Service: Cardiovascular;  Laterality: N/A;   TONSILLECTOMY AND ADENOIDECTOMY       Home Medications:  Prior to Admission medications   Medication Sig Start Date End Date Taking? Authorizing Provider  atorvastatin (LIPITOR) 40 MG tablet Take 40 mg by mouth daily. 08/30/21   [provider]  azelastine (ASTELIN) 0.1 % nasal spray Place 1 spray into both nostrils 2 (two) times daily. 09/28/22   [provider]  benzonatate (TESSALON) 200 MG capsule Take 200 mg by  mouth 3 (three) times daily as needed for cough. 09/28/22   [provider]  colchicine 0.6 MG tablet Take 1 tablet (0.6 mg total) by mouth 2 (two) times daily for 5 days. 10/04/22 10/09/22  Shirley Friar, PA-C  losartan-hydrochlorothiazide (HYZAAR) 100-12.5 MG tablet TAKE 1 TABLET BY MOUTH EVERY DAY FOR BLOOD PRESSURE 09/27/16   [provider]  MAG64 64 MG TBEC Take 2 tablets by mouth daily. 09/05/22   [provider]  metoprolol succinate (TOPROL-XL) 50 MG 24 hr tablet Take 1 tablet (50 mg total) by mouth 2 (two) times daily. Take with or immediately following a meal. 02/22/22   [name], MD  pantoprazole (PROTONIX) 40 MG tablet Take 1 tablet (40 mg total) by mouth daily. 10/04/22 11/18/22  Shirley Friar, PA-C  tamsulosin (FLOMAX) 0.4 MG CAPS capsule TAKE 1 CAPSULE BY MOUTH DAILY FOR PROSTATE 07/25/16   [provider]  XARELTO 20 MG TABS tablet TAKE 1 TABLET BY MOUTH EACH DAY WITH SUPPER AS A BLOOD THINNER (STOP ELIQUIS) 11/04/22   Fenton, Clint R, PA    Inpatient Medications: Scheduled Meds:  Continuous Infusions:  sodium chloride     PRN Meds:   Allergies:    Allergies  Allergen Reactions   Cheese Hives   Bee Venom Swelling   Chocolate Hives    In large quantities    Other Itching    Buttermilk    Wasp Venom Itching    Social History:   Social History   Socioeconomic History   Marital status: Widowed    Spouse name: Not on file   Number of children: Not on file   Years of education: Not on file   Highest education level: Not on file  Occupational History   Not on file  Tobacco Use   Smoking status: Former    Types: Cigarettes   Smokeless tobacco: Not on file   Tobacco comments:    Former smoker 01/11/22  Substance and Sexual Activity   Alcohol use: Not Currently   Drug use: Not Currently   Sexual activity: Not on file  Other Topics Concern   Not on file  Social History Narrative   Not on file    Social Determinants of Health   Financial Resource Strain: Not on file  Food Insecurity: Not on file  Transportation Needs: Not on file  Physical Activity: Not on file  Stress: Not on file  Social Connections: Not on file  Intimate Partner Violence: Not on file    Family History:    Family History  Problem Relation Age of Onset   COPD Brother      ROS:  Please see the history of present illness.  General:no colds or fevers, no weight changes Skin:no rashes or ulcers HEENT:no blurred vision, no congestion CV:see HPI PUL:see HPI GI:no diarrhea constipation or melena, no indigestion GU:no hematuria, no dysuria MS:no joint pain, no claudication Neuro:no syncope, no lightheadedness Endo:no diabetes, no thyroid disease  All other ROS reviewed and negative.     Physical Exam/Data:   Vitals:   11/07/22 1327 11/07/22 1337  11/07/22 1430 11/07/22 1615  BP:  110/72 96/73 102/81  Pulse:  78 74 98  Resp:  (!) '23 14 19  '$ SpO2: 95% 98% 100% 94%   No intake or output data in the 24 hours ending 11/07/22 1708    10/04/2022    5:46 AM 06/29/2022   11:59 AM 05/31/2022    9:10 AM  Last 3 Weights  Weight (lbs) 144 lb 144 lb 144 lb 6.4 oz  Weight (kg) 65.318 kg 65.318 kg 65.499 kg     There is no height or weight on file to calculate BMI.  General:  Well nourished, well developed, in no acute distress.  Does not appear pale HEENT: normal Neck: JVD to angle of jaw bilaterally, unchanged with breathing Vascular: No carotid bruits; Distal pulses 2+ bilaterally Cardiac:  normal S1, S2; RRR; no murmur  Lungs:  clear to auscultation bilaterally, no wheezing, rhonchi or rales  Abd: soft, nontender, no hepatomegaly  Ext: no edema Musculoskeletal:  No deformities, BUE and BLE strength normal and equal Skin: warm and dry  Neuro:  CNs 2-12 intact, no focal abnormalities noted Psych:  Normal affect   Relevant CV Studies:    Review of bedside TEE today shows evidence of tamponade  due to a very large pericardial effusion with fibrinous strands.  There is RV collapse, RA collapse, marked increase in respiratory variation across the mitral and tricuspid valve and a plethoric inferior vena cava.   12/07/21  TTE IMPRESSIONS     1. Left ventricular ejection fraction, by estimation, is 35 to 40%. The  left ventricle has moderately decreased function. The left ventricle  demonstrates global hypokinesis. Left ventricular diastolic parameters are  indeterminate.   2. Right ventricular systolic function is normal. The right ventricular  size is normal.   3. Left atrial size was mildly dilated.   4. The mitral valve is normal in structure. Trivial mitral valve  regurgitation. No evidence of mitral stenosis.   5. The aortic valve is normal in structure. Aortic valve regurgitation is  not visualized. No aortic stenosis is present.   6. The inferior vena cava is normal in size with greater than 50%  respiratory variability, suggesting right atrial pressure of 3 mmHg.       TEE 06/29/22 IMPRESSIONS     1. Left ventricular ejection fraction, by estimation, is 25 to 30%. The  left ventricle has severely decreased function.   2. Right ventricular systolic function is mildly reduced. The right  ventricular size is normal.   3. Left atrial size was moderately dilated. No left atrial/left atrial  appendage thrombus was detected.   4. Right atrial size was severely dilated.   5. The mitral valve is normal in structure. Trivial mitral valve  regurgitation.   6. The aortic valve is tricuspid. Aortic valve regurgitation is trivial.   7. There is mild (Grade II) plaque.   8. Agitated saline contrast bubble study was negative, with no evidence  of any interatrial shunt.   Cardiac CTA 05/24/22   The left atrial appendage is large chicken wing type with two lobes and ostial size 32 x 29 mm and length 40.1 mm. The appendage does not fill with contrast initially or after delay.  Consistent with thrombus.   The esophagus runs in the left atrial midline and is not in the proximity to any of the pulmonary veins.   Aorta:  Normal caliber.  3.1 cm.  No dissection or calcifications.   Aortic Valve:  Trileaflet.  No calcifications.   Coronary Arteries: Normal coronary origin. Right dominance. The study was performed without use of NTG and insufficient for plaque evaluation. Coronary calcium score 0.   IMPRESSION: 1. There is normal pulmonary vein drainage into the left atrium.   2. The left atrial appendage is large - mixed chicken wing / brocolli type with two lobes and ostial size 31 x 15 mm and length 33 mm. There is no thrombus in the left atrial appendage.   3. The esophagus runs in the left atrial midline and is not in the proximity to any of the pulmonary veins.   4. Coronary calcium score 0. Laboratory Data:  High Sensitivity Troponin:   Recent Labs  Lab 11/07/22 1414  TROPONINIHS 19*     Chemistry Recent Labs  Lab 11/07/22 1414 11/07/22 1509  NA 135 132*  K 4.5 4.4  CL 99  --   CO2 23  --   GLUCOSE 113*  --   BUN 57*  --   CREATININE 2.59*  --   CALCIUM 9.2  --   GFRNONAA 25*  --   ANIONGAP 13  --     Recent Labs  Lab 11/07/22 1414  PROT 6.1*  ALBUMIN 3.5  AST 46*  ALT 82*  ALKPHOS 133*  BILITOT 1.2   Lipids No results for input(s): "CHOL", "TRIG", "HDL", "LABVLDL", "LDLCALC", "CHOLHDL" in the last 168 hours.  Hematology Recent Labs  Lab 11/07/22 1414 11/07/22 1509  WBC 11.1*  --   RBC 3.45*  --   HGB 10.5* 9.9*  HCT 30.7* 29.0*  MCV 89.0  --   MCH 30.4  --   MCHC 34.2  --   RDW 14.4  --   PLT 148*  --    Thyroid No results for input(s): "TSH", "FREET4" in the last 168 hours.  BNP Recent Labs  Lab 11/07/22 1414  BNP 70.2    DDimer No results for input(s): "DDIMER" in the last 168 hours.   Radiology/Studies:  DG Chest 2 View  Result Date: 11/07/2022 CLINICAL DATA:  Shortness of breath EXAM: CHEST - 2  VIEW COMPARISON:  09/05/2022 FINDINGS: Significant enlargement of cardiac silhouette since previous exam, cannot exclude pericardial effusion with this cardiac configuration. Mediastinal contours and pulmonary vascularity normal. Subsegmental atelectasis in lingula and at LEFT base. No acute infiltrate, pleural effusion, or pneumothorax. Osseous structures unremarkable. IMPRESSION: Subsegmental atelectasis LEFT base. Significant enlargement of cardiac silhouette since 09/05/2022, cannot exclude pericardial effusion with this configuration; consider echocardiography assessment. Electronically Signed   By: Lavonia Dana M.D.   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  Although he is not hypotensive, his renal function shows marked deterioration, consistent with low cardiac output.  He has clinical evidence of severely elevated atrial filling pressures.  He will require pericardiocentesis.  On the other hand he appears to be well compensated hemodynamically at this time and is not requiring oxygen.  Would like to delay pericardial drain until tomorrow to allow some resolution of his anticoagulation effect (last dose of Eliquis was yesterday evening on 11/06/2022).  Pericardiocentesis scheduled tomorrow morning.  If there is hemodynamic deterioration overnight (frank hypotension/cardiogenic shock) would recommend urgent pericardiocentesis.  Holding Eliquis.  Start colchicine as anti-inflammatory.  Renal function is abnormal and if it does not improve quickly may switch from colchicine to systemic steroids.  Avoid additional diuretics until pericardiocentesis is performed.  Risk  Assessment/Risk Scores:       He has a history of previous ischemic optic neuropathy.  If this is considered to be an embolic event, his chads Vascor may be as high as 5.  CHA2DS2-VASc Score = 3   This indicates a 3.2% annual risk of stroke. The patient's score is based upon: CHF History: 1 HTN History: 1 Diabetes History: 0 Stroke History: 0 Vascular Disease History: 0 Age Score: 1 Gender Score: 0         For questions or updates, please contact [place] Please consult [email or web address] for contact info under    Sanda Klein, MD, [facility] [phone] office [phone] pager

## 2022-11-07 NOTE — Consult Note (Addendum)
Cardiology Consultation   Patient ID: FRANSICO SCIANDRA MRN: [id number]; DOB: Jan 15, 1946  Admit date: 11/07/2022 Date of Consult: 11/07/2022  PCP:  [name], [place] Providers Cardiologist:  None        Patient Profile:   Robert Stark is a 76 y.o. male with a hx of  HTN, chronic systolic CHF, atrial fib with atrial fib ablation/17/23  who is being seen 11/07/2022 for the evaluation of SOB with pericardial effusion on CXR at the request of Dr. Alvino Chapel.  History of Present Illness:   Mr. Rueth with hx as above and A fib initially diagnosed 10/2021, unaware of palpitations and presenting as dyspnea.  Placed on xarelto,but did not tolerate and switiched to Eliquis for CHA2DS2VASc of 3.his echocardiogram showed left ventricular hypokinesis with EF 35-40% (felt to be due to tachycardia) and original dilt changed to BB.  He was cardioverted but eventually went back into a fib.  Planned a fib ablation and in preparation Cardiac CTA revealing LAA thrombus, and ablation was cancelled.  Calcium score was 0.  06/29/22 TEE with EF 25-30%, RV mildly reduced LA mod dilated no thrombus in the left atrial appendage. RA severely dilated.  No significant valvular abnormalities or ASD/PFO.  Underwent uncomplicated ablation 60/73/71 (successful PVI, ablation/isolation of the posterior wall).  Intracardiac echo revealed trivial pericardial effusion left sided common PV ostium, large mobile eustachian valve. Stable at discharge.    He felt great for the first 2 weeks after ablation.  Over the ensuing 2 weeks he has had progressively worsening shortness of breath on exertion, no short of breath at rest with some orthopnea.  He's also developed dizziness with sudden changes in position although he has not had frank syncope.  He has developed poor appetite but no nausea or vomiting, fever or chills, cough or hemoptysis  Today pt presented to [facility] urgent care for SOB and weakness.   Sp02 82% on RA with 3 L [place] sp02 95%.    Echocardiogram shows a large circumferential pericardial effusion with fibrinous strands and all the typical echo findings of tamponade.  He has not been hypotensive in the emergency room.  Labs show marked worsening of renal function from a baseline creatinine of 1.13-1.35 up to 2.59 today.  Electrolytes are normal.  High-sensitivity troponin is normal as is the BNP.  WBC is borderline elevated and hemoglobin has decreased from 12.7-10.5.    EKG:  The EKG was personally reviewed and demonstrates:  NSR with T wave inversions inf lateral  Telemetry:  Telemetry was personally reviewed and demonstrates:  NSR     Past Medical History:  Diagnosis Date   Hypertension     Past Surgical History:  Procedure Laterality Date   ATRIAL FIBRILLATION ABLATION N/A 10/04/2022   Procedure: ATRIAL FIBRILLATION ABLATION;  Surgeon: [name], MD;  Location: [facility];  Service: Cardiovascular;  Laterality: N/A;   BUBBLE STUDY  06/29/2022   Procedure: BUBBLE STUDY;  Surgeon: [name], MD;  Location: [facility];  Service: Cardiovascular;;   CARDIOVERSION N/A 12/28/2021   Procedure: CARDIOVERSION;  Surgeon: [name], MD;  Location: [facility];  Service: Cardiovascular;  Laterality: N/A;   CIRCUMCISION     TEE WITHOUT CARDIOVERSION N/A 06/29/2022   Procedure: TRANSESOPHAGEAL ECHOCARDIOGRAM (TEE);  Surgeon: [name], MD;  Location: [facility];  Service: Cardiovascular;  Laterality: N/A;   TONSILLECTOMY AND ADENOIDECTOMY       Home Medications:  Prior to Admission medications   Medication Sig Start Date End Date Taking? Authorizing Provider  atorvastatin (LIPITOR) 40 MG tablet Take 40 mg by mouth daily. 08/30/21   [provider]  azelastine (ASTELIN) 0.1 % nasal spray Place 1 spray into both nostrils 2 (two) times daily. 09/28/22   [provider]  benzonatate (TESSALON) 200 MG capsule Take 200 mg by  mouth 3 (three) times daily as needed for cough. 09/28/22   [provider]  colchicine 0.6 MG tablet Take 1 tablet (0.6 mg total) by mouth 2 (two) times daily for 5 days. 10/04/22 10/09/22  Shirley Friar, PA-C  losartan-hydrochlorothiazide (HYZAAR) 100-12.5 MG tablet TAKE 1 TABLET BY MOUTH EVERY DAY FOR BLOOD PRESSURE 09/27/16   [provider]  MAG64 64 MG TBEC Take 2 tablets by mouth daily. 09/05/22   [provider]  metoprolol succinate (TOPROL-XL) 50 MG 24 hr tablet Take 1 tablet (50 mg total) by mouth 2 (two) times daily. Take with or immediately following a meal. 02/22/22   [name], MD  pantoprazole (PROTONIX) 40 MG tablet Take 1 tablet (40 mg total) by mouth daily. 10/04/22 11/18/22  Shirley Friar, PA-C  tamsulosin (FLOMAX) 0.4 MG CAPS capsule TAKE 1 CAPSULE BY MOUTH DAILY FOR PROSTATE 07/25/16   [provider]  XARELTO 20 MG TABS tablet TAKE 1 TABLET BY MOUTH EACH DAY WITH SUPPER AS A BLOOD THINNER (STOP ELIQUIS) 11/04/22   Fenton, Clint R, PA    Inpatient Medications: Scheduled Meds:  Continuous Infusions:  sodium chloride     PRN Meds:   Allergies:    Allergies  Allergen Reactions   Cheese Hives   Bee Venom Swelling   Chocolate Hives    In large quantities    Other Itching    Buttermilk    Wasp Venom Itching    Social History:   Social History   Socioeconomic History   Marital status: Widowed    Spouse name: Not on file   Number of children: Not on file   Years of education: Not on file   Highest education level: Not on file  Occupational History   Not on file  Tobacco Use   Smoking status: Former    Types: Cigarettes   Smokeless tobacco: Not on file   Tobacco comments:    Former smoker 01/11/22  Substance and Sexual Activity   Alcohol use: Not Currently   Drug use: Not Currently   Sexual activity: Not on file  Other Topics Concern   Not on file  Social History Narrative   Not on file    Social Determinants of Health   Financial Resource Strain: Not on file  Food Insecurity: Not on file  Transportation Needs: Not on file  Physical Activity: Not on file  Stress: Not on file  Social Connections: Not on file  Intimate Partner Violence: Not on file    Family History:    Family History  Problem Relation Age of Onset   COPD Brother      ROS:  Please see the history of present illness.  General:no colds or fevers, no weight changes Skin:no rashes or ulcers HEENT:no blurred vision, no congestion CV:see HPI PUL:see HPI GI:no diarrhea constipation or melena, no indigestion GU:no hematuria, no dysuria MS:no joint pain, no claudication Neuro:no syncope, no lightheadedness Endo:no diabetes, no thyroid disease  All other ROS reviewed and negative.     Physical Exam/Data:   Vitals:   11/07/22 1327 11/07/22 1337  11/07/22 1430 11/07/22 1615  BP:  110/72 96/73 102/81  Pulse:  78 74 98  Resp:  (!) '23 14 19  '$ SpO2: 95% 98% 100% 94%   No intake or output data in the 24 hours ending 11/07/22 1708    10/04/2022    5:46 AM 06/29/2022   11:59 AM 05/31/2022    9:10 AM  Last 3 Weights  Weight (lbs) 144 lb 144 lb 144 lb 6.4 oz  Weight (kg) 65.318 kg 65.318 kg 65.499 kg     There is no height or weight on file to calculate BMI.  General:  Well nourished, well developed, in no acute distress.  Does not appear pale HEENT: normal Neck: JVD to angle of jaw bilaterally, unchanged with breathing Vascular: No carotid bruits; Distal pulses 2+ bilaterally Cardiac:  normal S1, S2; RRR; no murmur  Lungs:  clear to auscultation bilaterally, no wheezing, rhonchi or rales  Abd: soft, nontender, no hepatomegaly  Ext: no edema Musculoskeletal:  No deformities, BUE and BLE strength normal and equal Skin: warm and dry  Neuro:  CNs 2-12 intact, no focal abnormalities noted Psych:  Normal affect   Relevant CV Studies:    Review of bedside TEE today shows evidence of tamponade  due to a very large pericardial effusion with fibrinous strands.  There is RV collapse, RA collapse, marked increase in respiratory variation across the mitral and tricuspid valve and a plethoric inferior vena cava.   12/07/21  TTE IMPRESSIONS     1. Left ventricular ejection fraction, by estimation, is 35 to 40%. The  left ventricle has moderately decreased function. The left ventricle  demonstrates global hypokinesis. Left ventricular diastolic parameters are  indeterminate.   2. Right ventricular systolic function is normal. The right ventricular  size is normal.   3. Left atrial size was mildly dilated.   4. The mitral valve is normal in structure. Trivial mitral valve  regurgitation. No evidence of mitral stenosis.   5. The aortic valve is normal in structure. Aortic valve regurgitation is  not visualized. No aortic stenosis is present.   6. The inferior vena cava is normal in size with greater than 50%  respiratory variability, suggesting right atrial pressure of 3 mmHg.       TEE 06/29/22 IMPRESSIONS     1. Left ventricular ejection fraction, by estimation, is 25 to 30%. The  left ventricle has severely decreased function.   2. Right ventricular systolic function is mildly reduced. The right  ventricular size is normal.   3. Left atrial size was moderately dilated. No left atrial/left atrial  appendage thrombus was detected.   4. Right atrial size was severely dilated.   5. The mitral valve is normal in structure. Trivial mitral valve  regurgitation.   6. The aortic valve is tricuspid. Aortic valve regurgitation is trivial.   7. There is mild (Grade II) plaque.   8. Agitated saline contrast bubble study was negative, with no evidence  of any interatrial shunt.   Cardiac CTA 05/24/22   The left atrial appendage is large chicken wing type with two lobes and ostial size 32 x 29 mm and length 40.1 mm. The appendage does not fill with contrast initially or after delay.  Consistent with thrombus.   The esophagus runs in the left atrial midline and is not in the proximity to any of the pulmonary veins.   Aorta:  Normal caliber.  3.1 cm.  No dissection or calcifications.   Aortic Valve:  Trileaflet.  No calcifications.   Coronary Arteries: Normal coronary origin. Right dominance. The study was performed without use of NTG and insufficient for plaque evaluation. Coronary calcium score 0.   IMPRESSION: 1. There is normal pulmonary vein drainage into the left atrium.   2. The left atrial appendage is large - mixed chicken wing / brocolli type with two lobes and ostial size 31 x 15 mm and length 33 mm. There is no thrombus in the left atrial appendage.   3. The esophagus runs in the left atrial midline and is not in the proximity to any of the pulmonary veins.   4. Coronary calcium score 0. Laboratory Data:  High Sensitivity Troponin:   Recent Labs  Lab 11/07/22 1414  TROPONINIHS 19*     Chemistry Recent Labs  Lab 11/07/22 1414 11/07/22 1509  NA 135 132*  K 4.5 4.4  CL 99  --   CO2 23  --   GLUCOSE 113*  --   BUN 57*  --   CREATININE 2.59*  --   CALCIUM 9.2  --   GFRNONAA 25*  --   ANIONGAP 13  --     Recent Labs  Lab 11/07/22 1414  PROT 6.1*  ALBUMIN 3.5  AST 46*  ALT 82*  ALKPHOS 133*  BILITOT 1.2   Lipids No results for input(s): "CHOL", "TRIG", "HDL", "LABVLDL", "LDLCALC", "CHOLHDL" in the last 168 hours.  Hematology Recent Labs  Lab 11/07/22 1414 11/07/22 1509  WBC 11.1*  --   RBC 3.45*  --   HGB 10.5* 9.9*  HCT 30.7* 29.0*  MCV 89.0  --   MCH 30.4  --   MCHC 34.2  --   RDW 14.4  --   PLT 148*  --    Thyroid No results for input(s): "TSH", "FREET4" in the last 168 hours.  BNP Recent Labs  Lab 11/07/22 1414  BNP 70.2    DDimer No results for input(s): "DDIMER" in the last 168 hours.   Radiology/Studies:  DG Chest 2 View  Result Date: 11/07/2022 CLINICAL DATA:  Shortness of breath EXAM: CHEST - 2  VIEW COMPARISON:  09/05/2022 FINDINGS: Significant enlargement of cardiac silhouette since previous exam, cannot exclude pericardial effusion with this cardiac configuration. Mediastinal contours and pulmonary vascularity normal. Subsegmental atelectasis in lingula and at LEFT base. No acute infiltrate, pleural effusion, or pneumothorax. Osseous structures unremarkable. IMPRESSION: Subsegmental atelectasis LEFT base. Significant enlargement of cardiac silhouette since 09/05/2022, cannot exclude pericardial effusion with this configuration; consider echocardiography assessment. Electronically Signed   By: Lavonia Dana M.D.   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  Although he is not hypotensive, his renal function shows marked deterioration, consistent with low cardiac output.  He has clinical evidence of severely elevated atrial filling pressures.  He will require pericardiocentesis.  On the other hand he appears to be well compensated hemodynamically at this time and is not requiring oxygen.  Would like to delay pericardial drain until tomorrow to allow some resolution of his anticoagulation effect (last dose of Eliquis was yesterday evening on 11/06/2022).  Pericardiocentesis scheduled tomorrow morning.  If there is hemodynamic deterioration overnight (frank hypotension/cardiogenic shock) would recommend urgent pericardiocentesis.  Holding Eliquis.  Start colchicine as anti-inflammatory.  Renal function is abnormal and if it does not improve quickly may switch from colchicine to systemic steroids.  Avoid additional diuretics until pericardiocentesis is performed.  Risk  Assessment/Risk Scores:       He has a history of previous ischemic optic neuropathy.  If this is considered to be an embolic event, his chads Vascor may be as high as 5.  CHA2DS2-VASc Score = 3   This indicates a 3.2% annual risk of stroke. The patient's score is based upon: CHF History: 1 HTN History: 1 Diabetes History: 0 Stroke History: 0 Vascular Disease History: 0 Age Score: 1 Gender Score: 0         For questions or updates, please contact [place] Please consult [email or web address] for contact info under    Sanda Klein, MD, [facility] [phone] office [phone] pager

## 2022-11-07 NOTE — ED Notes (Signed)
Respiratory at bedside drawing labs.

## 2022-11-07 NOTE — ED Provider Notes (Signed)
[facility] EMERGENCY DEPARTMENT Provider Note   CSN: [id number] Arrival date & time: 11/07/22  1326     History  Chief Complaint  Patient presents with   Weakness   Shortness of Breath    Robert Stark is a 76 y.o. male.    Past medical history significant for A-fib s/p ablation approximately 1 month ago, systolic heart failure, HTN, presenting with weakness and shortness of breath on exertion for the past 5 days.  He is accompanied by his granddaughter who assists in giving the history.  They deny fevers.  He endorses feeling generally weak and having decreased appetite.  He endorses nausea, denies vomiting.  Denies hemoptysis, hematemesis, hematochezia, melena, hematuria, dysuria.  He denies hitting his head or losing consciousness.  He denies chest pain.  Patient went to his primary care provider earlier today for this weakness and shortness of breath.  Per note from the [place] there, he had O2 saturation 88% on arrival there, per EMS patient was 82%.  He was transported here via EMS.        Home Medications Prior to Admission medications   Medication Sig Start Date End Date Taking? Authorizing Provider  atorvastatin (LIPITOR) 40 MG tablet Take 40 mg by mouth daily. 08/30/21   [provider]  azelastine (ASTELIN) 0.1 % nasal spray Place 1 spray into both nostrils 2 (two) times daily. 09/28/22   [provider]  benzonatate (TESSALON) 200 MG capsule Take 200 mg by mouth 3 (three) times daily as needed for cough. 09/28/22   [provider]  colchicine 0.6 MG tablet Take 1 tablet (0.6 mg total) by mouth 2 (two) times daily for 5 days. 10/04/22 10/09/22  Shirley Friar, PA-C  losartan-hydrochlorothiazide (HYZAAR) 100-12.5 MG tablet TAKE 1 TABLET BY MOUTH EVERY DAY FOR BLOOD PRESSURE 09/27/16   [provider]  MAG64 64 MG TBEC Take 2 tablets by mouth daily. 09/05/22   [provider]  metoprolol succinate  (TOPROL-XL) 50 MG 24 hr tablet Take 1 tablet (50 mg total) by mouth 2 (two) times daily. Take with or immediately following a meal. 02/22/22   [name], MD  pantoprazole (PROTONIX) 40 MG tablet Take 1 tablet (40 mg total) by mouth daily. 10/04/22 11/18/22  Shirley Friar, PA-C  tamsulosin (FLOMAX) 0.4 MG CAPS capsule TAKE 1 CAPSULE BY MOUTH DAILY FOR PROSTATE 07/25/16   [provider]  XARELTO 20 MG TABS tablet TAKE 1 TABLET BY MOUTH EACH DAY WITH SUPPER AS A BLOOD THINNER (STOP ELIQUIS) 11/04/22   Fenton, Clint R, PA      Allergies    Cheese, Bee venom, Chocolate, Other, and Wasp venom    Review of Systems   Review of Systems  Physical Exam Updated Vital Signs BP 102/81   Pulse 98   Resp 19   SpO2 94%  Physical Exam Constitutional:      Appearance: He is ill-appearing. He is not toxic-appearing or diaphoretic.  HENT:     Head: Normocephalic and atraumatic.     Mouth/Throat:     Mouth: Mucous membranes are moist.     Pharynx: Oropharynx is clear.  Eyes:     Extraocular Movements: Extraocular movements intact.     Pupils: Pupils are equal, round, and reactive to light.  Neck:     Vascular: No JVD.  Cardiovascular:     Rate and Rhythm: Normal rate and regular rhythm.  Pulmonary:     Effort: Pulmonary effort  is normal.     Breath sounds: Normal breath sounds. No decreased breath sounds.  Chest:     Chest wall: No tenderness.  Abdominal:     Palpations: Abdomen is soft.     Tenderness: There is no abdominal tenderness. There is no guarding or rebound.  Musculoskeletal:     Right lower leg: No tenderness. No edema.     Left lower leg: No tenderness. No edema.  Skin:    General: Skin is warm and dry.     Coloration: Skin is pale.  Neurological:     Mental Status: He is alert and oriented to person, place, and time.     Cranial Nerves: No cranial nerve deficit.     Motor: No weakness.     ED Results / Procedures / Treatments   Labs (all labs  ordered are listed, but only abnormal results are displayed) Labs Reviewed  COMPREHENSIVE METABOLIC PANEL - Abnormal; Notable for the following components:      Result Value   Glucose, Bld 113 (*)    BUN 57 (*)    Creatinine, Ser 2.59 (*)    Total Protein 6.1 (*)    AST 46 (*)    ALT 82 (*)    Alkaline Phosphatase 133 (*)    GFR, Estimated 25 (*)    All other components within normal limits  CBC WITH DIFFERENTIAL/PLATELET - Abnormal; Notable for the following components:   WBC 11.1 (*)    RBC 3.45 (*)    Hemoglobin 10.5 (*)    HCT 30.7 (*)    Platelets 148 (*)    Monocytes Absolute 4.1 (*)    Abs Immature Granulocytes 0.20 (*)    All other components within normal limits  I-STAT ARTERIAL BLOOD GAS, ED - Abnormal; Notable for the following components:   pH, Arterial 7.456 (*)    pCO2 arterial 28.6 (*)    TCO2 21 (*)    Acid-base deficit 3.0 (*)    Sodium 132 (*)    HCT 29.0 (*)    Hemoglobin 9.9 (*)    All other components within normal limits  TROPONIN I (HIGH SENSITIVITY) - Abnormal; Notable for the following components:   Troponin I (High Sensitivity) 19 (*)    All other components within normal limits  RESP PANEL BY RT-PCR (FLU A&B, COVID) ARPGX2  BRAIN NATRIURETIC PEPTIDE  PATHOLOGIST SMEAR REVIEW  TROPONIN I (HIGH SENSITIVITY)    EKG EKG Interpretation  Date/Time:  Monday November 07 2022 13:44:02 EST Ventricular Rate:  96 PR Interval:  154 QRS Duration: 88 QT Interval:  340 QTC Calculation: 429 R Axis:   69 Text Interpretation: Normal sinus rhythm T wave abnormality, consider inferior ischemia Abnormal ECG No previous ECGs available No significant change since last tracing Confirmed by Isla Pence [phone]) on 11/07/2022 3:00:27 PM  Radiology DG Chest 2 View  Result Date: 11/07/2022 CLINICAL DATA:  Shortness of breath EXAM: CHEST - 2 VIEW COMPARISON:  09/05/2022 FINDINGS: Significant enlargement of cardiac silhouette since previous exam, cannot exclude  pericardial effusion with this cardiac configuration. Mediastinal contours and pulmonary vascularity normal. Subsegmental atelectasis in lingula and at LEFT base. No acute infiltrate, pleural effusion, or pneumothorax. Osseous structures unremarkable. IMPRESSION: Subsegmental atelectasis LEFT base. Significant enlargement of cardiac silhouette since 09/05/2022, cannot exclude pericardial effusion with this configuration; consider echocardiography assessment. Electronically Signed   By: Lavonia Dana M.D.   On: 11/07/2022 15:06    Procedures Procedures    Medications Ordered in ED  Medications  sodium chloride 0.9 % bolus 500 mL (has no administration in time range)    ED Course/ Medical Decision Making/ A&P Clinical Course as of 11/07/22 1700  Mon Nov 07, 2022  1627 Potassium: 4.4 [AW]    Clinical Course User Index [AW] Luster Landsberg, MD                           Medical Decision Making Patient presents with weakness, dyspnea on exertion for 5 days.  Past medical history heart failure.  Concern for heart failure exacerbation.  Patient also endorses decreased oral intake.  Dehydration, hypoglycemia, metabolic derangement also on differential.  Patient does not have history of COPD, asthma.  No wheezing on lung exam.  Will evaluate for COVID/flu.  Amount and/or Complexity of Data Reviewed Labs: ordered. Decision-making details documented in ED Course. Radiology: ordered and independent interpretation performed. Decision-making details documented in ED Course. ECG/medicine tests: ordered and independent interpretation performed. Decision-making details documented in ED Course.  Risk Decision regarding hospitalization.   EKG: Sinus rhythm, ventricular rate 96 bpm, intervals unremarkable, QTc 429 ms-within normal limits.  T wave inversion in inferior leads and lateral leads.  ABG showed PaO2 87. CMP with AKI, creatinine 2.59 up from baseline 1.3, BUN elevated at 57.  LFTs slightly  elevated.  No anion gap.  CBC notable for hemoglobin 10.5, down from 12.7 one-month ago. No leukocytosis.    Initial troponin 19.  BNP pending.  I reviewed the patient's chest x-ray. Chest x-ray showed no pulmonary edema, no focal consolidation or opacification.  Cardiac silhouette significantly enlarged since prior.  I performed and reviewed patient's POCUS echo: Large pericardial effusion.    Formal ultrasound ordered.  Cardiology consulted.  Patient's blood pressure on my reevaluation was 025 systolic.  Not tachycardic.  O2 saturation 94%.  Stat echo ordered.  I discussed patient with cardiology, they will come to evaluate the patient.  Patient will require admission for management of his pericardial effusion and AKI.  Discussed the patient with the admitting team.         Final Clinical Impression(s) / ED Diagnoses Final diagnoses:  None    Rx / DC Orders ED Discharge Orders     None         Luster Landsberg, MD 11/07/22 1717    Isla Pence, MD 11/08/22 1201

## 2022-11-07 NOTE — ED Notes (Signed)
Patient transported to X-ray 

## 2022-11-07 NOTE — H&P (Addendum)
History and Physical    Robert Stark GYI:[id number] DOB: Jul 09, 1946 DOA: 11/07/2022  PCP: [name], DO (Confirm with patient/family/NH records and if not entered, this has to be entered at [facility] point of entry) Patient coming from: Home  I have personally briefly reviewed patient's old medical records in [place]  Chief Complaint: SOB  HPI: Robert Stark is a 76 y.o. 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  Symptoms started 5 days ago, with exertional dyspnea gradually getting worse, denies any cough no chest pains.  Denies any fever chills.  On his record, appears that patient was given 5 days worth of colchicine 0.6 mg twice daily by cardiology after ablation procedure, during which it was found patient has triple pericardial effusion.  Patient remember that point, he did have mild breathing symptoms.  Before last, patient had 1 week of URI-like symptoms and received symptomatic treatment with breathing medications along with 1 week of Medrol pack.  ED Course: Blood pressure borderline low, borderline tachycardia, x-ray showed large pericardial effusion.  Stat echo done by cardiology showed large pericardial effusion no tamponade.  Labs showed AKI creatinine 2.5 compared to baseline 1.3 several weeks ago, mild elevation of LFTs.  Review of Systems: As per HPI otherwise 14 point review of systems negative.   Past Medical History:  Diagnosis Date   Hypertension     Past Surgical History:  Procedure Laterality Date   ATRIAL FIBRILLATION ABLATION N/A 10/04/2022   Procedure: ATRIAL FIBRILLATION ABLATION;  Surgeon: [name], MD;  Location: [facility];  Service: Cardiovascular;  Laterality: N/A;   BUBBLE STUDY  06/29/2022   Procedure: BUBBLE STUDY;  Surgeon: [name], MD;  Location: [facility];  Service: Cardiovascular;;   CARDIOVERSION N/A 12/28/2021   Procedure: CARDIOVERSION;  Surgeon: [name], MD;  Location: [facility];  Service: Cardiovascular;  Laterality: N/A;   CIRCUMCISION     TEE WITHOUT CARDIOVERSION N/A 06/29/2022   Procedure: TRANSESOPHAGEAL ECHOCARDIOGRAM (TEE);  Surgeon: [name], MD;  Location: [facility];  Service: Cardiovascular;  Laterality: N/A;   TONSILLECTOMY AND ADENOIDECTOMY       reports that he has quit smoking. His smoking use included cigarettes. He does not have any smokeless tobacco history on file. He reports that he does not currently use alcohol. He reports that he does not currently use drugs.  Allergies  Allergen Reactions   Cheese Hives   Bee Venom Swelling   Chocolate Hives    In large quantities    Other Itching    Buttermilk    Wasp Venom Itching    Family History  Problem Relation Age of Onset   COPD Brother      Prior to Admission medications   Medication Sig Start Date End Date Taking? Authorizing Provider  atorvastatin (LIPITOR) 40 MG tablet Take 40 mg by mouth daily. 08/30/21   [provider]  azelastine (ASTELIN) 0.1 % nasal spray Place 1 spray into both nostrils 2 (two) times daily. 09/28/22   [provider]  benzonatate (TESSALON) 200 MG capsule Take 200 mg by mouth 3 (three) times daily as needed for cough. 09/28/22   [provider]  colchicine 0.6 MG tablet Take 1 tablet (0.6 mg total) by mouth 2 (two) times daily for 5 days. 10/04/22 10/09/22  Shirley Friar, PA-C  losartan-hydrochlorothiazide (  HYZAAR) 100-12.5 MG tablet TAKE 1 TABLET BY MOUTH EVERY DAY FOR BLOOD PRESSURE 09/27/16   [provider]  MAG64 64 MG TBEC Take 2 tablets by mouth daily. 09/05/22   [provider]  metoprolol succinate (TOPROL-XL) 50 MG 24 hr tablet Take 1 tablet (50 mg total) by mouth 2 (two) times daily. Take with or immediately following a meal. 02/22/22    [name], MD  pantoprazole (PROTONIX) 40 MG tablet Take 1 tablet (40 mg total) by mouth daily. 10/04/22 11/18/22  Shirley Friar, PA-C  tamsulosin (FLOMAX) 0.4 MG CAPS capsule TAKE 1 CAPSULE BY MOUTH DAILY FOR PROSTATE 07/25/16   [provider]  XARELTO 20 MG TABS tablet TAKE 1 TABLET BY MOUTH EACH DAY WITH SUPPER AS A BLOOD THINNER (STOP ELIQUIS) 11/04/22   Fenton, Allen R, [place]    Physical Exam: Vitals:   11/07/22 1337 11/07/22 1430 11/07/22 1615 11/07/22 1645  BP: 110/72 96/73 102/81 111/81  Pulse: 78 74 98 97  Resp: (!) '23 14 19 15  '$ SpO2: 98% 100% 94% 95%    Constitutional: NAD, calm, comfortable Vitals:   11/07/22 1337 11/07/22 1430 11/07/22 1615 11/07/22 1645  BP: 110/72 96/73 102/81 111/81  Pulse: 78 74 98 97  Resp: (!) '23 14 19 15  '$ SpO2: 98% 100% 94% 95%   Eyes: PERRL, lids and conjunctivae normal ENMT: Mucous membranes are moist. Posterior pharynx clear of any exudate or lesions.Normal dentition.  Neck: normal, supple, no masses, no thyromegaly Respiratory: clear to auscultation bilaterally, no wheezing, no crackles. Normal respiratory effort. No accessory muscle use.  Cardiovascular: Regular rate and rhythm, no murmurs / rubs / gallops. No extremity edema. 2+ pedal pulses. No carotid bruits.  Abdomen: no tenderness, no masses palpated. No hepatosplenomegaly. Bowel sounds positive.  Musculoskeletal: no clubbing / cyanosis. No joint deformity upper and lower extremities. Good ROM, no contractures. Normal muscle tone.  Skin: no rashes, lesions, ulcers. No induration Neurologic: CN 2-12 grossly intact. Sensation intact, DTR normal. Strength 5/5 in all 4.  Psychiatric: Normal judgment and insight. Alert and oriented x 3. Normal mood.     Labs on Admission: I have personally reviewed following labs and imaging studies  CBC: Recent Labs  Lab 11/07/22 1414 11/07/22 1509  WBC 11.1*  --   NEUTROABS 5.6  --   HGB 10.5* 9.9*  HCT 30.7* 29.0*  MCV  89.0  --   PLT 148*  --    Basic Metabolic Panel: Recent Labs  Lab 11/07/22 1414 11/07/22 1509  NA 135 132*  K 4.5 4.4  CL 99  --   CO2 23  --   GLUCOSE 113*  --   BUN 57*  --   CREATININE 2.59*  --   CALCIUM 9.2  --    GFR: CrCl cannot be calculated (Unknown ideal weight.). Liver Function Tests: Recent Labs  Lab 11/07/22 1414  AST 46*  ALT 82*  ALKPHOS 133*  BILITOT 1.2  PROT 6.1*  ALBUMIN 3.5   No results for input(s): "LIPASE", "AMYLASE" in the last 168 hours. No results for input(s): "AMMONIA" in the last 168 hours. Coagulation Profile: No results for input(s): "INR", "PROTIME" in the last 168 hours. Cardiac Enzymes: No results for input(s): "CKTOTAL", "CKMB", "CKMBINDEX", "TROPONINI" in the last 168 hours. BNP (last 3 results) No results for input(s): "PROBNP" in the last 8760 hours. HbA1C: No results for input(s): "HGBA1C" in the last 72 hours. CBG: No results for input(s): "GLUCAP" in the last  168 hours. Lipid Profile: No results for input(s): "CHOL", "HDL", "LDLCALC", "TRIG", "CHOLHDL", "LDLDIRECT" in the last 72 hours. Thyroid Function Tests: No results for input(s): "TSH", "T4TOTAL", "FREET4", "T3FREE", "THYROIDAB" in the last 72 hours. Anemia Panel: No results for input(s): "VITAMINB12", "FOLATE", "FERRITIN", "TIBC", "IRON", "RETICCTPCT" in the last 72 hours. Urine analysis:    Component Value Date/Time   COLORURINE YELLOW 10/28/2021 [place] 10/28/2021 1245   LABSPEC 1.015 10/28/2021 1245   PHURINE 5.5 10/28/2021 [place] 10/28/2021 1245   [place] 10/28/2021 1245   [place] 10/28/2021 [place] 10/28/2021 [place] 10/28/2021 1245   NITRITE NEGATIVE 10/28/2021 1245   LEUKOCYTESUR NEGATIVE 10/28/2021 1245    Radiological Exams on Admission: DG Chest 2 View  Result Date: 11/07/2022 CLINICAL DATA:  Shortness of breath EXAM: CHEST - 2 VIEW COMPARISON:   09/05/2022 FINDINGS: Significant enlargement of cardiac silhouette since previous exam, cannot exclude pericardial effusion with this cardiac configuration. Mediastinal contours and pulmonary vascularity normal. Subsegmental atelectasis in lingula and at LEFT base. No acute infiltrate, pleural effusion, or pneumothorax. Osseous structures unremarkable. IMPRESSION: Subsegmental atelectasis LEFT base. Significant enlargement of cardiac silhouette since 09/05/2022, cannot exclude pericardial effusion with this configuration; consider echocardiography assessment. Electronically Signed   By: Lavonia Dana M.D.   On: 11/07/2022 15:06    EKG: Independently reviewed.  Sinus, low voltage, multiple ST-T changes on chest and limb leads.  Assessment/Plan Active Problems:   * No active hospital problems. *  (please populate well all problems here in Problem List. (For example, if patient is on BP meds at home and you resume or decide to hold them, it is a problem that needs to be her. Same for CAD, COPD, HLD and so on)  Acute pericarditis -Etiology unknown, was first found to have trivial pericardial effusion on 10/04/2022 during A-fib ablation procedure by cardiology and completed 5 days course of colchicine. -Hemorrhagic pericardial effusion cannot be ruled out at this point as patient is on chronic blood thinner and chronic thrombocytopenia and appears that his hemoglobin lower compared to his baseline, decided to hold off blood thinner for now.  EKG showed patient is in sinus rhythm and risk for thromboembolic event is low this point. -Echocardiogram showed large pericardial effusion but no tamponade. -Treatment options limited as patient has concurrent AKI, will have to use renal dosed colchicine, NSAIDs contraindicated. -Cardiology plan for thoracentesis and drainage tomorrow, NPO after midnight. -Check coxsackie virus A/B  Acute on chronic HFrEF decompensation -Complicated by large pericardial effusion and  AKI for diuresis -Monitor volume status and kidney function closely.  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 -Check renal U/S  HTN -BP borderline low, likely from depressed cardiac output from large pericardial effusion -Hold off home BP meds, start as needed Lopressor  Acute transaminitis -Likely secondary to CHF decompensation, with CHF then trend LFTs.  PAF -In sinus rhythm, change metoprolol to as needed Lopressor -Hold off anticoagulation  Chronic normocytic anemia -H&H slight decrease compared to baseline, as above, hold off anticoagulation and rule out hemorrhagic pericardial effusion by pericardiocentesis tomorrow, outpatient iron study.  Thrombocytopenia -Idiopathic, chronic -Patient follow-up with oncology  DVT prophylaxis: SCD Code Status: Full code Family Communication: Granddaughter at bedside Disposition Plan: Patient sick with large pericardial effusion requiring  cardiology intervention, pericardial centesis and drainage, expect more than 2 midnight hospital stay. Consults called: Cardiology Admission status: Tele admit   Lequita Halt MD [facility] Pager [phone]  11/07/2022, 5:34 PM

## 2022-11-07 NOTE — ED Notes (Signed)
Consent is signed and at bedside.

## 2022-11-08 ENCOUNTER — Inpatient Hospital Stay (HOSPITAL_COMMUNITY)
Admission: EM | Disposition: A | Discharge status: Home / Self Care | Payer: Self-pay | Source: Home / Self Care | Attending: Cardiovascular Disease

## 2022-11-08 ENCOUNTER — Other Ambulatory Visit: Payer: Self-pay

## 2022-11-08 ENCOUNTER — Inpatient Hospital Stay (HOSPITAL_COMMUNITY): Payer: PPO

## 2022-11-08 ENCOUNTER — Encounter (HOSPITAL_COMMUNITY): Payer: Self-pay | Admitting: Interventional Cardiology

## 2022-11-08 ENCOUNTER — Inpatient Hospital Stay (HOSPITAL_COMMUNITY)
Admission: RE | Admit: 2022-11-08 | Discharge: 2022-11-08 | Disposition: A | Discharge status: Home / Self Care | Payer: PPO | Source: Ambulatory Visit | Attending: Physician Assistant | Admitting: Physician Assistant

## 2022-11-08 DIAGNOSIS — N289 Disorder of kidney and ureter, unspecified: Secondary | ICD-10-CM

## 2022-11-08 DIAGNOSIS — I4819 Other persistent atrial fibrillation: Secondary | ICD-10-CM | POA: Diagnosis not present

## 2022-11-08 DIAGNOSIS — I3139 Other pericardial effusion (noninflammatory): Secondary | ICD-10-CM

## 2022-11-08 HISTORY — PX: PERICARDIOCENTESIS: CATH118255

## 2022-11-08 LAB — PROTIME-INR
INR: 1.3 — ABNORMAL HIGH (ref 0.8–1.2)
Prothrombin Time: 16 seconds — ABNORMAL HIGH (ref 11.4–15.2)

## 2022-11-08 LAB — BASIC METABOLIC PANEL
Anion gap: 13 (ref 5–15)
BUN: 61 mg/dL — ABNORMAL HIGH (ref 8–23)
CO2: 21 mmol/L — ABNORMAL LOW (ref 22–32)
Calcium: 9.2 mg/dL (ref 8.9–10.3)
Chloride: 101 mmol/L (ref 98–111)
Creatinine, Ser: 2.08 mg/dL — ABNORMAL HIGH (ref 0.61–1.24)
GFR, Estimated: 33 mL/min — ABNORMAL LOW (ref >60)
Glucose, Bld: 172 mg/dL — ABNORMAL HIGH (ref 70–99)
Potassium: 4.1 mmol/L (ref 3.5–5.1)
Sodium: 135 mmol/L (ref 135–145)

## 2022-11-08 LAB — GRAM STAIN

## 2022-11-08 LAB — CBC
HCT: 31.4 % — ABNORMAL LOW (ref 39.0–52.0)
Hemoglobin: 10.6 g/dL — ABNORMAL LOW (ref 13.0–17.0)
MCH: 30.1 pg (ref 26.0–34.0)
MCHC: 33.8 g/dL (ref 30.0–36.0)
MCV: 89.2 fL (ref 80.0–100.0)
Platelets: 151 10*3/uL (ref 150–400)
RBC: 3.52 MIL/uL — ABNORMAL LOW (ref 4.22–5.81)
RDW: 14.6 % (ref 11.5–15.5)
WBC: 12.3 10*3/uL — ABNORMAL HIGH (ref 4.0–10.5)
nRBC: 0 % (ref 0.0–0.2)

## 2022-11-08 LAB — BODY FLUID CELL COUNT WITH DIFFERENTIAL
Eos, Fluid: 2 %
Lymphs, Fluid: 28 %
Monocyte-Macrophage-Serous Fluid: 7 % — ABNORMAL LOW (ref 50–90)
Neutrophil Count, Fluid: 63 % — ABNORMAL HIGH (ref 0–25)
Total Nucleated Cell Count, Fluid: 333 cu mm (ref 0–1000)

## 2022-11-08 LAB — HEPATIC FUNCTION PANEL
ALT: 85 U/L — ABNORMAL HIGH (ref 0–44)
AST: 57 U/L — ABNORMAL HIGH (ref 15–41)
Albumin: 3.2 g/dL — ABNORMAL LOW (ref 3.5–5.0)
Alkaline Phosphatase: 134 U/L — ABNORMAL HIGH (ref 38–126)
Bilirubin, Direct: 0.6 mg/dL — ABNORMAL HIGH (ref 0.0–0.2)
Indirect Bilirubin: 0.8 mg/dL (ref 0.3–0.9)
Total Bilirubin: 1.4 mg/dL — ABNORMAL HIGH (ref 0.3–1.2)
Total Protein: 5.9 g/dL — ABNORMAL LOW (ref 6.5–8.1)

## 2022-11-08 LAB — ECHOCARDIOGRAM LIMITED
Height: 65 in
Weight: 2240 oz

## 2022-11-08 LAB — MRSA NEXT GEN BY PCR, NASAL: MRSA by PCR Next Gen: NEGATIVE — AB

## 2022-11-08 SURGERY — PERICARDIOCENTESIS
Anesthesia: LOCAL

## 2022-11-08 MED ORDER — SODIUM CHLORIDE 0.9% FLUSH: 3.0000 mL | INTRAVENOUS | Status: DC | PRN

## 2022-11-08 MED ORDER — ASPIRIN 81 MG PO CHEW: 81.0000 mg | CHEWABLE_TABLET | ORAL | Status: DC

## 2022-11-08 MED ORDER — ACETAMINOPHEN 325 MG PO TABS: 650.0000 mg | ORAL_TABLET | ORAL | Status: DC | PRN

## 2022-11-08 MED ORDER — LABETALOL HCL 5 MG/ML IV SOLN: 10.0000 mg | INTRAVENOUS | Status: AC | PRN

## 2022-11-08 MED ORDER — ROPINIROLE HCL 0.25 MG PO TABS
0.2500 mg | ORAL_TABLET | Freq: Every day | ORAL | Status: AC
  Administered 2022-11-08 – 2022-11-09 (×2): 0.25 mg via ORAL
  Filled 2022-11-08 (×3): qty 1

## 2022-11-08 MED ORDER — HEPARIN SODIUM (PORCINE) 5000 UNIT/ML IJ SOLN
5000.0000 [IU] | Freq: Three times a day (TID) | INTRAMUSCULAR | Status: DC
  Administered 2022-11-08 – 2022-11-13 (×14): 5000 [IU] via SUBCUTANEOUS
  Filled 2022-11-08 (×14): qty 1

## 2022-11-08 MED ORDER — SODIUM CHLORIDE 0.9 % IV SOLN: 250.0000 mL | INTRAVENOUS | Status: DC | PRN

## 2022-11-08 MED ORDER — SODIUM CHLORIDE 0.9% FLUSH
3.0000 mL | Freq: Two times a day (BID) | INTRAVENOUS | Status: DC
  Administered 2022-11-08 – 2022-11-10 (×4): 3 mL via INTRAVENOUS

## 2022-11-08 MED ORDER — LIDOCAINE HCL (PF) 1 % IJ SOLN
INTRAMUSCULAR | Status: AC
  Filled 2022-11-08: qty 30

## 2022-11-08 MED ORDER — INFLUENZA VAC A&B SA ADJ QUAD 0.5 ML IM PRSY
0.5000 mL | PREFILLED_SYRINGE | INTRAMUSCULAR | Status: DC
  Filled 2022-11-08: qty 0.5

## 2022-11-08 MED ORDER — CHLORHEXIDINE GLUCONATE CLOTH 2 % EX PADS
6.0000 | MEDICATED_PAD | Freq: Every day | CUTANEOUS | Status: DC
  Administered 2022-11-08 – 2022-11-12 (×5): 6 via TOPICAL

## 2022-11-08 MED ORDER — ONDANSETRON HCL 4 MG/2ML IJ SOLN: 4.0000 mg | Freq: Four times a day (QID) | INTRAMUSCULAR | Status: DC | PRN

## 2022-11-08 MED ORDER — HEPARIN (PORCINE) IN NACL 1000-0.9 UT/500ML-% IV SOLN
INTRAVENOUS | Status: AC
  Filled 2022-11-08: qty 500

## 2022-11-08 MED ORDER — SODIUM CHLORIDE 0.9 % IV SOLN: INTRAVENOUS | Status: DC

## 2022-11-08 MED ORDER — HYDRALAZINE HCL 20 MG/ML IJ SOLN: 10.0000 mg | INTRAMUSCULAR | Status: AC | PRN

## 2022-11-08 MED ORDER — AMIODARONE HCL IN DEXTROSE 360-4.14 MG/200ML-% IV SOLN
60.0000 mg/h | INTRAVENOUS | Status: DC
  Administered 2022-11-08 (×2): 60 mg/h via INTRAVENOUS

## 2022-11-08 MED ORDER — LIDOCAINE HCL (PF) 1 % IJ SOLN
INTRAMUSCULAR | Status: DC | PRN
  Administered 2022-11-08: 5 mL

## 2022-11-08 MED ORDER — AMIODARONE LOAD VIA INFUSION
150.0000 mg | Freq: Once | INTRAVENOUS | Status: AC
  Administered 2022-11-08: 150 mg via INTRAVENOUS
  Filled 2022-11-08: qty 83.34

## 2022-11-08 MED ORDER — MIDAZOLAM HCL 2 MG/2ML IJ SOLN
INTRAMUSCULAR | Status: DC | PRN
  Administered 2022-11-08: 1 mg via INTRAVENOUS

## 2022-11-08 MED ORDER — MIDAZOLAM HCL 2 MG/2ML IJ SOLN
INTRAMUSCULAR | Status: AC
  Filled 2022-11-08: qty 2

## 2022-11-08 MED ORDER — AMIODARONE HCL IN DEXTROSE 360-4.14 MG/200ML-% IV SOLN
30.0000 mg/h | INTRAVENOUS | Status: DC
  Administered 2022-11-09: 30 mg/h via INTRAVENOUS
  Filled 2022-11-08 (×3): qty 200

## 2022-11-08 SURGICAL SUPPLY — 5 items
PACK CARDIAC CATHETERIZATION (CUSTOM PROCEDURE TRAY) IMPLANT
PROTECTION STATION PRESSURIZED (MISCELLANEOUS) ×1
STATION PROTECTION PRESSURIZED (MISCELLANEOUS) IMPLANT
TRANSDUCER W/STOPCOCK (MISCELLANEOUS) IMPLANT
TRAY PERICARDIOCENTESIS 6FX60 (TRAY / TRAY PROCEDURE) IMPLANT

## 2022-11-08 NOTE — Progress Notes (Signed)
This is a 72 gentleman who was admitted yesterday with cardiac tamponade, undergoing pericardiocentesis and then developed atrial fibrillation.  He has been in the Cath Lab all day long. due to patient having mainly cardiac issues and the complexity, discussed with cardiologist Dr. Sallyanne Kuster who has graciously agreed to accept the patient to cardiology service.  Hospitalist will sign off.

## 2022-11-08 NOTE — Progress Notes (Signed)
Rounding Note    Patient Name: Robert Stark Date of Encounter: 11/08/2022  [place] Cardiologist: Quentin Ore  Subjective   Seen immediately following pericardiocentesis, which yielded about 800 cc of bloody fluid.  He has minimal pleuritic pain.  The breathing is substantially improved.  The neck veins are no longer distended. Renal function actually already showing improving trend compared to yesterday.  Inpatient Medications    Scheduled Meds:  [START ON 11/09/2022] aspirin  81 mg Oral Pre-Cath   [MAR Hold] atorvastatin  40 mg Oral Daily   [MAR Hold] azelastine  1 spray Each Nare BID   [MAR Hold] B-complex with vitamin C  1 tablet Oral Daily   [MAR Hold] colchicine  0.3 mg Oral Daily   [MAR Hold] pantoprazole  40 mg Oral Daily   [MAR Hold] sodium chloride flush  3 mL Intravenous Q12H   [MAR Hold] sodium chloride flush  3 mL Intravenous Q12H   [MAR Hold] tamsulosin  0.4 mg Oral QPC supper   Continuous Infusions:  sodium chloride     sodium chloride Stopped (11/08/22 0026)   [MAR Hold] sodium chloride     sodium chloride     PRN Meds: sodium chloride, [MAR Hold] sodium chloride, sodium chloride, [MAR Hold] acetaminophen, [MAR Hold] benzonatate, lidocaine (PF), [MAR Hold] melatonin, [MAR Hold] metoprolol tartrate, midazolam, [MAR Hold] ondansetron (ZOFRAN) IV, sodium chloride flush, [MAR Hold] sodium chloride flush   Vital Signs    Vitals:   11/08/22 0846 11/08/22 0851 11/08/22 0856 11/08/22 0857  BP: (!) 108/54 (!) 108/54  (!) 110/51  Pulse: (!) 101 (!) 0 (!) 0 88  Resp: 16   12  Temp:      TempSrc:      SpO2: 97%     Weight:      Height:        Intake/Output Summary (Last 24 hours) at 11/08/2022 1035 Last data filed at 11/08/2022 0431 Gross per 24 hour  Intake --  Output 400 ml  Net -400 ml      11/08/2022    4:08 AM 10/04/2022    5:46 AM 06/29/2022   11:59 AM  Last 3 Weights  Weight (lbs) 140 lb 144 lb 144 lb  Weight (kg) 63.504 kg  65.318 kg 65.318 kg      Telemetry    Normal sinus rhythm- Personally Reviewed  ECG    Normal sinus rhythm, inferior T wave inversion- Personally Reviewed  Physical Exam  Appears comfortable.  Pericardiocentesis tube site healthy, drain contains bloody fluid. GEN: No acute distress.   Neck: No JVD Cardiac: RRR, no murmurs, rubs, or gallops.  Respiratory: Clear to auscultation bilaterally. GI: Soft, nontender, non-distended  MS: No edema; No deformity. Neuro:  Nonfocal  Psych: Normal affect   Labs    High Sensitivity Troponin:   Recent Labs  Lab 11/07/22 1414 11/07/22 1615  TROPONINIHS 19* 17     Chemistry Recent Labs  Lab 11/07/22 1414 11/07/22 1509 11/08/22 0413  NA 135 132* 135  K 4.5 4.4 4.1  CL 99  --  101  CO2 23  --  21*  GLUCOSE 113*  --  172*  BUN 57*  --  61*  CREATININE 2.59*  --  2.08*  CALCIUM 9.2  --  9.2  PROT 6.1*  --  5.9*  ALBUMIN 3.5  --  3.2*  AST 46*  --  57*  ALT 82*  --  85*  ALKPHOS 133*  --  134*  BILITOT 1.2  --  1.4*  GFRNONAA 25*  --  33*  ANIONGAP 13  --  13    Lipids No results for input(s): "CHOL", "TRIG", "HDL", "LABVLDL", "LDLCALC", "CHOLHDL" in the last 168 hours.  Hematology Recent Labs  Lab 11/07/22 1414 11/07/22 1509 11/08/22 0413  WBC 11.1*  --  12.3*  RBC 3.45*  --  3.52*  HGB 10.5* 9.9* 10.6*  HCT 30.7* 29.0* 31.4*  MCV 89.0  --  89.2  MCH 30.4  --  30.1  MCHC 34.2  --  33.8  RDW 14.4  --  14.6  PLT 148*  --  151   Thyroid No results for input(s): "TSH", "FREET4" in the last 168 hours.  BNP Recent Labs  Lab 11/07/22 1414  BNP 70.2    DDimer No results for input(s): "DDIMER" in the last 168 hours.   Radiology    ECHOCARDIOGRAM LIMITED  Result Date: 11/08/2022    ECHOCARDIOGRAM LIMITED REPORT   Patient Name:   Robert Stark Date of Exam: 11/08/2022 Medical Rec #:  [id number]        Height:       65.0 in Accession #:    [id number]       Weight:       140.0 lb Date of Birth:  1946-02-05         BSA:          1.700 m Patient Age:    76 years         BP:           100/82 mmHg Patient Gender: M                HR:           100 bpm. Exam Location:  Inpatient Procedure: Limited Echo Indications:    Pericardial effusion I31.3  History:        Patient has prior history of Echocardiogram examinations, most                 recent 11/07/2022. Risk Factors:Hypertension. Acute                 pericarditis. 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 Acute                 transaminitis. Chronic normocytic anemia.  Sonographer:    Darlina Sicilian RDCS Referring Phys: [place]  1. Large pericardial effusion. The pericardial effusion is surrounding the apex. There RV does not fully expand. From 8:10 AM to 8:32 AM (last image) improvedment in size of effusion. Still severe effusion at last image. Comparison(s): Prior images reviewed side by side. Effusion size has improved. FINDINGS  Pericardium: A large pericardial effusion is present. The pericardial effusion is surrounding the apex. There is diastolic collapse of the right ventricular free wall. Rudean Haskell MD Electronically signed by Rudean Haskell MD Signature Date/Time: 11/08/2022/9:01:44 AM    Final    CARDIAC CATHETERIZATION  Result Date: 11/08/2022   Successful pericardiocentesis from the subxiphoid approach with removal of 940 cc of bloody fluid.   Catheter sutured in place. Catheter sutured in place.  Catheter to be removed based on drainage.  Based on the echo, there does still appear to be some pericardial fluid, particularly at the apex.  The patient tolerated the procedure well. I called both emergency contacts that were listed in the chart but both numbers went to voicemail.   US RENAL  Result  Date: 11/07/2022 CLINICAL DATA:  Acute kidney injury. EXAM: RENAL / URINARY TRACT ULTRASOUND COMPLETE COMPARISON:  None Available. FINDINGS: Right Kidney: Renal measurements: 10.6 x 4.7 x 4.5 cm = volume: 117 mL.  Borderline increased parenchymal echogenicity. No hydronephrosis. No visualized stone or focal lesion. Left Kidney: Renal measurements: 9.7 x 4.4 x 4.1 cm = volume: 92 mL. Borderline increased parenchymal echogenicity. No hydronephrosis. No visualized stone or focal lesion. Bladder: The left ureteral jet is seen. Physiologic bladder distention without wall thickening. 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 Other: Trace ascites in the right upper quadrant. IMPRESSION: 1. No obstructive uropathy. 2. Borderline increased renal parenchymal echogenicity typical of chronic medical renal disease. 3. Right bladder diverticulum. Electronically Signed   By: Keith Rake M.D.   On: 11/07/2022 19:17   ECHOCARDIOGRAM COMPLETE  Result Date: 11/07/2022    ECHOCARDIOGRAM REPORT   Patient Name:   Robert Stark Date of Exam: 11/07/2022 Medical Rec #:  [id number]        Height:       66.0 in Accession #:    [id number]       Weight:       144.0 lb Date of Birth:  September 26, 1946        BSA:          1.739 m Patient Age:    29 years         BP:           111/81 mmHg Patient Gender: M                HR:           95 bpm. Exam Location:  Inpatient Procedure: 2D Echo, Cardiac Doppler and Color Doppler Indications:    Pericardial effusion I31.3  History:        Patient has prior history of Echocardiogram examinations, most                 recent 12/07/2022. Arrythmias:Atrial Fibrillation,                 Signs/Symptoms:Shortness of Breath; Risk Factors:Hypertension,                 Dyslipidemia and Former Smoker.  Sonographer:    Greer Pickerel Referring Phys: [place]  1. Left ventricular ejection fraction, by estimation, is 35 to 40%. The left ventricle has moderately decreased function. The left ventricle demonstrates global hypokinesis. Left ventricular diastolic parameters are indeterminate.  2. Right ventricular systolic function is normal. The right ventricular size is normal. Tricuspid regurgitation  signal is inadequate for assessing PA pressure.  3. Left atrial size was mildly dilated.  4. Large pericardial effusion. The pericardial effusion is circumferential. Findings are consistent with cardiac tamponade.  5. The mitral valve is normal in structure. Trivial mitral valve regurgitation. No evidence of mitral stenosis.  6. The aortic valve is normal in structure. Aortic valve regurgitation is not visualized. No aortic stenosis is present.  7. There is borderline dilatation of the aortic root, measuring 39 mm.  8. The inferior vena cava is dilated in size with <50% respiratory variability, suggesting right atrial pressure of 15 mmHg. Comparison(s): A prior study was performed on 12/07/2021. The left ventricular function is unchanged. The previous study showed a small pericardial effusion anterior to the right ventricle. The effusion is now large with findings of tamponade. FINDINGS  Left Ventricle: Left ventricular ejection fraction, by estimation, is 35 to 40%. The left ventricle  has moderately decreased function. The left ventricle demonstrates global hypokinesis. The left ventricular internal cavity size was normal in size. There is no left ventricular hypertrophy. Left ventricular diastolic parameters are indeterminate. Right Ventricle: The right ventricular size is normal. No increase in right ventricular wall thickness. Right ventricular systolic function is normal. Tricuspid regurgitation signal is inadequate for assessing PA pressure. Left Atrium: Left atrial size was mildly dilated. Right Atrium: Right atrial size was normal in size. Pericardium: A large pericardial effusion is present. The pericardial effusion is circumferential. There is diastolic collapse of the right ventricular free wall, diastolic collapse of the right atrial wall, excessive respiratory variation in the mitral valve spectral Doppler velocities and excessive respiratory variation in the tricuspid valve spectral Doppler velocities.  There is evidence of cardiac tamponade. Mitral Valve: The mitral valve is normal in structure. Trivial mitral valve regurgitation. No evidence of mitral valve stenosis. Tricuspid Valve: The tricuspid valve is normal in structure. Tricuspid valve regurgitation is not demonstrated. Aortic Valve: The aortic valve is normal in structure. Aortic valve regurgitation is not visualized. No aortic stenosis is present. Pulmonic Valve: The pulmonic valve was grossly normal. Pulmonic valve regurgitation is not visualized. Aorta: There is borderline dilatation of the aortic root, measuring 39 mm. Venous: The inferior vena cava is dilated in size with less than 50% respiratory variability, suggesting right atrial pressure of 15 mmHg. IAS/Shunts: No atrial level shunt detected by color flow Doppler.  LEFT VENTRICLE PLAX 2D LVOT diam:     2.20 cm     Diastology LV SV:         51          LV e' medial:    6.42 cm/s LV SV Index:   29          LV E/e' medial:  8.4 LVOT Area:     3.80 cm    LV e' lateral:   4.79 cm/s                            LV E/e' lateral: 11.3  LV Volumes (MOD) LV vol d, MOD A2C: 68.0 ml LV vol d, MOD A4C: 67.3 ml LV vol s, MOD A2C: 40.0 ml LV vol s, MOD A4C: 43.7 ml LV SV MOD A2C:     28.0 ml LV SV MOD A4C:     67.3 ml LV SV MOD BP:      24.6 ml RIGHT VENTRICLE TAPSE (M-mode): 0.6 cm LEFT ATRIUM             Index        RIGHT ATRIUM          Index LA Vol (A2C):   38.6 ml 22.19 ml/m  RA Area:     9.72 cm LA Vol (A4C):   65.7 ml 37.78 ml/m  RA Volume:   20.40 ml 11.73 ml/m LA Biplane Vol: 51.7 ml 29.73 ml/m  AORTIC VALVE LVOT Vmax:   92.60 cm/s LVOT Vmean:  66.400 cm/s LVOT VTI:    0.133 m  AORTA Ao Root diam: 3.90 cm Ao Asc diam:  3.30 cm MITRAL VALVE MV Area (PHT): 5.31 cm    SHUNTS MV Decel Time: 143 msec    Systemic VTI:  0.13 m MV E velocity: 54.15 cm/s  Systemic Diam: 2.20 cm MV A velocity: 56.20 cm/s MV E/A ratio:  0.96 Janet Decesare MD Electronically signed by Sanda Klein MD Signature Date/Time:  11/07/2022/6:50:02  PM    Final    DG Chest 2 View  Result Date: 11/07/2022 CLINICAL DATA:  Shortness of breath EXAM: CHEST - 2 VIEW COMPARISON:  09/05/2022 FINDINGS: Significant enlargement of cardiac silhouette since previous exam, cannot exclude pericardial effusion with this cardiac configuration. Mediastinal contours and pulmonary vascularity normal. Subsegmental atelectasis in lingula and at LEFT base. No acute infiltrate, pleural effusion, or pneumothorax. Osseous structures unremarkable. IMPRESSION: Subsegmental atelectasis LEFT base. Significant enlargement of cardiac silhouette since 09/05/2022, cannot exclude pericardial effusion with this configuration; consider echocardiography assessment. Electronically Signed   By: Lavonia Dana M.D.   On: 11/07/2022 15:06    Cardiac Studies   Pericardiocentesis with periprocedural echo    1. Large pericardial effusion. The pericardial effusion is surrounding  the apex. There RV does not fully expand. From 8:10 AM to 8:32 AM (last  image) improvedment in size of effusion. Still severe effusion at last  image.   Comparison(s): Prior images reviewed side by side. Effusion size has  improved.       Successful pericardiocentesis from the subxiphoid approach with removal of 940 cc of bloody fluid.   Catheter sutured in place.   Catheter sutured in place.  Catheter to be removed based on drainage.  Based on the echo, there does still appear to be some pericardial fluid, particularly at the apex.  The patient tolerated the procedure well.   I called both emergency contacts that were listed in the chart but both numbers went to voicemail.   Patient Profile     76 y.o. male presenting with pericardial tamponade roughly 1 month after radiofrequency ablation procedure for atrial fibrillation, underwent pericardiocentesis with removal of almost 1 L of bloody fluid on 11/08/2022.  Assessment & Plan    We will monitor amount of drainage.  DC  pericardiocentesis catheter once follow-up echo shows empty pericardial sac and drainage less than roughly 50-100 mL / 24 hours. We did echo in a.m.  For questions or updates, please contact [place] Please consult [email or web address] for contact info under        Signed, Sanda Klein, MD  11/08/2022, 10:35 AM

## 2022-11-08 NOTE — Consult Note (Signed)
Cardiology Consultation   Patient ID: Robert Stark MRN: [id number]; DOB: 07-18-1946  Admit date: 11/07/2022 Date of Consult: 11/08/2022  PCP:  [name], DO   [facility] Cardiologist:  Dr. Sallyanne Kuster EP: Dr. Quentin Ore   Patient Profile:   Robert Stark is a 76 y.o. 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 Sallyanne Kuster.  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  He was referred to AFib clinic and eventually EP for rhythm control options.  Ultimately underwent AFib ablation 10/04/22, discharged same day.  He did quite well for the initial couple weeks though began to feel progressively SOB, some orthostatic dizziness developed and went to the ER yesterday, sats 82% on RA, TTE noted large pericardial effusion  with fibrinous strands and all the typical echo findings of tamponade, though no hypotension, was in SR. Renal function significantly worsened from baseline. With stable BP, his a/c held and planned to pursue pericardial drain the next day  NOTED that at the time of his ablation ICE exam showed stable LV function and no change in the baseline pericardial effusion.   He had pericardiocentesis this morning with drain placement, removing 940cc of bloody fluid. Post procedure developed rapid Afib and started on amiodarone gtt  EP made aware of his admission  He is seen on post procedural holding area, feeling well, minimal if any CP, no active SOB  LABS K+ 4.5 > > 4.1 BUN/Creat 57/2.59 > 2.08 (baseline probably about 1.3) AST 46 > 57 ALT 82 > 85 HS Trop 19, 17 BNP 70.2 WBC 11.1 > 12.3 H/H 10.6/31 Plts 151     Past Medical History:  Diagnosis Date   Hypertension     Past Surgical  History:  Procedure Laterality Date   ATRIAL FIBRILLATION ABLATION N/A 10/04/2022   Procedure: ATRIAL FIBRILLATION ABLATION;  Surgeon: LVickie Epley MD;  Location[facility];  Service: Cardiovascular;  Laterality: N/A;   BUBBLE STUDY  06/29/2022   Procedure: BUBBLE STUDY;  Surgeon: PFreada Bergeron MD;  [facility]: [facility]  Service: Cardiovascular;;   CARDIOVERSION N/A 12/28/2021   Procedure: CARDIOVERSION;  Surgeon: RFay Records MD;  [facility]  Service: Cardiovascular;  Laterality: N/A;   CIRCUMCISION     TEE WITHOUT CARDIOVERSION N/A 06/29/2022   Procedure: TRANSESOPHAGEAL ECHOCARDIOGRAM (TEE);  Surgeon: PFreada Bergeron MD;  [facility];  Service: Cardiovascular;  Laterality: N/A;   TONSILLECTOMY AND ADENOIDECTOMY       Home Medications:  Prior to Admission medications   Medication Sig Start Date End Date Taking? Authorizing Provider  atorvastatin (LIPITOR) 40 MG tablet Take 40 mg by mouth daily. 08/30/21  Yes [provider]  azelastine (ASTELIN) 0.1 % nasal spray Place 1 spray into both nostrils 2 (two) times daily. 09/28/22  Yes [provider]  azelastine (ASTELIN) 0.1 % nasal spray Place into the nose. 09/28/22  Yes [provider]  losartan-hydrochlorothiazide (HYZAAR) 100-12.5 MG tablet Take 1 tablet by mouth daily. For blood pressure 09/27/16  Yes [provider]  metoprolol succinate (TOPROL-XL) 50 MG 24 hr tablet Take 1 tablet (50 mg total) by mouth 2 (two) times daily. Take with or immediately following a meal. 02/22/22  Yes LVickie Epley  MD  pantoprazole (PROTONIX) 40 MG tablet Take 1 tablet (40 mg total) by mouth daily. 10/04/22 11/18/22 Yes Tillery, Satira Mccallum, PA-C  tadalafil (CIALIS) 5 MG tablet Take 5 mg by mouth daily as needed for erectile dysfunction. 07/19/22  Yes [provider]  tamsulosin (FLOMAX) 0.4 MG CAPS capsule Take 0.4 mg by mouth daily. For prostate 07/25/16   Yes [provider]  XARELTO 20 MG TABS tablet TAKE 1 TABLET BY MOUTH EACH DAY WITH SUPPER AS A BLOOD THINNER (STOP ELIQUIS) Patient taking differently: Take 20 mg by mouth daily with supper. As a blood thinner (Stop Eliquis) 11/04/22  Yes Fenton, Brooks R, PA    Inpatient Medications: Scheduled Meds:  [START ON 11/09/2022] aspirin  81 mg Oral Pre-Cath   [MAR Hold] atorvastatin  40 mg Oral Daily   [MAR Hold] azelastine  1 spray Each Nare BID   [MAR Hold] B-complex with vitamin C  1 tablet Oral Daily   [MAR Hold] colchicine  0.3 mg Oral Daily   [MAR Hold] pantoprazole  40 mg Oral Daily   [MAR Hold] sodium chloride flush  3 mL Intravenous Q12H   [MAR Hold] sodium chloride flush  3 mL Intravenous Q12H   [MAR Hold] tamsulosin  0.4 mg Oral QPC supper   Continuous Infusions:  sodium chloride     sodium chloride Stopped (11/08/22 0026)   [MAR Hold] sodium chloride     sodium chloride     amiodarone 60 mg/hr (11/08/22 1217)   Followed by   amiodarone     PRN Meds: sodium chloride, [MAR Hold] sodium chloride, sodium chloride, [MAR Hold] acetaminophen, [MAR Hold] benzonatate, lidocaine (PF), [MAR Hold] melatonin, [MAR Hold] metoprolol tartrate, midazolam, [MAR Hold] ondansetron (ZOFRAN) IV, sodium chloride flush, [MAR Hold] sodium chloride flush  Allergies:    Allergies  Allergen Reactions   Cheese Hives   Bee Venom Swelling    Venom -- Honey Bee   Chocolate Hives    In large quantities    Cocoa Hives    In large quantities   Other Itching    Buttermilk    Wasp Venom Itching    Social History:   Social History   Socioeconomic History   Marital status: Widowed    Spouse name: Not on file   Number of children: Not on file   Years of education: Not on file   Highest education level: Not on file  Occupational History   Not on file  Tobacco Use   Smoking status: Former    Types: Cigarettes   Smokeless tobacco: Not on file   Tobacco comments:    Former smoker  01/11/22  Substance and Sexual Activity   Alcohol use: Not Currently   Drug use: Not Currently   Sexual activity: Not on file  Other Topics Concern   Not on file  Social History Narrative   Not on file   Social Determinants of Health   Financial Resource Strain: Not on file  Food Insecurity: Not on file  Transportation Needs: Not on file  Physical Activity: Not on file  Stress: Not on file  Social Connections: Not on file  Intimate Partner Violence: Not on file    Family History:   Family History  Problem Relation Age of Onset   COPD Brother      ROS:  Please see the history of present illness.  All other ROS reviewed and negative.     Physical Exam/Data:   Vitals:   11/08/22 4696  11/08/22 0851 11/08/22 0856 11/08/22 0857  BP: (!) 108/54 (!) 108/54  (!) 110/51  Pulse: (!) 101 (!) 0 (!) 0 88  Resp: 16   12  Temp:      TempSrc:      SpO2: 97%     Weight:      Height:        Intake/Output Summary (Last 24 hours) at 11/08/2022 1452 Last data filed at 11/08/2022 0431 Gross per 24 hour  Intake --  Output 400 ml  Net -400 ml      11/08/2022    4:08 AM 10/04/2022    5:46 AM 06/29/2022   11:59 AM  Last 3 Weights  Weight (lbs) 140 lb 144 lb 144 lb  Weight (kg) 63.504 kg 65.318 kg 65.318 kg     Body mass index is 23.3 kg/m.  General:  Well nourished, well developed, in no acute distress HEENT: normal Neck: no JVD Vascular: No carotid bruits; Distal pulses 2+ bilaterally Cardiac:  irreg-irreg, tachycardic; no murmurs, no rub appreciated Pericardial drain in place Lungs:  CTA b/l, no wheezing, rhonchi or rales  Abd: soft, nontender Ext: no edema Musculoskeletal:  No deformities Skin: warm and dry  Neuro:  no gross focal motor abnormalities noted Psych:  Normal affect   EKG:  The EKG was personally reviewed and demonstrates:   SR, 96bpm, no ischemic looking changes  Telemetry:  Telemetry was personally reviewed and demonstrates:   AFib 110's-120's  currently  Relevant CV Studies:  11/08/22: limited echo 1. Large pericardial effusion. The pericardial effusion is surrounding  the apex. There RV does not fully expand. From 8:10 AM to 8:32 AM (last  image) improvedment in size of effusion. Still severe effusion at last  image.  Comparison(s): Prior images reviewed side by side. Effusion size has  improved.   FINDINGS   Pericardium: A large pericardial effusion is present. The pericardial  effusion is surrounding the apex. There is diastolic collapse of the right  ventricular free wall.   11/07/22: TTE 1. Left ventricular ejection fraction, by estimation, is 35 to 40%. The  left ventricle has moderately decreased function. The left ventricle  demonstrates global hypokinesis. Left ventricular diastolic parameters are  indeterminate.   2. Right ventricular systolic function is normal. The right ventricular  size is normal. Tricuspid regurgitation signal is inadequate for assessing  PA pressure.   3. Left atrial size was mildly dilated.   4. Large pericardial effusion. The pericardial effusion is  circumferential. Findings are consistent with cardiac tamponade.   5. The mitral valve is normal in structure. Trivial mitral valve  regurgitation. No evidence of mitral stenosis.   6. The aortic valve is normal in structure. Aortic valve regurgitation is  not visualized. No aortic stenosis is present.   7. There is borderline dilatation of the aortic root, measuring 39 mm.   8. The inferior vena cava is dilated in size with <50% respiratory  variability, suggesting right atrial pressure of 15 mmHg.   Comparison(s): A prior study was performed on 12/07/2021. The left  ventricular function is unchanged. The previous study showed a small  pericardial effusion anterior to the right ventricle. The effusion is now  large with findings of tamponade.   10/04/22 EPS/ablation CONCLUSIONS: 1. Successful PVI 2. Successful ablation/isolation  of the posterior wall 3. Intracardiac echo reveals trivial pericardial effusion, left sided common PV ostium, large mobile eustachian valve. 4. No early apparent complications. 5. Protonix '40mg'$  PO daily x 45 days  6. Colchicine 0.'6mg'$  PO BID x 5 days  (NOTE: Due to the large mobile eustachian valve,  elected not to ablate the CTI).   06/29/22: TEE 1. Left ventricular ejection fraction, by estimation, is 25 to 30%. The  left ventricle has severely decreased function.   2. Right ventricular systolic function is mildly reduced. The right  ventricular size is normal.   3. Left atrial size was moderately dilated. No left atrial/left atrial  appendage thrombus was detected.   4. Right atrial size was severely dilated.   5. The mitral valve is normal in structure. Trivial mitral valve  regurgitation.   6. The aortic valve is tricuspid. Aortic valve regurgitation is trivial.   7. There is mild (Grade II) plaque.   8. Agitated saline contrast bubble study was negative, with no evidence  of any interatrial shunt.    Laboratory Data:  High Sensitivity Troponin:   Recent Labs  Lab 11/07/22 1414 11/07/22 1615  TROPONINIHS 19* 17     Chemistry Recent Labs  Lab 11/07/22 1414 11/07/22 1509 11/08/22 0413  NA 135 132* 135  K 4.5 4.4 4.1  CL 99  --  101  CO2 23  --  21*  GLUCOSE 113*  --  172*  BUN 57*  --  61*  CREATININE 2.59*  --  2.08*  CALCIUM 9.2  --  9.2  GFRNONAA 25*  --  33*  ANIONGAP 13  --  13    Recent Labs  Lab 11/07/22 1414 11/08/22 0413  PROT 6.1* 5.9*  ALBUMIN 3.5 3.2*  AST 46* 57*  ALT 82* 85*  ALKPHOS 133* 134*  BILITOT 1.2 1.4*   Lipids No results for input(s): "CHOL", "TRIG", "HDL", "LABVLDL", "LDLCALC", "CHOLHDL" in the last 168 hours.  Hematology Recent Labs  Lab 11/07/22 1414 11/07/22 1509 11/08/22 0413  WBC 11.1*  --  12.3*  RBC 3.45*  --  3.52*  HGB 10.5* 9.9* 10.6*  HCT 30.7* 29.0* 31.4*  MCV 89.0  --  89.2  MCH 30.4  --  30.1  MCHC 34.2   --  33.8  RDW 14.4  --  14.6  PLT 148*  --  151   Thyroid No results for input(s): "TSH", "FREET4" in the last 168 hours.  BNP Recent Labs  Lab 11/07/22 1414  BNP 70.2    DDimer No results for input(s): "DDIMER" in the last 168 hours.   Radiology/Studies:    US RENAL Result Date: 11/07/2022 CLINICAL DATA:  Acute kidney injury. EXAM: RENAL / URINARY TRACT ULTRASOUND COMPLETE COMPARISON:  None Available. FINDINGS: Right Kidney: Renal measurements: 10.6 x 4.7 x 4.5 cm = volume: 117 mL. Borderline increased parenchymal echogenicity. No hydronephrosis. No visualized stone or focal lesion. Left Kidney: Renal measurements: 9.7 x 4.4 x 4.1 cm = volume: 92 mL. Borderline increased parenchymal echogenicity. No hydronephrosis. No visualized stone or focal lesion. Bladder: The left ureteral jet is seen. Physiologic bladder distention without wall thickening. 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 Other: Trace ascites in the right upper quadrant. IMPRESSION: 1. No obstructive uropathy. 2. Borderline increased renal parenchymal echogenicity typical of chronic medical renal disease. 3. Right bladder diverticulum. Electronically Signed   By: Keith Rake M.D.   On: 11/07/2022 19:17   DG Chest 2 View Result Date: 11/07/2022 CLINICAL DATA:  Shortness of breath EXAM: CHEST - 2 VIEW COMPARISON:  09/05/2022 FINDINGS: Significant enlargement of cardiac silhouette since previous exam, cannot exclude pericardial effusion with this cardiac configuration. Mediastinal contours and  pulmonary vascularity normal. Subsegmental atelectasis in lingula and at LEFT base. No acute infiltrate, pleural effusion, or pneumothorax. Osseous structures unremarkable. IMPRESSION: Subsegmental atelectasis LEFT base. Significant enlargement of cardiac silhouette since 09/05/2022, cannot exclude pericardial effusion with this configuration; consider echocardiography assessment. Electronically Signed   By: Lavonia Dana M.D.   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 Croitoru/cards team managing  Persistent AFib CHA2DS2Vasc is 3, on Eliquis out pt Held 2/2 above Agree with amiodarone gtt, hopefully he has quick return of sinus  LFTs elevated 2/2 tamponade, follow Amiodarone will be a short term AAD Resume [place] when safe to do so  3. AKI Follow, once better consider course of colchicine/NASID  Dr. Quentin Ore has seen/examined the patient We will follow along  Risk Assessment/Risk Scores:     For questions or updates, please contact [place] Please consult [email or web address] for contact info under    Signed, Baldwin Jamaica, PA-C  11/08/2022 2:52 PM

## 2022-11-08 NOTE — ED Notes (Signed)
Pt transported to cath lab with cath team for procedure. All belongings taken with pt included wallet.

## 2022-11-08 NOTE — Interval H&P Note (Signed)
History and Physical Interval Note:  11/08/2022 7:56 AM  Robert Stark  has presented today for surgery, with the diagnosis of pericardiocentesis.  The various methods of treatment have been discussed with the patient and family. After consideration of risks, benefits and other options for treatment, the patient has consented to  Procedure(s): PERICARDIOCENTESIS (N/A) as a surgical intervention.  The patient's history has been reviewed, patient examined, no change in status, stable for surgery.  I have reviewed the patient's chart and labs.  Questions were answered to the patient's satisfaction.     Larae Grooms

## 2022-11-08 NOTE — Progress Notes (Signed)
Abruptly went into atrial fibrillation w RVR at 1011h. Asymptomatic. Relatively low DBP. Recent ablation. Unable to anticoagulate. Will start iv Amiodarone.

## 2022-11-08 NOTE — Progress Notes (Signed)
Heart Failure Navigator Progress Note  Assessed for Heart & Vascular TOC clinic readiness.  Patient Ef 35-40% Chronic HFrEF Here for pericardial effusion.   Navigator will sign off at this time.   Earnestine Leys, BSN, Clinical cytogeneticist Only

## 2022-11-08 NOTE — ED Notes (Signed)
Patient ambulated to the side of the bed to use urinal. Patient became very short of breath. Patient is now back in bed, breathing is steady and unlabored. Patient continues on 3L [place].

## 2022-11-09 ENCOUNTER — Encounter (HOSPITAL_COMMUNITY): Payer: Self-pay | Admitting: Interventional Cardiology

## 2022-11-09 ENCOUNTER — Inpatient Hospital Stay (HOSPITAL_COMMUNITY): Payer: PPO

## 2022-11-09 DIAGNOSIS — I3139 Other pericardial effusion (noninflammatory): Secondary | ICD-10-CM

## 2022-11-09 DIAGNOSIS — I314 Cardiac tamponade: Secondary | ICD-10-CM | POA: Diagnosis not present

## 2022-11-09 DIAGNOSIS — I312 Hemopericardium, not elsewhere classified: Secondary | ICD-10-CM | POA: Diagnosis not present

## 2022-11-09 DIAGNOSIS — I480 Paroxysmal atrial fibrillation: Secondary | ICD-10-CM | POA: Diagnosis not present

## 2022-11-09 DIAGNOSIS — I4819 Other persistent atrial fibrillation: Secondary | ICD-10-CM | POA: Diagnosis not present

## 2022-11-09 LAB — CBC WITH DIFFERENTIAL/PLATELET
Abs Immature Granulocytes: 0.27 10*3/uL — ABNORMAL HIGH (ref 0.00–0.07)
Basophils Absolute: 0 10*3/uL (ref 0.0–0.1)
Basophils Relative: 0 %
Eosinophils Absolute: 0.1 10*3/uL (ref 0.0–0.5)
Eosinophils Relative: 1 %
HCT: 28.8 % — ABNORMAL LOW (ref 39.0–52.0)
Hemoglobin: 10.2 g/dL — ABNORMAL LOW (ref 13.0–17.0)
Immature Granulocytes: 3 %
Lymphocytes Relative: 9 %
Lymphs Abs: 1 10*3/uL (ref 0.7–4.0)
MCH: 30.4 pg (ref 26.0–34.0)
MCHC: 35.4 g/dL (ref 30.0–36.0)
MCV: 85.7 fL (ref 80.0–100.0)
Monocytes Absolute: 4.2 10*3/uL — ABNORMAL HIGH (ref 0.1–1.0)
Monocytes Relative: 38 %
Neutro Abs: 5.4 10*3/uL (ref 1.7–7.7)
Neutrophils Relative %: 49 %
Platelets: 152 10*3/uL (ref 150–400)
RBC: 3.36 MIL/uL — ABNORMAL LOW (ref 4.22–5.81)
RDW: 14.1 % (ref 11.5–15.5)
WBC: 10.9 10*3/uL — ABNORMAL HIGH (ref 4.0–10.5)
nRBC: 0 % (ref 0.0–0.2)

## 2022-11-09 LAB — GLUCOSE, BODY FLUID OTHER: Glucose, Body Fluid Other: 103 mg/dL

## 2022-11-09 LAB — BASIC METABOLIC PANEL
Anion gap: 9 (ref 5–15)
BUN: 37 mg/dL — ABNORMAL HIGH (ref 8–23)
CO2: 25 mmol/L (ref 22–32)
Calcium: 8.4 mg/dL — ABNORMAL LOW (ref 8.9–10.3)
Chloride: 100 mmol/L (ref 98–111)
Creatinine, Ser: 1.21 mg/dL (ref 0.61–1.24)
GFR, Estimated: >60 mL/min (ref >60)
Glucose, Bld: 121 mg/dL — ABNORMAL HIGH (ref 70–99)
Potassium: 3.8 mmol/L (ref 3.5–5.1)
Sodium: 134 mmol/L — ABNORMAL LOW (ref 135–145)

## 2022-11-09 LAB — ECHOCARDIOGRAM LIMITED
Height: 65 in
S' Lateral: 3.5 cm
Weight: 2349.22 oz

## 2022-11-09 LAB — PROTEIN, BODY FLUID (OTHER): Total Protein, Body Fluid Other: 5.1 g/dL

## 2022-11-09 LAB — LD, BODY FLUID (OTHER): LD, Body Fluid: 541 IU/L

## 2022-11-09 LAB — PATHOLOGIST SMEAR REVIEW

## 2022-11-09 MED ORDER — POTASSIUM CHLORIDE 20 MEQ PO PACK
40.0000 meq | PACK | Freq: Once | ORAL | Status: AC
  Administered 2022-11-09: 40 meq via ORAL
  Filled 2022-11-09: qty 2

## 2022-11-09 MED ORDER — OXYCODONE-ACETAMINOPHEN 5-325 MG PO TABS
1.0000 | ORAL_TABLET | ORAL | Status: DC | PRN
  Administered 2022-11-09: 1 via ORAL
  Administered 2022-11-11: 2 via ORAL
  Filled 2022-11-09: qty 1
  Filled 2022-11-09: qty 2
  Filled 2022-11-09: qty 1

## 2022-11-09 MED ORDER — AMIODARONE HCL 200 MG PO TABS
400.0000 mg | ORAL_TABLET | Freq: Every day | ORAL | Status: DC
  Administered 2022-11-09 – 2022-11-10 (×2): 400 mg via ORAL
  Filled 2022-11-09 (×2): qty 2

## 2022-11-09 MED FILL — Heparin Sod (Porcine)-NaCl IV Soln 1000 Unit/500ML-0.9%: INTRAVENOUS | Qty: 500 | Status: AC

## 2022-11-09 NOTE — Progress Notes (Signed)
  Echocardiogram 2D Echocardiogram has been performed.  Robert Stark 11/09/2022, 9:50 AM

## 2022-11-09 NOTE — Consult Note (Cosign Needed)
[address]       [place] [address]             [id number]        Hendrixx D Blitch Bernice Medical Record #[id number] Date of Birth: January 03, 1946  Referring: Croitoru Primary Care: [name], DO Primary Cardiologist:None  Chief Complaint:    Chief Complaint  Patient presents with   Weakness   Shortness of Breath    History of Present Illness:      Robert Stark is a 76 yo male with history of HTN,chronic systolic CHG, Atrial Fibrillation S/P Ablation in October of 2023.  He presented to the ED on 11/20 with complaints of shortness of breath and weakness.  This was associated with dizziness.  He denied syncope.  His oxygen saturations were noted to be 82% on RA.  EKG obtained showed NSR.  Echocardiogram was obtained and showed a large pleural effusion with fibrinous strand with evidence of tamponade.  Labs obtained showed an elevated creatinine level up to 2.59.  Cardiology felt Pericardiocentesis would be indicated.  The patient was admitted for further care.  He underwent Pericardiocentesis on 11/08/2022.  This removed 940 cc of bloody fluid and a catheter was left in place.  He tolerated the procedure without difficulty and was taken to the SICU in stable condition.  Repeat Echocardiogram was obtained on 11/22 and showed evidence of a large pericardial effusion.  Unfortunately no further drainage from his catheter was possible.  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  Cardiothoracic surgery consultation is requested for a pericardial window.  Current Activity/ Functional Status: Patient is independent with mobility/ambulation, transfers, ADL's, IADL's.   Zubrod Score: At the time of surgery this patient's most appropriate activity status/level should be described as: '[]'$     0    Normal activity, no symptoms '[x]'$     1    Restricted in physical strenuous activity but ambulatory, able to do out light work '[]'$     2    Ambulatory and capable of self  care, unable to do work activities, up and about                 more than 50%  Of the time                            '[]'$     3    Only limited self care, in bed greater than 50% of waking hours '[]'$     4    Completely disabled, no self care, confined to bed or chair '[]'$     5    Moribund  Past Medical History:  Diagnosis Date   Hypertension     Past Surgical History:  Procedure Laterality Date   ATRIAL FIBRILLATION ABLATION N/A 10/04/2022   Procedure: ATRIAL FIBRILLATION ABLATION;  Surgeon: [name], MD;  Location: [facility];  Service: Cardiovascular;  Laterality: N/A;   BUBBLE STUDY  06/29/2022   Procedure: BUBBLE STUDY;  Surgeon: [name], MD;  Location: [facility];  Service: Cardiovascular;;   CARDIOVERSION N/A 12/28/2021   Procedure: CARDIOVERSION;  Surgeon: [name], MD;  Location: [facility] ENDOSCOPY;  Service: Cardiovascular;  Laterality: N/A;   CIRCUMCISION     PERICARDIOCENTESIS N/A 11/08/2022   Procedure: PERICARDIOCENTESIS;  Surgeon: [name], MD;  Location: [place] [facility];  Service: Cardiovascular;  Laterality: N/A;   TEE WITHOUT  CARDIOVERSION N/A 06/29/2022   Procedure: TRANSESOPHAGEAL ECHOCARDIOGRAM (TEE);  Surgeon: [name], MD;  Location: [facility];  Service: Cardiovascular;  Laterality: N/A;   TONSILLECTOMY AND ADENOIDECTOMY      Social History   Tobacco Use  Smoking Status Former   Types: Cigarettes   Quit date: 1983   Years since quitting: 40.9  Smokeless Tobacco Not on file  Tobacco Comments   Former smoker 01/11/22    Social History   Substance and Sexual Activity  Alcohol Use Not Currently     Allergies  Allergen Reactions   Cheese Hives   Bee Venom Swelling    Venom -- Honey Bee   Chocolate Hives    In large quantities    Cocoa Hives    In large quantities   Other Itching    Buttermilk    Wasp Venom Itching    Current Facility-Administered Medications  Medication Dose Route  Frequency Provider Last Rate Last Admin   0.9 %  sodium chloride infusion  250 mL Intravenous PRN [name], MD 10 mL/hr at 11/09/22 1132 250 mL at 11/09/22 1132   0.9 %  sodium chloride infusion  250 mL Intravenous PRN [name], MD       acetaminophen (TYLENOL) tablet 650 mg  650 mg Oral Q4H PRN [name], MD   650 mg at 11/09/22 0031   acetaminophen (TYLENOL) tablet 650 mg  650 mg Oral Q4H PRN [name], MD       amiodarone (PACERONE) tablet 400 mg  400 mg Oral Daily Croitoru, Mihai, MD   400 mg at 11/09/22 1129   atorvastatin (LIPITOR) tablet 40 mg  40 mg Oral Daily [name], MD   40 mg at 11/09/22 1004   azelastine (ASTELIN) 0.1 % nasal spray 1 spray  1 spray Each Nare BID [name], MD   1 spray at 11/09/22 1008   B-complex with vitamin C tablet 1 tablet  1 tablet Oral Daily [name], MD   1 tablet at 11/09/22 1003   benzonatate (TESSALON) capsule 200 mg  200 mg Oral TID PRN [name], MD   200 mg at 11/08/22 0409   Chlorhexidine Gluconate Cloth 2 % PADS 6 each  6 each Topical Daily Croitoru, Mihai, MD   6 each at 11/09/22 1008   colchicine tablet 0.3 mg  0.3 mg Oral Daily [name], MD   0.3 mg at 11/09/22 1004   heparin injection 5,000 Units  5,000 Units Subcutaneous Q8H [name], MD   5,000 Units at 11/09/22 7858   influenza vaccine adjuvanted (FLUAD) injection 0.5 mL  0.5 mL Intramuscular Tomorrow-1000 Croitoru, Mihai, MD       melatonin tablet 3 mg  3 mg Oral QHS PRN [name], MD   3 mg at 11/09/22 0031   metoprolol tartrate (LOPRESSOR) injection 2.5 mg  2.5 mg Intravenous Q6H PRN [name], MD       ondansetron [facility]) injection 4 mg  4 mg Intravenous Q6H PRN [name], MD       ondansetron [facility]) injection 4 mg  4 mg Intravenous Q6H PRN [name], MD       pantoprazole (PROTONIX) EC tablet 40 mg  40 mg Oral Daily [name], MD   40 mg at 11/09/22 1003   rOPINIRole (REQUIP) tablet 0.25 mg  0.25 mg Oral QHS Laurice Record, MD   0.25  mg at 11/08/22 2359   sodium chloride flush (NS) 0.9 % injection 3 mL  3 mL Intravenous Q12H [name], MD   3 mL at 11/07/22 2159   sodium chloride flush (NS) 0.9 % injection 3 mL  3 mL Intravenous Q12H [name], MD   3 mL at 11/07/22 2207   sodium chloride flush (NS) 0.9 % injection 3 mL  3 mL Intravenous PRN [name], MD       sodium chloride flush (NS) 0.9 % injection 3 mL  3 mL Intravenous Q12H Larae Grooms S, MD   3 mL at 11/09/22 1006   sodium chloride flush (NS) 0.9 % injection 3 mL  3 mL Intravenous PRN [name], MD       tamsulosin [facility]) capsule 0.4 mg  0.4 mg Oral QPC supper Larae Grooms S, MD   0.4 mg at 11/08/22 1859    Medications Prior to Admission  Medication Sig Dispense Refill Last Dose   atorvastatin (LIPITOR) 40 MG tablet Take 40 mg by mouth daily.   11/06/2022   azelastine (ASTELIN) 0.1 % nasal spray Place 1 spray into both nostrils 2 (two) times daily.   unk   azelastine (ASTELIN) 0.1 % nasal spray Place into the nose.   unk   losartan-hydrochlorothiazide (HYZAAR) 100-12.5 MG tablet Take 1 tablet by mouth daily. For blood pressure   11/06/2022 at 1000   metoprolol succinate (TOPROL-XL) 50 MG 24 hr tablet Take 1 tablet (50 mg total) by mouth 2 (two) times daily. Take with or immediately following a meal. 180 tablet 3 11/06/2022 at 1800   pantoprazole (PROTONIX) 40 MG tablet Take 1 tablet (40 mg total) by mouth daily. 45 tablet 0 11/06/2022   tadalafil (CIALIS) 5 MG tablet Take 5 mg by mouth daily as needed for erectile dysfunction.   unk   tamsulosin (FLOMAX) 0.4 MG CAPS capsule Take 0.4 mg by mouth daily. For prostate   11/06/2022   XARELTO 20 MG TABS tablet TAKE 1 TABLET BY MOUTH EACH DAY WITH SUPPER AS A BLOOD THINNER (STOP ELIQUIS) (Patient taking differently: Take 20 mg by mouth daily with supper.  As a blood thinner (Stop Eliquis)) 30 tablet 3 11/06/2022 at 1800    Family History  Problem Relation Age of Onset   Heart attack Mother    Heart attack Father      Review of Systems:   ROS    Cardiac Review of Systems: Y or  [    ]= no  Chest Pain [ N   ]  Resting SOB [  Y ] Exertional SOB  [ Y ]  Orthopnea [  ]   Pedal Edema [   ]    Palpitations Aqua.Slicker  ] Syncope  Aqua.Slicker  ]   Presyncope [   ]  General Review of Systems: [Y] = yes [  ]=no Constitional: recent weight change [  ]; anorexia [  ]; fatigue [Y, weakness  ]; nausea [ y ]; night sweats [  ]; fever [  ]; or chills [  ]                                                               Dental: Last Dentist visit:  Eye : blurred vision [  ]; diplopia [   ]; vision changes [  ];  Amaurosis fugax[  ]; Resp: cough [  ];  wheezing[  ];  hemoptysis[  ]; shortness of breath[ Y ]; paroxysmal nocturnal dyspnea[  ]; dyspnea on exertion[  ]; or orthopnea[  ];  GI:  gallstones[  ], vomiting[ N ];  dysphagia[  ]; melena[  ];  hematochezia [  ]; heartburn[  ];   Hx of  Colonoscopy[  ]; GU: kidney stones [  ]; hematuria[  ];   dysuria [  ];  nocturia[  ];  history of     obstruction [  ]; urinary frequency [  ]             Skin: rash, swelling[  ];, hair loss[  ];  peripheral edema[  ];  or itching[  ]; Musculosketetal: myalgias[  ];  joint swelling[  ];  joint erythema[  ];  joint pain[  ];  back pain[  ];  Heme/Lymph: bruising[  ];  bleeding[  ];  anemia[  ];  Neuro: TIA[  ];  headaches[  ];  stroke[  ];  vertigo[  ];  seizures[  ];   paresthesias[  ];  difficulty walking[  ];  Psych:depression[  ]; anxiety[  ];  Endocrine: diabetes[ N ];  thyroid dysfunction[ N ];    Physical Exam: BP (!) 123/108   Pulse 75   Temp 98.2 F (36.8 C) (Oral)   Resp 18   Ht '5\' 5"'$  (1.651 m)   Wt 66.6 kg   SpO2 99%   BMI 24.43 kg/m   General appearance: alert, cooperative, and no distress Head: Normocephalic, without obvious abnormality, atraumatic Resp:  clear to auscultation bilaterally Cardio: regular rate and rhythm GI: soft, non-tender; bowel sounds normal; no masses,  no organomegaly Extremities: extremities normal, atraumatic, no cyanosis or edema Neurologic: Grossly normal  Diagnostic Studies & Laboratory data:     Recent Radiology Findings:   ECHOCARDIOGRAM LIMITED  Result Date: 11/09/2022    ECHOCARDIOGRAM LIMITED REPORT   Patient Name:   MOUHAMADOU GITTLEMAN Date of Exam: 11/09/2022 Medical Rec #:  [id number]        Height:       65.0 in Accession #:    [id number]       Weight:       146.8 lb Date of Birth:  May 19, 1946        BSA:          1.735 m Patient Age:    82 years         BP:           115/86 mmHg Patient Gender: M                HR:           71 bpm. Exam Location:  Inpatient Procedure: Limited Echo, 2D Echo, Color Doppler and Limited Color Doppler Indications:    Pericardial effusion I31.3  History:        Patient has prior history of Echocardiogram examinations, most                 recent 11/08/2022. Pericarditis, Pericardial Disease,                 Arrythmias:Atrial Fibrillation, Signs/Symptoms:Shortness of                 Breath; Risk Factors:Hypertension and Dyslipidemia.  Sonographer:  Greer Pickerel Referring Phys: [id number] [facility] : [facility] CROIT[facility]U  Sonographer Comments: Image acquisition challenging due to respiratory motion. IMPRESSIONS  1. Left ventricular ejection fraction, by estimation, is 40 to 45%. The left ventricle has mildly decreased function. The left ventricle has no regional wall motion abnormalities.  2. Right ventricular systolic function is mildly reduced. The right ventricular size is normal.  3. No RA or RV collapse. There is no respirophasic of mitral valve inflow velocities. 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 Large pericardial effusion. The pericardial effusion is surrounding the apex and circumferential. There is no evidence of cardiac tamponade.  4. The mitral valve is normal in structure. No evidence of mitral valve regurgitation.  No evidence of mitral stenosis.  5. The aortic valve is tricuspid. Aortic valve regurgitation is not visualized. No aortic stenosis is present.  6. The inferior vena cava is dilated in size with <50% respiratory variability, suggesting right atrial pressure of 15 mmHg. Comparison(s): Prior images reviewed side by side. LV is less vigorous than prior. FINDINGS  Left Ventricle: Left ventricular ejection fraction, by estimation, is 40 to 45%. The left ventricle has mildly decreased function. The left ventricle has no regional wall motion abnormalities. The left ventricular internal cavity size was normal in size. There is no left ventricular hypertrophy. Right Ventricle: The right ventricular size is normal. No increase in right ventricular wall thickness. Right ventricular systolic function is mildly reduced. Left Atrium: Left atrial size was normal in size. Right Atrium: Right atrial size was normal in size. Pericardium: No RA or RV collapse. There is no respirophasic of mitral valve inflow velocities. 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 A large pericardial effusion is present. The pericardial effusion is surrounding the apex and circumferential. There is no evidence of cardiac tamponade. Mitral Valve: The mitral valve is normal in structure. No evidence of mitral valve stenosis. Tricuspid Valve: The tricuspid valve is normal in structure. Tricuspid valve regurgitation is not demonstrated. No evidence of tricuspid stenosis. Aortic Valve: The aortic valve is tricuspid. Aortic valve regurgitation is not visualized. No aortic stenosis is present. Pulmonic Valve: The pulmonic valve was normal in structure. Pulmonic valve regurgitation is not visualized. No evidence of pulmonic stenosis. Aorta: The aortic root is normal in size and structure. Venous: The inferior vena cava is dilated in size with less than 50% respiratory variability, suggesting right atrial pressure of 15 mmHg. IAS/Shunts: No atrial level shunt detected by color flow  Doppler. LEFT VENTRICLE PLAX 2D LVIDd:         4.40 cm LVIDs:         3.50 cm LV PW:         0.70 cm LV IVS:        0.70 cm  RIGHT VENTRICLE RV S prime:     8.70 cm/s TAPSE (M-mode): 1.0 cm LEFT ATRIUM         Index LA diam:    2.70 cm 1.56 cm/m Rudean Haskell MD Electronically signed by Rudean Haskell MD Signature Date/Time: 11/09/2022/10:55:29 AM    Final    DG CHEST PORT 1 VIEW  Result Date: 11/09/2022 CLINICAL DATA:  Pericardial effusion, weakness, shortness of breath, post pericardiocentesis on 11/08/2022 EXAM: PORTABLE CHEST 1 VIEW COMPARISON:  Portable exam 0703 hours compared to 11/07/2022 FINDINGS: Enlargement of cardiac silhouette unchanged since 11/07/2022. Mediastinal contours and pulmonary vascularity normal. LEFT pleural effusion and basilar atelectasis. Upper lungs clear. No infiltrate or pneumothorax. IMPRESSION: Persistent enlargement of cardiac silhouette despite interval pericardiocentesis, question recurrent pericardial effusion. Electronically Signed  By: Lavonia Dana M.D.   On: 11/09/2022 08:43   ECHOCARDIOGRAM LIMITED  Result Date: 11/08/2022    ECHOCARDIOGRAM LIMITED REPORT   Patient Name:   Robert Stark Date of Exam: 11/08/2022 Medical Rec #:  [id number]        Height:       65.0 in Accession #:    [id number]       Weight:       140.0 lb Date of Birth:  May 24, 1946        BSA:          1.700 m Patient Age:    21 years         BP:           100/82 mmHg Patient Gender: M                HR:           100 bpm. Exam Location:  Inpatient Procedure: Limited Echo Indications:    Pericardial effusion I31.3  History:        Patient has prior history of Echocardiogram examinations, most                 recent 11/07/2022. Risk Factors:Hypertension. Acute                 pericarditis. 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 Acute                 transaminitis. Chronic normocytic anemia.  Sonographer:    Darlina Sicilian RDCS Referring Phys: [place]  1.  Large pericardial effusion. The pericardial effusion is surrounding the apex. There RV does not fully expand. From 8:10 AM to 8:32 AM (last image) improvedment in size of effusion. Still severe effusion at last image. Comparison(s): Prior images reviewed side by side. Effusion size has improved. FINDINGS  Pericardium: A large pericardial effusion is present. The pericardial effusion is surrounding the apex. There is diastolic collapse of the right ventricular free wall. Rudean Haskell MD Electronically signed by Rudean Haskell MD Signature Date/Time: 11/08/2022/9:01:44 AM    Final    CARDIAC CATHETERIZATION  Result Date: 11/08/2022   Successful pericardiocentesis from the subxiphoid approach with removal of 940 cc of bloody fluid.   Catheter sutured in place. Catheter sutured in place.  Catheter to be removed based on drainage.  Based on the echo, there does still appear to be some pericardial fluid, particularly at the apex.  The patient tolerated the procedure well. I called both emergency contacts that were listed in the chart but both numbers went to voicemail.   US RENAL  Result Date: 11/07/2022 CLINICAL DATA:  Acute kidney injury. EXAM: RENAL / URINARY TRACT ULTRASOUND COMPLETE COMPARISON:  None Available. FINDINGS: Right Kidney: Renal measurements: 10.6 x 4.7 x 4.5 cm = volume: 117 mL. Borderline increased parenchymal echogenicity. No hydronephrosis. No visualized stone or focal lesion. Left Kidney: Renal measurements: 9.7 x 4.4 x 4.1 cm = volume: 92 mL. Borderline increased parenchymal echogenicity. No hydronephrosis. No visualized stone or focal lesion. Bladder: The left ureteral jet is seen. Physiologic bladder distention without wall thickening. 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 Other: Trace ascites in the right upper quadrant. IMPRESSION: 1. No obstructive uropathy. 2. Borderline increased renal parenchymal echogenicity typical of chronic medical renal disease. 3. Right bladder  diverticulum. Electronically Signed   By: Keith Rake M.D.   On: 11/07/2022 19:17   ECHOCARDIOGRAM COMPLETE  Result Date: 11/07/2022  ECHOCARDIOGRAM REPORT   Patient Name:   Robert Stark Date of Exam: 11/07/2022 Medical Rec #:  [id number]        Height:       66.0 in Accession #:    [id number]       Weight:       144.0 lb Date of Birth:  Jun 24, 1946        BSA:          1.739 m Patient Age:    89 years         BP:           111/81 mmHg Patient Gender: M                HR:           95 bpm. Exam Location:  Inpatient Procedure: 2D Echo, Cardiac Doppler and Color Doppler Indications:    Pericardial effusion I31.3  History:        Patient has prior history of Echocardiogram examinations, most                 recent 12/07/2022. Arrythmias:Atrial Fibrillation,                 Signs/Symptoms:Shortness of Breath; Risk Factors:Hypertension,                 Dyslipidemia and Former Smoker.  Sonographer:    Greer Pickerel Referring Phys: [place]  1. Left ventricular ejection fraction, by estimation, is 35 to 40%. The left ventricle has moderately decreased function. The left ventricle demonstrates global hypokinesis. Left ventricular diastolic parameters are indeterminate.  2. Right ventricular systolic function is normal. The right ventricular size is normal. Tricuspid regurgitation signal is inadequate for assessing PA pressure.  3. Left atrial size was mildly dilated.  4. Large pericardial effusion. The pericardial effusion is circumferential. Findings are consistent with cardiac tamponade.  5. The mitral valve is normal in structure. Trivial mitral valve regurgitation. No evidence of mitral stenosis.  6. The aortic valve is normal in structure. Aortic valve regurgitation is not visualized. No aortic stenosis is present.  7. There is borderline dilatation of the aortic root, measuring 39 mm.  8. The inferior vena cava is dilated in size with <50% respiratory variability, suggesting right  atrial pressure of 15 mmHg. Comparison(s): A prior study was performed on 12/07/2021. The left ventricular function is unchanged. The previous study showed a small pericardial effusion anterior to the right ventricle. The effusion is now large with findings of tamponade. FINDINGS  Left Ventricle: Left ventricular ejection fraction, by estimation, is 35 to 40%. The left ventricle has moderately decreased function. The left ventricle demonstrates global hypokinesis. The left ventricular internal cavity size was normal in size. There is no left ventricular hypertrophy. Left ventricular diastolic parameters are indeterminate. Right Ventricle: The right ventricular size is normal. No increase in right ventricular wall thickness. Right ventricular systolic function is normal. Tricuspid regurgitation signal is inadequate for assessing PA pressure. Left Atrium: Left atrial size was mildly dilated. Right Atrium: Right atrial size was normal in size. Pericardium: A large pericardial effusion is present. The pericardial effusion is circumferential. There is diastolic collapse of the right ventricular free wall, diastolic collapse of the right atrial wall, excessive respiratory variation in the mitral valve spectral Doppler velocities and excessive respiratory variation in the tricuspid valve spectral Doppler velocities. There is evidence of cardiac tamponade. Mitral Valve: The mitral valve is normal in structure. Trivial mitral  valve regurgitation. No evidence of mitral valve stenosis. Tricuspid Valve: The tricuspid valve is normal in structure. Tricuspid valve regurgitation is not demonstrated. Aortic Valve: The aortic valve is normal in structure. Aortic valve regurgitation is not visualized. No aortic stenosis is present. Pulmonic Valve: The pulmonic valve was grossly normal. Pulmonic valve regurgitation is not visualized. Aorta: There is borderline dilatation of the aortic root, measuring 39 mm. Venous: The inferior vena  cava is dilated in size with less than 50% respiratory variability, suggesting right atrial pressure of 15 mmHg. IAS/Shunts: No atrial level shunt detected by color flow Doppler.  LEFT VENTRICLE PLAX 2D LVOT diam:     2.20 cm     Diastology LV SV:         51          LV e' medial:    6.42 cm/s LV SV Index:   29          LV E/e' medial:  8.4 LVOT Area:     3.80 cm    LV e' lateral:   4.79 cm/s                            LV E/e' lateral: 11.3  LV Volumes (MOD) LV vol d, MOD A2C: 68.0 ml LV vol d, MOD A4C: 67.3 ml LV vol s, MOD A2C: 40.0 ml LV vol s, MOD A4C: 43.7 ml LV SV MOD A2C:     28.0 ml LV SV MOD A4C:     67.3 ml LV SV MOD BP:      24.6 ml RIGHT VENTRICLE TAPSE (M-mode): 0.6 cm LEFT ATRIUM             Index        RIGHT ATRIUM          Index LA Vol (A2C):   38.6 ml 22.19 ml/m  RA Area:     9.72 cm LA Vol (A4C):   65.7 ml 37.78 ml/m  RA Volume:   20.40 ml 11.73 ml/m LA Biplane Vol: 51.7 ml 29.73 ml/m  AORTIC VALVE LVOT Vmax:   92.60 cm/s LVOT Vmean:  66.400 cm/s LVOT VTI:    0.133 m  AORTA Ao Root diam: 3.90 cm Ao Asc diam:  3.30 cm MITRAL VALVE MV Area (PHT): 5.31 cm    SHUNTS MV Decel Time: 143 msec    Systemic VTI:  0.13 m MV E velocity: 54.15 cm/s  Systemic Diam: 2.20 cm MV A velocity: 56.20 cm/s MV E/A ratio:  0.96 Mihai Croitoru MD Electronically signed by Sanda Klein MD Signature Date/Time: 11/07/2022/6:50:02 PM    Final    DG Chest 2 View  Result Date: 11/07/2022 CLINICAL DATA:  Shortness of breath EXAM: CHEST - 2 VIEW COMPARISON:  09/05/2022 FINDINGS: Significant enlargement of cardiac silhouette since previous exam, cannot exclude pericardial effusion with this cardiac configuration. Mediastinal contours and pulmonary vascularity normal. Subsegmental atelectasis in lingula and at LEFT base. No acute infiltrate, pleural effusion, or pneumothorax. Osseous structures unremarkable. IMPRESSION: Subsegmental atelectasis LEFT base. Significant enlargement of cardiac silhouette since 09/05/2022,  cannot exclude pericardial effusion with this configuration; consider echocardiography assessment. Electronically Signed   By: Lavonia Dana M.D.   On: 11/07/2022 15:06     I have independently reviewed the above radiologic studies and discussed with the patient   Recent Lab Findings: Lab Results  Component Value Date   WBC 10.9 (H) 11/09/2022   HGB 10.2 (L)  11/09/2022   HCT 28.8 (L) 11/09/2022   PLT 152 11/09/2022   GLUCOSE 121 (H) 11/09/2022   ALT 85 (H) 11/08/2022   AST 57 (H) 11/08/2022   NA 134 (L) 11/09/2022   K 3.8 11/09/2022   CL 100 11/09/2022   CREATININE 1.21 11/09/2022   BUN 37 (H) 11/09/2022   CO2 25 11/09/2022   TSH 0.566 10/28/2021   INR 1.3 (H) 11/08/2022    Assessment / Plan:      Pericardial effusion- S/P Pericardiocentesis with removal of 940 cc bloody fluid- unfortunately large effusion persists.. requesting window creation Hemorrhagic Pericarditis Atrial Fibrillation- S/P Ablation 10/23 HTN Chronic Systolic HF  Patient with large pericardial effusion, S/P Pericardiocentesis with removal of 940 cc--patient will require Sub-Xiphoid Pericardial Window creation.  This will be scheduled with Dr. Lavonna Monarch on Friday  Nyela Cortinas, PA-C 11/09/2022 12:33 PM   Agree with above assessment and plan.

## 2022-11-09 NOTE — Progress Notes (Signed)
This chaplain responded to RN consult for creating and updating the Pt. Advance Directive:  HCPOA and Living Will. The grand-daughter-DeLana and family friend are at the bedside.  The Pt. is awake and chooses to participate in AD education with the visitors present. The Pt. answered the chaplain's clarifying questions.  The chaplain is present with the Pt., Pt. granddaughter-DeLana, family friend, notary, and witnesses for the notarizing of the Pt. AD:  HCPOA and LW.  The Pt. chose Robert Stark as his healthcare agent.  The Pt. completed a Living Will. The chaplain gave the Pt. the original AD along with one copy. The chaplain scanned a copy of the Pt. AD into his EMR.  This chaplain is available for F/U spiritual care as needed. The chaplain updated the Pt. RN-Roy and requested Robert Stark be added at the top of Pt. contacts.   Chaplain Sallyanne Kuster [phone]

## 2022-11-09 NOTE — Progress Notes (Addendum)
Initial Nutrition Assessment  DOCUMENTATION CODES:  Non-severe (moderate) malnutrition in context of chronic illness  INTERVENTION:  -Continue heart healthy diet -Provide diet education for home use.  NUTRITION DIAGNOSIS:  Moderate Malnutrition related to chronic illness as evidenced by percent weight loss, mild fat depletion, mild muscle depletion.  GOAL:  Patient will meet greater than or equal to 90% of their needs  MONITOR:  PO intake, Weight trends  REASON FOR ASSESSMENT:  Malnutrition Screening Tool    ASSESSMENT:  Pt is a 76yo M with PMH of HTN, CHF, atrial fib with atrial fib ablation 11/04/22 who presents on 11/07/2022 for the evaluation of SOB with pericardial effusion on CXR.  Visited pt at bedside this AM. Pt si s/p pericardiocentesis and tells me he is planned for a second tomorrow. He reports poor po intake for at least 1 week PTA. But notes significant 6% unintended weight loss since his cardiac issues started about 1 months ago. NFPE shows mild muscle wasting and mild fat loss. Pt meets ASPEN criteria for moderate protein calorie malnutrition.  Appetite during admission has been adequate. Tolerating heart healthy diet well. Likely meeting estimated nutrient needs.  Discussed pt's usual diet. He reports his wife used to do all the cooking and since she passed he relies on canned soups, hot dogs and take out. Notes he does not eat much fresh fruits and vegetables. Pt notes he was given KCl this AM, and asked what foods contain K. Provided examples: potatoes, tomatoes, bananas, melons, oranges, etc. Offered diet education during admission, pt agreeable. RD to follow up with education.  Medications reviewed and include: lipitor, B-complex w/vitamin C, protonix, amiodarone, prn tylenol  Labs reviewed: Na:134, BG:121, BUN:37, Cr:1.21, GFR>60   NUTRITION - FOCUSED PHYSICAL EXAM:  Flowsheet Row Most Recent Value  Orbital Region Mild depletion  Upper Arm Region Mild  depletion  Thoracic and Lumbar Region Mild depletion  Buccal Region No depletion  Temple Region Mild depletion  Clavicle Bone Region Mild depletion  Clavicle and Acromion Bone Region Moderate depletion  Scapular Bone Region Unable to assess  Dorsal Hand Mild depletion  Patellar Region Moderate depletion  Anterior Thigh Region Mild depletion  Posterior Calf Region Mild depletion  Hair Reviewed  Eyes Reviewed  Mouth Reviewed  Skin Reviewed  Nails Reviewed       Diet Order:   Diet Order             Diet Heart Room service appropriate? Yes; Fluid consistency: Thin  Diet effective now                   EDUCATION NEEDS:  Education needs have been addressed (will need additional education)  Skin:  Skin Assessment: Reviewed RN Assessment  Last BM:  11/19  Height:  Ht Readings from Last 1 Encounters:  11/08/22 '5\' 5"'$  (1.651 m)   Weight:  Wt Readings from Last 1 Encounters:  11/09/22 66.6 kg    BMI:  Body mass index is 24.43 kg/m.  Estimated Nutritional Needs:  Kcal:  1665-2000kcal Protein:  80-100g Fluid:  1665-2027m  KCandise Bowens MS, RD, LDN, CNSC See AMiON for contact information

## 2022-11-09 NOTE — Progress Notes (Signed)
Rounding Note    Patient Name: Robert Stark Date of Encounter: 11/09/2022  [facility] Cardiologist: Dr. Sallyanne Kuster  Subjective   Feeling better today  Inpatient Medications    Scheduled Meds:  atorvastatin  40 mg Oral Daily   azelastine  1 spray Each Nare BID   B-complex with vitamin C  1 tablet Oral Daily   Chlorhexidine Gluconate Cloth  6 each Topical Daily   colchicine  0.3 mg Oral Daily   heparin  5,000 Units Subcutaneous Q8H   influenza vaccine adjuvanted  0.5 mL Intramuscular Tomorrow-1000   pantoprazole  40 mg Oral Daily   rOPINIRole  0.25 mg Oral QHS   sodium chloride flush  3 mL Intravenous Q12H   sodium chloride flush  3 mL Intravenous Q12H   sodium chloride flush  3 mL Intravenous Q12H   tamsulosin  0.4 mg Oral QPC supper   Continuous Infusions:  sodium chloride     sodium chloride     amiodarone 30 mg/hr (11/09/22 0800)   PRN Meds: sodium chloride, sodium chloride, acetaminophen, acetaminophen, benzonatate, melatonin, metoprolol tartrate, ondansetron (ZOFRAN) IV, ondansetron (ZOFRAN) IV, sodium chloride flush, sodium chloride flush   Vital Signs    Vitals:   11/09/22 0545 11/09/22 0600 11/09/22 0700 11/09/22 0800  BP:  109/62 110/73 110/70  Pulse: 72 69 77 76  Resp: (!) 24 (!) '21 18 17  '$ Temp:      TempSrc:      SpO2: 98% 99% (!) 86% 99%  Weight:      Height:        Intake/Output Summary (Last 24 hours) at 11/09/2022 1020 Last data filed at 11/09/2022 1006 Gross per 24 hour  Intake 804.13 ml  Output 2835 ml  Net -2030.87 ml      11/09/2022    5:00 AM 11/08/2022    3:12 PM 11/08/2022    4:08 AM  Last 3 Weights  Weight (lbs) 146 lb 13.2 oz 142 lb 10.2 oz 140 lb  Weight (kg) 66.6 kg 64.7 kg 63.504 kg      Telemetry    SR 70's-80s - Personally Reviewed  ECG    No new EKGs - Personally Reviewed  Physical Exam   GEN: No acute distress.   Neck: No JVD Cardiac: RRR, no murmurs, no rubs, or gallops, heart sounds are  not distant  Pericardial drain in place: is CDI Respiratory: CTA b/l. GI: Soft, nontender, non-distended  MS: No edema; No deformity. Neuro:  Nonfocal  Psych: Normal affect   Labs    High Sensitivity Troponin:   Recent Labs  Lab 11/07/22 1414 11/07/22 1615  TROPONINIHS 19* 17     Chemistry Recent Labs  Lab 11/07/22 1414 11/07/22 1509 11/08/22 0413 11/09/22 0233  NA 135 132* 135 134*  K 4.5 4.4 4.1 3.8  CL 99  --  101 100  CO2 23  --  21* 25  GLUCOSE 113*  --  172* 121*  BUN 57*  --  61* 37*  CREATININE 2.59*  --  2.08* 1.21  CALCIUM 9.2  --  9.2 8.4*  PROT 6.1*  --  5.9*  --   ALBUMIN 3.5  --  3.2*  --   AST 46*  --  57*  --   ALT 82*  --  85*  --   ALKPHOS 133*  --  134*  --   BILITOT 1.2  --  1.4*  --   GFRNONAA 25*  --  33* >60  ANIONGAP 13  --  13 9    Lipids No results for input(s): "CHOL", "TRIG", "HDL", "LABVLDL", "LDLCALC", "CHOLHDL" in the last 168 hours.  Hematology Recent Labs  Lab 11/07/22 1414 11/07/22 1509 11/08/22 0413 11/09/22 0519  WBC 11.1*  --  12.3* 10.9*  RBC 3.45*  --  3.52* 3.36*  HGB 10.5* 9.9* 10.6* 10.2*  HCT 30.7* 29.0* 31.4* 28.8*  MCV 89.0  --  89.2 85.7  MCH 30.4  --  30.1 30.4  MCHC 34.2  --  33.8 35.4  RDW 14.4  --  14.6 14.1  PLT 148*  --  151 152   Thyroid No results for input(s): "TSH", "FREET4" in the last 168 hours.  BNP Recent Labs  Lab 11/07/22 1414  BNP 70.2    DDimer No results for input(s): "DDIMER" in the last 168 hours.   Radiology     Cardiac Studies    11/08/22: limited echo 1. Large pericardial effusion. The pericardial effusion is surrounding  the apex. There RV does not fully expand. From 8:10 AM to 8:32 AM (last  image) improvedment in size of effusion. Still severe effusion at last  image.  Comparison(s): Prior images reviewed side by side. Effusion size has  improved.   FINDINGS   Pericardium: A large pericardial effusion is present. The pericardial  effusion is surrounding the  apex. There is diastolic collapse of the right  ventricular free wall.    11/07/22: TTE 1. Left ventricular ejection fraction, by estimation, is 35 to 40%. The  left ventricle has moderately decreased function. The left ventricle  demonstrates global hypokinesis. Left ventricular diastolic parameters are  indeterminate.   2. Right ventricular systolic function is normal. The right ventricular  size is normal. Tricuspid regurgitation signal is inadequate for assessing  PA pressure.   3. Left atrial size was mildly dilated.   4. Large pericardial effusion. The pericardial effusion is  circumferential. Findings are consistent with cardiac tamponade.   5. The mitral valve is normal in structure. Trivial mitral valve  regurgitation. No evidence of mitral stenosis.   6. The aortic valve is normal in structure. Aortic valve regurgitation is  not visualized. No aortic stenosis is present.   7. There is borderline dilatation of the aortic root, measuring 39 mm.   8. The inferior vena cava is dilated in size with <50% respiratory  variability, suggesting right atrial pressure of 15 mmHg.   Comparison(s): A prior study was performed on 12/07/2021. The left  ventricular function is unchanged. The previous study showed a small  pericardial effusion anterior to the right ventricle. The effusion is now  large with findings of tamponade.    10/04/22 EPS/ablation CONCLUSIONS: 1. Successful PVI 2. Successful ablation/isolation of the posterior wall 3. Intracardiac echo reveals trivial pericardial effusion, left sided common PV ostium, large mobile eustachian valve. 4. No early apparent complications. 5. Protonix '40mg'$  PO daily x 45 days 6. Colchicine 0.'6mg'$  PO BID x 5 days   (NOTE: Due to the large mobile eustachian valve,  elected not to ablate the CTI).    06/29/22: TEE 1. Left ventricular ejection fraction, by estimation, is 25 to 30%. The  left ventricle has severely decreased function.   2.  Right ventricular systolic function is mildly reduced. The right  ventricular size is normal.   3. Left atrial size was moderately dilated. No left atrial/left atrial  appendage thrombus was detected.   4. Right atrial size was severely dilated.  5. The mitral valve is normal in structure. Trivial mitral valve  regurgitation.   6. The aortic valve is tricuspid. Aortic valve regurgitation is trivial.   7. There is mild (Grade II) plaque.   8. Agitated saline contrast bubble study was negative, with no evidence  of any interatrial shunt.    Patient Profile     76 y.o. 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 Lanice Shirts team managing  Dr. Quentin Ore had seen the patient this morning, unable to pull any fluid from the drain. However: Await formal reading, verbally via Dr. Vivia Ewing, remains with significant amount of loculated effusion, CTS consulted    Persistent AFib CHA2DS2Vasc is 3, on Eliquis out pt Held 2/2 above Returned to SR last night Continue amiodarone gtt     3. AKI Much better    For questions or updates, please contact [place] Please consult [email or web address] for contact info under        Signed, Baldwin [place], PA-C  11/09/2022, 10:20 AM

## 2022-11-09 NOTE — Progress Notes (Addendum)
Rounding Note    Patient Name: Robert Stark Date of Encounter: 11/09/2022  [place] Cardiologist: None   Subjective   Feels well, has minimal pleuritic chest discomfort. Unfortunately, review of echo images at bedside shows that he still has a large pericardial effusion that appears to be loculated with extensive strands of fibrin.  Thankfully, no signs of tamponade.  The inferior vena cava is still dilated, but there is no evidence of diastolic chamber collapse or excessive respiratory flow variation on AV valve inflow. Unable to really aspirate any more fluid from his pericardial drain.  Inpatient Medications    Scheduled Meds:  atorvastatin  40 mg Oral Daily   azelastine  1 spray Each Nare BID   B-complex with vitamin C  1 tablet Oral Daily   Chlorhexidine Gluconate Cloth  6 each Topical Daily   colchicine  0.3 mg Oral Daily   heparin  5,000 Units Subcutaneous Q8H   influenza vaccine adjuvanted  0.5 mL Intramuscular Tomorrow-1000   pantoprazole  40 mg Oral Daily   rOPINIRole  0.25 mg Oral QHS   sodium chloride flush  3 mL Intravenous Q12H   sodium chloride flush  3 mL Intravenous Q12H   sodium chloride flush  3 mL Intravenous Q12H   tamsulosin  0.4 mg Oral QPC supper   Continuous Infusions:  sodium chloride     sodium chloride     amiodarone 30 mg/hr (11/09/22 0800)   PRN Meds: sodium chloride, sodium chloride, acetaminophen, acetaminophen, benzonatate, melatonin, metoprolol tartrate, ondansetron (ZOFRAN) IV, ondansetron (ZOFRAN) IV, sodium chloride flush, sodium chloride flush   Vital Signs    Vitals:   11/09/22 0545 11/09/22 0600 11/09/22 0700 11/09/22 0800  BP:  109/62 110/73 110/70  Pulse: 72 69 77 76  Resp: (!) 24 (!) '21 18 17  '$ Temp:      TempSrc:      SpO2: 98% 99% (!) 86% 99%  Weight:      Height:        Intake/Output Summary (Last 24 hours) at 11/09/2022 1025 Last data filed at 11/09/2022 1006 Gross per 24 hour  Intake 804.13 ml   Output 2835 ml  Net -2030.87 ml      11/09/2022    5:00 AM 11/08/2022    3:12 PM 11/08/2022    4:08 AM  Last 3 Weights  Weight (lbs) 146 lb 13.2 oz 142 lb 10.2 oz 140 lb  Weight (kg) 66.6 kg 64.7 kg 63.504 kg      Telemetry    Was in atrial fibrillation until roughly 1945 hrs last night.  In normal sinus rhythm since then - Personally Reviewed  ECG    No new tracing- Personally Reviewed  Physical Exam  Appears comfortable, but worried GEN: No acute distress.   Neck: No JVD Cardiac: RRR, no murmurs, rubs, or gallops.  Respiratory: Clear to auscultation bilaterally. GI: Soft, nontender, non-distended  MS: No edema; No deformity. Neuro:  Nonfocal  Psych: Normal affect   Labs    High Sensitivity Troponin:   Recent Labs  Lab 11/07/22 1414 11/07/22 1615  TROPONINIHS 19* 17     Chemistry Recent Labs  Lab 11/07/22 1414 11/07/22 1509 11/08/22 0413 11/09/22 0233  NA 135 132* 135 134*  K 4.5 4.4 4.1 3.8  CL 99  --  101 100  CO2 23  --  21* 25  GLUCOSE 113*  --  172* 121*  BUN 57*  --  61* 37*  CREATININE  2.59*  --  2.08* 1.21  CALCIUM 9.2  --  9.2 8.4*  PROT 6.1*  --  5.9*  --   ALBUMIN 3.5  --  3.2*  --   AST 46*  --  57*  --   ALT 82*  --  85*  --   ALKPHOS 133*  --  134*  --   BILITOT 1.2  --  1.4*  --   GFRNONAA 25*  --  33* >60  ANIONGAP 13  --  13 9    Lipids No results for input(s): "CHOL", "TRIG", "HDL", "LABVLDL", "LDLCALC", "CHOLHDL" in the last 168 hours.  Hematology Recent Labs  Lab 11/07/22 1414 11/07/22 1509 11/08/22 0413 11/09/22 0519  WBC 11.1*  --  12.3* 10.9*  RBC 3.45*  --  3.52* 3.36*  HGB 10.5* 9.9* 10.6* 10.2*  HCT 30.7* 29.0* 31.4* 28.8*  MCV 89.0  --  89.2 85.7  MCH 30.4  --  30.1 30.4  MCHC 34.2  --  33.8 35.4  RDW 14.4  --  14.6 14.1  PLT 148*  --  151 152   Thyroid No results for input(s): "TSH", "FREET4" in the last 168 hours.  BNP Recent Labs  Lab 11/07/22 1414  BNP 70.2    DDimer No results for input(s):  "DDIMER" in the last 168 hours.   Radiology    DG CHEST PORT 1 VIEW  Result Date: 11/09/2022 CLINICAL DATA:  Pericardial effusion, weakness, shortness of breath, post pericardiocentesis on 11/08/2022 EXAM: PORTABLE CHEST 1 VIEW COMPARISON:  Portable exam 0703 hours compared to 11/07/2022 FINDINGS: Enlargement of cardiac silhouette unchanged since 11/07/2022. Mediastinal contours and pulmonary vascularity normal. LEFT pleural effusion and basilar atelectasis. Upper lungs clear. No infiltrate or pneumothorax. IMPRESSION: Persistent enlargement of cardiac silhouette despite interval pericardiocentesis, question recurrent pericardial effusion. Electronically Signed   By: Lavonia Dana M.D.   On: 11/09/2022 08:43   ECHOCARDIOGRAM LIMITED  Result Date: 11/08/2022    ECHOCARDIOGRAM LIMITED REPORT   Patient Name:   Robert Stark Date of Exam: 11/08/2022 Medical Rec #:  [id number]        Height:       65.0 in Accession #:    [id number]       Weight:       140.0 lb Date of Birth:  [birth year]        BSA:          1.700 m Patient Age:    39 years         BP:           100/82 mmHg Patient Gender: M                HR:           100 bpm. Exam Location:  Inpatient Procedure: Limited Echo Indications:    Pericardial effusion I31.3  History:        Patient has prior history of Echocardiogram examinations, most                 recent 11/07/2022. Risk Factors:Hypertension. Acute                 pericarditis. 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 Acute                 transaminitis. Chronic normocytic anemia.  Sonographer:    Darlina Sicilian RDCS Referring Phys: [place]  1. Large pericardial effusion. The pericardial effusion is surrounding the  apex. There RV does not fully expand. From 8:10 AM to 8:32 AM (last image) improvedment in size of effusion. Still severe effusion at last image. Comparison(s): Prior images reviewed side by side. Effusion size has improved. FINDINGS  Pericardium: A large  pericardial effusion is present. The pericardial effusion is surrounding the apex. There is diastolic collapse of the right ventricular free wall. Rudean Haskell MD Electronically signed by Rudean Haskell MD Signature Date/Time: 11/08/2022/9:01:44 AM    Final    CARDIAC CATHETERIZATION  Result Date: 11/08/2022   Successful pericardiocentesis from the subxiphoid approach with removal of 940 cc of bloody fluid.   Catheter sutured in place. Catheter sutured in place.  Catheter to be removed based on drainage.  Based on the echo, there does still appear to be some pericardial fluid, particularly at the apex.  The patient tolerated the procedure well. I called both emergency contacts that were listed in the chart but both numbers went to voicemail.   US RENAL  Result Date: 11/07/2022 CLINICAL DATA:  Acute kidney injury. EXAM: RENAL / URINARY TRACT ULTRASOUND COMPLETE COMPARISON:  None Available. FINDINGS: Right Kidney: Renal measurements: 10.6 x 4.7 x 4.5 cm = volume: 117 mL. Borderline increased parenchymal echogenicity. No hydronephrosis. No visualized stone or focal lesion. Left Kidney: Renal measurements: 9.7 x 4.4 x 4.1 cm = volume: 92 mL. Borderline increased parenchymal echogenicity. No hydronephrosis. No visualized stone or focal lesion. Bladder: The left ureteral jet is seen. Physiologic bladder distention without wall thickening. 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 Other: Trace ascites in the right upper quadrant. IMPRESSION: 1. No obstructive uropathy. 2. Borderline increased renal parenchymal echogenicity typical of chronic medical renal disease. 3. Right bladder diverticulum. Electronically Signed   By: Keith Rake M.D.   On: 11/07/2022 19:17   ECHOCARDIOGRAM COMPLETE  Result Date: 11/07/2022    ECHOCARDIOGRAM REPORT   Patient Name:   DEMETRIAS GOODBAR Date of Exam: 11/07/2022 Medical Rec #:  [id number]        Height:       66.0 in Accession #:    [id number]       Weight:        144.0 lb Date of Birth:  Sep 08, 1946        BSA:          1.739 m Patient Age:    43 years         BP:           111/81 mmHg Patient Gender: M                HR:           95 bpm. Exam Location:  Inpatient Procedure: 2D Echo, Cardiac Doppler and Color Doppler Indications:    Pericardial effusion I31.3  History:        Patient has prior history of Echocardiogram examinations, most                 recent 12/07/2022. Arrythmias:Atrial Fibrillation,                 Signs/Symptoms:Shortness of Breath; Risk Factors:Hypertension,                 Dyslipidemia and Former Smoker.  Sonographer:    Greer Pickerel Referring Phys: [place]  1. Left ventricular ejection fraction, by estimation, is 35 to 40%. The left ventricle has moderately decreased function. The left ventricle demonstrates global hypokinesis. Left ventricular diastolic parameters are indeterminate.  2. Right ventricular systolic function is normal. The right ventricular size is normal. Tricuspid regurgitation signal is inadequate for assessing PA pressure.  3. Left atrial size was mildly dilated.  4. Large pericardial effusion. The pericardial effusion is circumferential. Findings are consistent with cardiac tamponade.  5. The mitral valve is normal in structure. Trivial mitral valve regurgitation. No evidence of mitral stenosis.  6. The aortic valve is normal in structure. Aortic valve regurgitation is not visualized. No aortic stenosis is present.  7. There is borderline dilatation of the aortic root, measuring 39 mm.  8. The inferior vena cava is dilated in size with <50% respiratory variability, suggesting right atrial pressure of 15 mmHg. Comparison(s): A prior study was performed on 12/07/2021. The left ventricular function is unchanged. The previous study showed a small pericardial effusion anterior to the right ventricle. The effusion is now large with findings of tamponade. FINDINGS  Left Ventricle: Left ventricular ejection  fraction, by estimation, is 35 to 40%. The left ventricle has moderately decreased function. The left ventricle demonstrates global hypokinesis. The left ventricular internal cavity size was normal in size. There is no left ventricular hypertrophy. Left ventricular diastolic parameters are indeterminate. Right Ventricle: The right ventricular size is normal. No increase in right ventricular wall thickness. Right ventricular systolic function is normal. Tricuspid regurgitation signal is inadequate for assessing PA pressure. Left Atrium: Left atrial size was mildly dilated. Right Atrium: Right atrial size was normal in size. Pericardium: A large pericardial effusion is present. The pericardial effusion is circumferential. There is diastolic collapse of the right ventricular free wall, diastolic collapse of the right atrial wall, excessive respiratory variation in the mitral valve spectral Doppler velocities and excessive respiratory variation in the tricuspid valve spectral Doppler velocities. There is evidence of cardiac tamponade. Mitral Valve: The mitral valve is normal in structure. Trivial mitral valve regurgitation. No evidence of mitral valve stenosis. Tricuspid Valve: The tricuspid valve is normal in structure. Tricuspid valve regurgitation is not demonstrated. Aortic Valve: The aortic valve is normal in structure. Aortic valve regurgitation is not visualized. No aortic stenosis is present. Pulmonic Valve: The pulmonic valve was grossly normal. Pulmonic valve regurgitation is not visualized. Aorta: There is borderline dilatation of the aortic root, measuring 39 mm. Venous: The inferior vena cava is dilated in size with less than 50% respiratory variability, suggesting right atrial pressure of 15 mmHg. IAS/Shunts: No atrial level shunt detected by color flow Doppler.  LEFT VENTRICLE PLAX 2D LVOT diam:     2.20 cm     Diastology LV SV:         51          LV e' medial:    6.42 cm/s LV SV Index:   29          LV  E/e' medial:  8.4 LVOT Area:     3.80 cm    LV e' lateral:   4.79 cm/s                            LV E/e' lateral: 11.3  LV Volumes (MOD) LV vol d, MOD A2C: 68.0 ml LV vol d, MOD A4C: 67.3 ml LV vol s, MOD A2C: 40.0 ml LV vol s, MOD A4C: 43.7 ml LV SV MOD A2C:     28.0 ml LV SV MOD A4C:     67.3 ml LV SV MOD BP:      24.6 ml RIGHT VENTRICLE  TAPSE (M-mode): 0.6 cm LEFT ATRIUM             Index        RIGHT ATRIUM          Index LA Vol (A2C):   38.6 ml 22.19 ml/m  RA Area:     9.72 cm LA Vol (A4C):   65.7 ml 37.78 ml/m  RA Volume:   20.40 ml 11.73 ml/m LA Biplane Vol: 51.7 ml 29.73 ml/m  AORTIC VALVE LVOT Vmax:   92.60 cm/s LVOT Vmean:  66.400 cm/s LVOT VTI:    0.133 m  AORTA Ao Root diam: 3.90 cm Ao Asc diam:  3.30 cm MITRAL VALVE MV Area (PHT): 5.31 cm    SHUNTS MV Decel Time: 143 msec    Systemic VTI:  0.13 m MV E velocity: 54.15 cm/s  Systemic Diam: 2.20 cm MV A velocity: 56.20 cm/s MV E/A ratio:  0.96 Lynnda Wiersma MD Electronically signed by Sanda Klein MD Signature Date/Time: 11/07/2022/6:50:02 PM    Final    DG Chest 2 View  Result Date: 11/07/2022 CLINICAL DATA:  Shortness of breath EXAM: CHEST - 2 VIEW COMPARISON:  09/05/2022 FINDINGS: Significant enlargement of cardiac silhouette since previous exam, cannot exclude pericardial effusion with this cardiac configuration. Mediastinal contours and pulmonary vascularity normal. Subsegmental atelectasis in lingula and at LEFT base. No acute infiltrate, pleural effusion, or pneumothorax. Osseous structures unremarkable. IMPRESSION: Subsegmental atelectasis LEFT base. Significant enlargement of cardiac silhouette since 09/05/2022, cannot exclude pericardial effusion with this configuration; consider echocardiography assessment. Electronically Signed   By: Lavonia Dana M.D.   On: 11/07/2022 15:06    Cardiac Studies   review of echo images at bedside shows that he still has a large pericardial effusion that appears to be loculated with extensive  strands of fibrin.  Thankfully, no signs of tamponade.  The inferior vena cava is still dilated, but there is no evidence of diastolic chamber collapse or excessive respiratory flow variation on AV valve inflow.  Patient Profile     76 y.o. male with pericardial tamponade due to hemorrhagic pericardial effusion, likely due to delayed postprocedural pericarditis.  Underwent pericardiocentesis 11/08/2022 with marked hemodynamic improvement, but echocardiogram still shows a large loculated pericardial effusion.  Assessment & Plan    Unfortunately, I think he will require surgical intervention to debride the pericardial space and placed a chest tube.  Unable to really aspirate any fluid from the pericardial drain.  I do not think that manipulating the drain will make a big difference since the pericardial fluid appears to be loculated and multiple pockets. At this point he is hemodynamically compensated, has good urine output and marked improvement in his renal parameters.  Surgical intervention is not imminently necessary, but cannot be sure that he will not deteriorate over the Thanksgiving holiday. CVTS consultation requested.  CRITICAL CARE Performed by: Dani Gobble Lyriq Finerty   Total critical care time: 35 minutes  Critical care time was exclusive of separately billable procedures and treating other patients.  Critical care was necessary to treat or prevent imminent or life-threatening deterioration.  Critical care was time spent personally by me on the following activities: development of treatment plan with patient and/or surrogate as well as nursing, discussions with consultants, evaluation of patient's response to treatment, examination of patient, obtaining history from patient or surrogate, ordering and performing treatments and interventions, ordering and review of laboratory studies, ordering and review of radiographic studies, pulse oximetry and re-evaluation of patient's condition.   For  questions or updates, please contact [place] Please consult [email or web address] for contact info under        Signed, Sanda Klein, MD  11/09/2022, 10:25 AM

## 2022-11-10 DIAGNOSIS — E440 Moderate protein-calorie malnutrition: Secondary | ICD-10-CM | POA: Insufficient documentation

## 2022-11-10 DIAGNOSIS — I4819 Other persistent atrial fibrillation: Secondary | ICD-10-CM | POA: Diagnosis not present

## 2022-11-10 DIAGNOSIS — I3139 Other pericardial effusion (noninflammatory): Secondary | ICD-10-CM | POA: Diagnosis not present

## 2022-11-10 LAB — BASIC METABOLIC PANEL
Anion gap: 7 (ref 5–15)
BUN: 23 mg/dL (ref 8–23)
CO2: 27 mmol/L (ref 22–32)
Calcium: 8.3 mg/dL — ABNORMAL LOW (ref 8.9–10.3)
Chloride: 102 mmol/L (ref 98–111)
Creatinine, Ser: 1.17 mg/dL (ref 0.61–1.24)
GFR, Estimated: >60 mL/min (ref >60)
Glucose, Bld: 106 mg/dL — ABNORMAL HIGH (ref 70–99)
Potassium: 4.6 mmol/L (ref 3.5–5.1)
Sodium: 136 mmol/L (ref 135–145)

## 2022-11-10 LAB — ABO/RH: ABO/RH(D): O POS

## 2022-11-10 LAB — COXSACKIE A VIRUS ANTIBODIES
Coxsackie A16 IgG: 1:1600 {titer} — ABNORMAL HIGH
Coxsackie A16 IgM: NEGATIVE titer
Coxsackie A24 IgG: >1:1600 {titer} — AB
Coxsackie A24 IgM: NEGATIVE titer
Coxsackie A7 IgG: 1:1600 {titer} — ABNORMAL HIGH
Coxsackie A7 IgM: NEGATIVE titer
Coxsackie A9 IgG: 1:1600 {titer} — ABNORMAL HIGH
Coxsackie A9 IgM: NEGATIVE titer

## 2022-11-10 LAB — COXSACKIE B VIRUS ANTIBODIES
Coxsackie B1 Ab: 1:32 {titer} — ABNORMAL HIGH
Coxsackie B2 Ab: 1:64 {titer} — ABNORMAL HIGH
Coxsackie B4 Ab: 1:32 {titer} — ABNORMAL HIGH
Coxsackie B5 Ab: 1:128 {titer} — ABNORMAL HIGH
Coxsackie B6 Ab: 1:64 {titer} — ABNORMAL HIGH

## 2022-11-10 LAB — TYPE AND SCREEN
ABO/RH(D): O POS
Antibody Screen: NEGATIVE

## 2022-11-10 LAB — SURGICAL PCR SCREEN
MRSA, PCR: NEGATIVE
Staphylococcus aureus: NEGATIVE

## 2022-11-10 LAB — LIPOPROTEIN A (LPA): Lipoprotein (a): <8.4 nmol/L (ref <75.0)

## 2022-11-10 LAB — MAGNESIUM: Magnesium: 1.3 mg/dL — ABNORMAL LOW (ref 1.7–2.4)

## 2022-11-10 MED ORDER — SODIUM CHLORIDE 0.9 % IV SOLN: INTRAVENOUS | Status: DC | PRN

## 2022-11-10 MED ORDER — MAGNESIUM SULFATE 4 GM/100ML IV SOLN
4.0000 g | Freq: Once | INTRAVENOUS | Status: AC
  Administered 2022-11-10: 4 g via INTRAVENOUS
  Filled 2022-11-10: qty 100

## 2022-11-10 MED ORDER — STERILE WATER FOR INJECTION IJ SOLN
INTRAMUSCULAR | Status: AC
  Filled 2022-11-10: qty 10

## 2022-11-10 MED ORDER — MUPIROCIN 2 % EX OINT: 1.0000 | TOPICAL_OINTMENT | Freq: Two times a day (BID) | CUTANEOUS | Status: DC

## 2022-11-10 MED ORDER — CHLORHEXIDINE GLUCONATE CLOTH 2 % EX PADS
6.0000 | MEDICATED_PAD | Freq: Once | CUTANEOUS | Status: AC
  Administered 2022-11-10: 6 via TOPICAL

## 2022-11-10 MED ORDER — CHLORHEXIDINE GLUCONATE 0.12 % MT SOLN
15.0000 mL | Freq: Once | OROMUCOSAL | Status: AC
  Administered 2022-11-11: 15 mL via OROMUCOSAL
  Filled 2022-11-10: qty 15

## 2022-11-10 NOTE — Progress Notes (Signed)
      [address]       [place] [address]             [phone]      2 Days Post-Op  Procedure(s) (LRB): PERICARDIOCENTESIS (N/A)   Total Length of Stay:  LOS: 3 days    SUBJECTIVE: Feels well. Eating breakfast this am. Has some left sided chest wall discomfort Vitals:   11/10/22 0600 11/10/22 0635  BP: 101/62   Pulse: 79 83  Resp: 20 20  Temp:  98.6 F (37 C)  SpO2: 94% 95%    Intake/Output      11/22 0701 11/23 0700 11/23 0701 11/24 0700   P.O. 540    I.V. (mL/kg) 110.6 (1.7)    Total Intake(mL/kg) 650.6 (10.2)    Urine (mL/kg/hr) 1200 (0.8)    Drains 80    Total Output 1280    Net -629.4             sodium chloride Stopped (11/09/22 1233)   sodium chloride      CBC    Component Value Date/Time   WBC 10.9 (H) 11/09/2022 0519   RBC 3.36 (L) 11/09/2022 0519   HGB 10.2 (L) 11/09/2022 0519   HGB 12.7 (L) 09/22/2022 1332   HCT 28.8 (L) 11/09/2022 0519   HCT 37.2 (L) 09/22/2022 1332   PLT 152 11/09/2022 0519   PLT 92 (LL) 09/22/2022 1332   MCV 85.7 11/09/2022 0519   MCV 89 09/22/2022 1332   MCH 30.4 11/09/2022 0519   MCHC 35.4 11/09/2022 0519   RDW 14.1 11/09/2022 0519   RDW 14.4 09/22/2022 1332   LYMPHSABS 1.0 11/09/2022 0519   LYMPHSABS 1.5 09/22/2022 1332   MONOABS 4.2 (H) 11/09/2022 0519   EOSABS 0.1 11/09/2022 0519   EOSABS 0.1 09/22/2022 1332   BASOSABS 0.0 11/09/2022 0519   BASOSABS 0.0 09/22/2022 1332   CMP     Component Value Date/Time   NA 136 11/10/2022 0547   NA 144 09/22/2022 1332   K 4.6 11/10/2022 0547   CL 102 11/10/2022 0547   CO2 27 11/10/2022 0547   GLUCOSE 106 (H) 11/10/2022 0547   BUN 23 11/10/2022 0547   BUN 29 (H) 09/22/2022 1332   CREATININE 1.17 11/10/2022 0547   CALCIUM 8.3 (L) 11/10/2022 0547   PROT 5.9 (L) 11/08/2022 0413   ALBUMIN 3.2 (L) 11/08/2022 0413   AST 57 (H) 11/08/2022 0413   ALT 85 (H) 11/08/2022 0413   ALKPHOS 134 (H) 11/08/2022 0413   BILITOT 1.4 (H) 11/08/2022 0413    GFRNONAA >60 11/10/2022 0547   ABG    Component Value Date/Time   PHART 7.456 (H) 11/07/2022 1509   PCO2ART 28.6 (L) 11/07/2022 1509   PO2ART 87 11/07/2022 1509   HCO3 20.2 11/07/2022 1509   TCO2 21 (L) 11/07/2022 1509   ACIDBASEDEF 3.0 (H) 11/07/2022 1509   O2SAT 97 11/07/2022 1509   CBG (last 3)  No results for input(s): "GLUCAP" in the last 72 hours.   ASSESSMENT: Pericardial Effusion sp ablation procedure Hemodynamically stable. NSR For subxyphoid drainage of residual effusion tomorrow following NOAC washout   [name], MD '@DATE'$ @

## 2022-11-10 NOTE — H&P (View-Only) (Signed)
      [address]       [place] [address]             [phone]      2 Days Post-Op  Procedure(s) (LRB): PERICARDIOCENTESIS (N/A)   Total Length of Stay:  LOS: 3 days    SUBJECTIVE: Feels well. Eating breakfast this am. Has some left sided chest wall discomfort Vitals:   11/10/22 0600 11/10/22 0635  BP: 101/62   Pulse: 79 83  Resp: 20 20  Temp:  98.6 F (37 C)  SpO2: 94% 95%    Intake/Output      11/22 0701 11/23 0700 11/23 0701 11/24 0700   P.O. 540    I.V. (mL/kg) 110.6 (1.7)    Total Intake(mL/kg) 650.6 (10.2)    Urine (mL/kg/hr) 1200 (0.8)    Drains 80    Total Output 1280    Net -629.4             sodium chloride Stopped (11/09/22 1233)   sodium chloride      CBC    Component Value Date/Time   WBC 10.9 (H) 11/09/2022 0519   RBC 3.36 (L) 11/09/2022 0519   HGB 10.2 (L) 11/09/2022 0519   HGB 12.7 (L) 09/22/2022 1332   HCT 28.8 (L) 11/09/2022 0519   HCT 37.2 (L) 09/22/2022 1332   PLT 152 11/09/2022 0519   PLT 92 (LL) 09/22/2022 1332   MCV 85.7 11/09/2022 0519   MCV 89 09/22/2022 1332   MCH 30.4 11/09/2022 0519   MCHC 35.4 11/09/2022 0519   RDW 14.1 11/09/2022 0519   RDW 14.4 09/22/2022 1332   LYMPHSABS 1.0 11/09/2022 0519   LYMPHSABS 1.5 09/22/2022 1332   MONOABS 4.2 (H) 11/09/2022 0519   EOSABS 0.1 11/09/2022 0519   EOSABS 0.1 09/22/2022 1332   BASOSABS 0.0 11/09/2022 0519   BASOSABS 0.0 09/22/2022 1332   CMP     Component Value Date/Time   NA 136 11/10/2022 0547   NA 144 09/22/2022 1332   K 4.6 11/10/2022 0547   CL 102 11/10/2022 0547   CO2 27 11/10/2022 0547   GLUCOSE 106 (H) 11/10/2022 0547   BUN 23 11/10/2022 0547   BUN 29 (H) 09/22/2022 1332   CREATININE 1.17 11/10/2022 0547   CALCIUM 8.3 (L) 11/10/2022 0547   PROT 5.9 (L) 11/08/2022 0413   ALBUMIN 3.2 (L) 11/08/2022 0413   AST 57 (H) 11/08/2022 0413   ALT 85 (H) 11/08/2022 0413   ALKPHOS 134 (H) 11/08/2022 0413   BILITOT 1.4 (H) 11/08/2022 0413    GFRNONAA >60 11/10/2022 0547   ABG    Component Value Date/Time   PHART 7.456 (H) 11/07/2022 1509   PCO2ART 28.6 (L) 11/07/2022 1509   PO2ART 87 11/07/2022 1509   HCO3 20.2 11/07/2022 1509   TCO2 21 (L) 11/07/2022 1509   ACIDBASEDEF 3.0 (H) 11/07/2022 1509   O2SAT 97 11/07/2022 1509   CBG (last 3)  No results for input(s): "GLUCAP" in the last 72 hours.   ASSESSMENT: Pericardial Effusion sp ablation procedure Hemodynamically stable. NSR For subxyphoid drainage of residual effusion tomorrow following NOAC washout   [name], MD '@DATE'$ @

## 2022-11-10 NOTE — Progress Notes (Signed)
Rounding Note    Patient Name: Robert Stark Date of Encounter: 11/10/2022  [place] Cardiologist: None   Subjective   NAEO. Planning for pericardial window tomorrow with PW.   Inpatient Medications    Scheduled Meds:  amiodarone  400 mg Oral Daily   atorvastatin  40 mg Oral Daily   azelastine  1 spray Each Nare BID   B-complex with vitamin C  1 tablet Oral Daily   Chlorhexidine Gluconate Cloth  6 each Topical Daily   colchicine  0.3 mg Oral Daily   heparin  5,000 Units Subcutaneous Q8H   influenza vaccine adjuvanted  0.5 mL Intramuscular Tomorrow-1000   pantoprazole  40 mg Oral Daily   sodium chloride flush  3 mL Intravenous Q12H   sodium chloride flush  3 mL Intravenous Q12H   sodium chloride flush  3 mL Intravenous Q12H   tamsulosin  0.4 mg Oral QPC supper   Continuous Infusions:  sodium chloride Stopped (11/09/22 1233)   sodium chloride     PRN Meds: sodium chloride, sodium chloride, acetaminophen, acetaminophen, benzonatate, melatonin, metoprolol tartrate, ondansetron (ZOFRAN) IV, ondansetron (ZOFRAN) IV, oxyCODONE-acetaminophen, sodium chloride flush, sodium chloride flush   Vital Signs    Vitals:   11/10/22 0500 11/10/22 0600 11/10/22 0635 11/10/22 0730  BP: 103/69 101/62    Pulse: 76 79 83   Resp: '20 20 20   '$ Temp:   98.6 F (37 C) 98.6 F (37 C)  TempSrc:    Oral  SpO2: 96% 94% 95%   Weight: 63.8 kg     Height:        Intake/Output Summary (Last 24 hours) at 11/10/2022 0807 Last data filed at 11/10/2022 1610 Gross per 24 hour  Intake 377.31 ml  Output 1010 ml  Net -632.69 ml      11/10/2022    5:00 AM 11/09/2022    5:00 AM 11/08/2022    3:12 PM  Last 3 Weights  Weight (lbs) 140 lb 10.5 oz 146 lb 13.2 oz 142 lb 10.2 oz  Weight (kg) 63.8 kg 66.6 kg 64.7 kg      Telemetry    Personally Reviewed  ECG    Personally Reviewed  Physical Exam   GEN: No acute distress.   Neck: No JVD Cardiac: RRR, no murmurs, rubs,  or gallops.  Respiratory: Clear to auscultation bilaterally. GI: Soft, nontender, non-distended  MS: No edema; No deformity. Neuro:  Nonfocal  Psych: Normal affect   Labs    High Sensitivity Troponin:   Recent Labs  Lab 11/07/22 1414 11/07/22 1615  TROPONINIHS 19* 17     Chemistry Recent Labs  Lab 11/07/22 1414 11/07/22 1509 11/08/22 0413 11/09/22 0233 11/10/22 0547  NA 135   < > 135 134* 136  K 4.5   < > 4.1 3.8 4.6  CL 99  --  101 100 102  CO2 23  --  21* 25 27  GLUCOSE 113*  --  172* 121* 106*  BUN 57*  --  61* 37* 23  CREATININE 2.59*  --  2.08* 1.21 1.17  CALCIUM 9.2  --  9.2 8.4* 8.3*  MG  --   --   --   --  1.3*  PROT 6.1*  --  5.9*  --   --   ALBUMIN 3.5  --  3.2*  --   --   AST 46*  --  57*  --   --   ALT 82*  --  85*  --   --   ALKPHOS 133*  --  134*  --   --   BILITOT 1.2  --  1.4*  --   --   GFRNONAA 25*  --  33* >60 >60  ANIONGAP 13  --  '13 9 7   '$ < > = values in this interval not displayed.    Lipids No results for input(s): "CHOL", "TRIG", "HDL", "LABVLDL", "LDLCALC", "CHOLHDL" in the last 168 hours.  Hematology Recent Labs  Lab 11/07/22 1414 11/07/22 1509 11/08/22 0413 11/09/22 0519  WBC 11.1*  --  12.3* 10.9*  RBC 3.45*  --  3.52* 3.36*  HGB 10.5* 9.9* 10.6* 10.2*  HCT 30.7* 29.0* 31.4* 28.8*  MCV 89.0  --  89.2 85.7  MCH 30.4  --  30.1 30.4  MCHC 34.2  --  33.8 35.4  RDW 14.4  --  14.6 14.1  PLT 148*  --  151 152   Thyroid No results for input(s): "TSH", "FREET4" in the last 168 hours.  BNP Recent Labs  Lab 11/07/22 1414  BNP 70.2    DDimer No results for input(s): "DDIMER" in the last 168 hours.   Radiology    ECHOCARDIOGRAM LIMITED  Result Date: 11/09/2022    ECHOCARDIOGRAM LIMITED REPORT   Patient Name:   Robert Stark Date of Exam: 11/09/2022 Medical Rec #:  [id number]        Height:       65.0 in Accession #:    [id number]       Weight:       146.8 lb Date of Birth:  1946-11-16        BSA:          1.735 m Patient Age:     76 years         BP:           115/86 mmHg Patient Gender: M                HR:           71 bpm. Exam Location:  Inpatient Procedure: Limited Echo, 2D Echo, Color Doppler and Limited Color Doppler Indications:    Pericardial effusion I31.3  History:        Patient has prior history of Echocardiogram examinations, most                 recent 11/08/2022. Pericarditis, Pericardial Disease,                 Arrythmias:Atrial Fibrillation, Signs/Symptoms:Shortness of                 Breath; Risk Factors:Hypertension and Dyslipidemia.  Sonographer:    Greer Pickerel Referring Phys: [phone] MIHAI CROITORU  Sonographer Comments: Image acquisition challenging due to respiratory motion. IMPRESSIONS  1. Left ventricular ejection fraction, by estimation, is 40 to 45%. The left ventricle has mildly decreased function. The left ventricle has no regional wall motion abnormalities.  2. Right ventricular systolic function is mildly reduced. The right ventricular size is normal.  3. No RA or RV collapse. There is no respirophasic of mitral valve inflow velocities. 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 Large pericardial effusion. The pericardial effusion is surrounding the apex and circumferential. There is no evidence of cardiac tamponade.  4. The mitral valve is normal in structure. No evidence of mitral valve regurgitation. No evidence of mitral stenosis.  5. The aortic valve is tricuspid. Aortic valve regurgitation is not visualized. No aortic  stenosis is present.  6. The inferior vena cava is dilated in size with <50% respiratory variability, suggesting right atrial pressure of 15 mmHg. Comparison(s): Prior images reviewed side by side. LV is less vigorous than prior. FINDINGS  Left Ventricle: Left ventricular ejection fraction, by estimation, is 40 to 45%. The left ventricle has mildly decreased function. The left ventricle has no regional wall motion abnormalities. The left ventricular internal cavity size was normal in size. There is no left  ventricular hypertrophy. Right Ventricle: The right ventricular size is normal. No increase in right ventricular wall thickness. Right ventricular systolic function is mildly reduced. Left Atrium: Left atrial size was normal in size. Right Atrium: Right atrial size was normal in size. Pericardium: No RA or RV collapse. There is no respirophasic of mitral valve inflow velocities. 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 A large pericardial effusion is present. The pericardial effusion is surrounding the apex and circumferential. There is no evidence of cardiac tamponade. Mitral Valve: The mitral valve is normal in structure. No evidence of mitral valve stenosis. Tricuspid Valve: The tricuspid valve is normal in structure. Tricuspid valve regurgitation is not demonstrated. No evidence of tricuspid stenosis. Aortic Valve: The aortic valve is tricuspid. Aortic valve regurgitation is not visualized. No aortic stenosis is present. Pulmonic Valve: The pulmonic valve was normal in structure. Pulmonic valve regurgitation is not visualized. No evidence of pulmonic stenosis. Aorta: The aortic root is normal in size and structure. Venous: The inferior vena cava is dilated in size with less than 50% respiratory variability, suggesting right atrial pressure of 15 mmHg. IAS/Shunts: No atrial level shunt detected by color flow Doppler. LEFT VENTRICLE PLAX 2D LVIDd:         4.40 cm LVIDs:         3.50 cm LV PW:         0.70 cm LV IVS:        0.70 cm  RIGHT VENTRICLE RV S prime:     8.70 cm/s TAPSE (M-mode): 1.0 cm LEFT ATRIUM         Index LA diam:    2.70 cm 1.56 cm/m Rudean Haskell MD Electronically signed by Rudean Haskell MD Signature Date/Time: 11/09/2022/10:55:29 AM    Final    DG CHEST PORT 1 VIEW  Result Date: 11/09/2022 CLINICAL DATA:  Pericardial effusion, weakness, shortness of breath, post pericardiocentesis on 11/08/2022 EXAM: PORTABLE CHEST 1 VIEW COMPARISON:  Portable exam 0703 hours compared to 11/07/2022 FINDINGS:  Enlargement of cardiac silhouette unchanged since 11/07/2022. Mediastinal contours and pulmonary vascularity normal. LEFT pleural effusion and basilar atelectasis. Upper lungs clear. No infiltrate or pneumothorax. IMPRESSION: Persistent enlargement of cardiac silhouette despite interval pericardiocentesis, question recurrent pericardial effusion. Electronically Signed   By: Lavonia Dana M.D.   On: 11/09/2022 08:43   ECHOCARDIOGRAM LIMITED  Result Date: 11/08/2022    ECHOCARDIOGRAM LIMITED REPORT   Patient Name:   Robert Stark Date of Exam: 11/08/2022 Medical Rec #:  [id number]        Height:       65.0 in Accession #:    [id number]       Weight:       140.0 lb Date of Birth:  09-Mar-1946        BSA:          1.700 m Patient Age:    76 years         BP:           100/82 mmHg Patient Gender: M  HR:           100 bpm. Exam Location:  Inpatient Procedure: Limited Echo Indications:    Pericardial effusion I31.3  History:        Patient has prior history of Echocardiogram examinations, most                 recent 11/07/2022. Risk Factors:Hypertension. Acute                 pericarditis. 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 Acute                 transaminitis. Chronic normocytic anemia.  Sonographer:    Darlina Sicilian RDCS Referring Phys: [place]  1. Large pericardial effusion. The pericardial effusion is surrounding the apex. There RV does not fully expand. From 8:10 AM to 8:32 AM (last image) improvedment in size of effusion. Still severe effusion at last image. Comparison(s): Prior images reviewed side by side. Effusion size has improved. FINDINGS  Pericardium: A large pericardial effusion is present. The pericardial effusion is surrounding the apex. There is diastolic collapse of the right ventricular free wall. 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  Continue colchicine Pericardial window planned for Friday Continue colchicine for at least the next 6-8 weeks.    #Persistent Atrial Fibrillation Back in sinus. Continue amiodarone infusion for now.    Hold anticoagulation for now given the hemorrhagic nature of his pericardial effusion and pericarditis. This will be started at some point in the outpatient setting.     For questions or updates, please contact [place] Please consult [email or web address] for contact info under        Signed, [name], MD  11/10/2022, 8:07 AM

## 2022-11-11 ENCOUNTER — Encounter (HOSPITAL_COMMUNITY): Payer: Self-pay | Admitting: Interventional Cardiology

## 2022-11-11 ENCOUNTER — Inpatient Hospital Stay (HOSPITAL_COMMUNITY): Payer: PPO

## 2022-11-11 ENCOUNTER — Other Ambulatory Visit: Payer: Self-pay

## 2022-11-11 ENCOUNTER — Inpatient Hospital Stay (HOSPITAL_COMMUNITY): Payer: PPO | Admitting: Certified Registered Nurse Anesthetist

## 2022-11-11 ENCOUNTER — Encounter (HOSPITAL_COMMUNITY)
Admission: EM | Disposition: A | Discharge status: Home / Self Care | Payer: Self-pay | Source: Home / Self Care | Attending: Cardiovascular Disease

## 2022-11-11 DIAGNOSIS — I3139 Other pericardial effusion (noninflammatory): Secondary | ICD-10-CM

## 2022-11-11 DIAGNOSIS — I10 Essential (primary) hypertension: Secondary | ICD-10-CM

## 2022-11-11 DIAGNOSIS — I4819 Other persistent atrial fibrillation: Secondary | ICD-10-CM | POA: Diagnosis not present

## 2022-11-11 DIAGNOSIS — Z87891 Personal history of nicotine dependence: Secondary | ICD-10-CM

## 2022-11-11 HISTORY — PX: SUBXYPHOID PERICARDIAL WINDOW: SHX5075

## 2022-11-11 HISTORY — PX: TEE WITHOUT CARDIOVERSION: SHX5443

## 2022-11-11 LAB — BASIC METABOLIC PANEL
Anion gap: 11 (ref 5–15)
BUN: 20 mg/dL (ref 8–23)
CO2: 28 mmol/L (ref 22–32)
Calcium: 8.8 mg/dL — ABNORMAL LOW (ref 8.9–10.3)
Chloride: 98 mmol/L (ref 98–111)
Creatinine, Ser: 1.19 mg/dL (ref 0.61–1.24)
GFR, Estimated: >60 mL/min (ref >60)
Glucose, Bld: 114 mg/dL — ABNORMAL HIGH (ref 70–99)
Potassium: 4.9 mmol/L (ref 3.5–5.1)
Sodium: 137 mmol/L (ref 135–145)

## 2022-11-11 LAB — CBC
HCT: 28.1 % — ABNORMAL LOW (ref 39.0–52.0)
Hemoglobin: 9.7 g/dL — ABNORMAL LOW (ref 13.0–17.0)
MCH: 30.1 pg (ref 26.0–34.0)
MCHC: 34.5 g/dL (ref 30.0–36.0)
MCV: 87.3 fL (ref 80.0–100.0)
Platelets: 140 10*3/uL — ABNORMAL LOW (ref 150–400)
RBC: 3.22 MIL/uL — ABNORMAL LOW (ref 4.22–5.81)
RDW: 14.4 % (ref 11.5–15.5)
WBC: 12.2 10*3/uL — ABNORMAL HIGH (ref 4.0–10.5)
nRBC: 0 % (ref 0.0–0.2)

## 2022-11-11 LAB — ECHO INTRAOPERATIVE TEE
Height: 65 in
Weight: 2197.55 oz

## 2022-11-11 LAB — CYTOLOGY - NON PAP

## 2022-11-11 LAB — GLUCOSE, CAPILLARY
Glucose-Capillary: 168 mg/dL — ABNORMAL HIGH (ref 70–99)
Glucose-Capillary: 229 mg/dL — ABNORMAL HIGH (ref 70–99)

## 2022-11-11 LAB — MAGNESIUM: Magnesium: 1.8 mg/dL (ref 1.7–2.4)

## 2022-11-11 SURGERY — CREATION, PERICARDIAL WINDOW, SUBXIPHOID APPROACH
Anesthesia: General

## 2022-11-11 MED ORDER — LIDOCAINE 2% (20 MG/ML) 5 ML SYRINGE
INTRAMUSCULAR | Status: AC
  Filled 2022-11-11: qty 5

## 2022-11-11 MED ORDER — ONDANSETRON HCL 4 MG/2ML IJ SOLN
INTRAMUSCULAR | Status: DC | PRN
  Administered 2022-11-11: 4 mg via INTRAVENOUS

## 2022-11-11 MED ORDER — CHLORHEXIDINE GLUCONATE 0.12 % MT SOLN
OROMUCOSAL | Status: AC
  Filled 2022-11-11: qty 15

## 2022-11-11 MED ORDER — ONDANSETRON HCL 4 MG/2ML IJ SOLN
INTRAMUSCULAR | Status: AC
  Filled 2022-11-11: qty 2

## 2022-11-11 MED ORDER — MIDAZOLAM HCL 2 MG/2ML IJ SOLN
INTRAMUSCULAR | Status: AC
  Filled 2022-11-11: qty 2

## 2022-11-11 MED ORDER — AMIODARONE HCL IN DEXTROSE 360-4.14 MG/200ML-% IV SOLN
60.0000 mg/h | INTRAVENOUS | Status: DC
  Administered 2022-11-11 (×2): 60 mg/h via INTRAVENOUS
  Filled 2022-11-11: qty 400

## 2022-11-11 MED ORDER — BISACODYL 5 MG PO TBEC
10.0000 mg | DELAYED_RELEASE_TABLET | Freq: Every day | ORAL | Status: DC
  Administered 2022-11-11 – 2022-11-13 (×3): 10 mg via ORAL
  Filled 2022-11-11 (×3): qty 2

## 2022-11-11 MED ORDER — CEFAZOLIN SODIUM 1 G IJ SOLR
INTRAMUSCULAR | Status: AC
  Filled 2022-11-11: qty 20

## 2022-11-11 MED ORDER — PHENYLEPHRINE 80 MCG/ML (10ML) SYRINGE FOR IV PUSH (FOR BLOOD PRESSURE SUPPORT)
PREFILLED_SYRINGE | INTRAVENOUS | Status: DC | PRN
  Administered 2022-11-11 (×2): 160 ug via INTRAVENOUS

## 2022-11-11 MED ORDER — CEFAZOLIN SODIUM-DEXTROSE 2-3 GM-%(50ML) IV SOLR
INTRAVENOUS | Status: DC | PRN
  Administered 2022-11-11: 2 g via INTRAVENOUS

## 2022-11-11 MED ORDER — FENTANYL CITRATE (PF) 250 MCG/5ML IJ SOLN
INTRAMUSCULAR | Status: DC | PRN
  Administered 2022-11-11 (×2): 50 ug via INTRAVENOUS

## 2022-11-11 MED ORDER — LACTATED RINGERS IV SOLN: INTRAVENOUS | Status: DC | PRN

## 2022-11-11 MED ORDER — ACETAMINOPHEN 160 MG/5ML PO SOLN
1000.0000 mg | Freq: Four times a day (QID) | ORAL | Status: DC
  Filled 2022-11-11: qty 40.6

## 2022-11-11 MED ORDER — PHENYLEPHRINE 80 MCG/ML (10ML) SYRINGE FOR IV PUSH (FOR BLOOD PRESSURE SUPPORT)
PREFILLED_SYRINGE | INTRAVENOUS | Status: AC
  Filled 2022-11-11: qty 10

## 2022-11-11 MED ORDER — DEXAMETHASONE SODIUM PHOSPHATE 10 MG/ML IJ SOLN
INTRAMUSCULAR | Status: AC
  Filled 2022-11-11: qty 2

## 2022-11-11 MED ORDER — FENTANYL CITRATE (PF) 250 MCG/5ML IJ SOLN
INTRAMUSCULAR | Status: AC
  Filled 2022-11-11: qty 5

## 2022-11-11 MED ORDER — PROPOFOL 10 MG/ML IV BOLUS
INTRAVENOUS | Status: DC | PRN
  Administered 2022-11-11: 100 mg via INTRAVENOUS

## 2022-11-11 MED ORDER — SUGAMMADEX SODIUM 200 MG/2ML IV SOLN
INTRAVENOUS | Status: DC | PRN
  Administered 2022-11-11: 300 mg via INTRAVENOUS

## 2022-11-11 MED ORDER — ROCURONIUM BROMIDE 10 MG/ML (PF) SYRINGE
PREFILLED_SYRINGE | INTRAVENOUS | Status: AC
  Filled 2022-11-11: qty 10

## 2022-11-11 MED ORDER — PROPOFOL 10 MG/ML IV BOLUS
INTRAVENOUS | Status: AC
  Filled 2022-11-11: qty 20

## 2022-11-11 MED ORDER — 0.9 % SODIUM CHLORIDE (POUR BTL) OPTIME
TOPICAL | Status: DC | PRN
  Administered 2022-11-11: 2000 mL

## 2022-11-11 MED ORDER — FENTANYL CITRATE PF 50 MCG/ML IJ SOSY: 25.0000 ug | PREFILLED_SYRINGE | INTRAMUSCULAR | Status: DC | PRN

## 2022-11-11 MED ORDER — VASOPRESSIN 20 UNIT/ML IV SOLN
INTRAVENOUS | Status: AC
  Filled 2022-11-11: qty 1

## 2022-11-11 MED ORDER — CEFAZOLIN SODIUM-DEXTROSE 2-4 GM/100ML-% IV SOLN
2.0000 g | Freq: Three times a day (TID) | INTRAVENOUS | Status: AC
  Administered 2022-11-11 (×2): 2 g via INTRAVENOUS
  Filled 2022-11-11 (×2): qty 100

## 2022-11-11 MED ORDER — AMIODARONE HCL IN DEXTROSE 360-4.14 MG/200ML-% IV SOLN
30.0000 mg/h | INTRAVENOUS | Status: DC
  Administered 2022-11-11 – 2022-11-12 (×2): 30 mg/h via INTRAVENOUS
  Filled 2022-11-11 (×2): qty 200

## 2022-11-11 MED ORDER — ROCURONIUM BROMIDE 10 MG/ML (PF) SYRINGE
PREFILLED_SYRINGE | INTRAVENOUS | Status: DC | PRN
  Administered 2022-11-11: 80 mg via INTRAVENOUS

## 2022-11-11 MED ORDER — AMIODARONE LOAD VIA INFUSION
150.0000 mg | Freq: Once | INTRAVENOUS | Status: AC
  Administered 2022-11-11: 150 mg via INTRAVENOUS
  Filled 2022-11-11: qty 83.34

## 2022-11-11 MED ORDER — SENNOSIDES-DOCUSATE SODIUM 8.6-50 MG PO TABS
1.0000 | ORAL_TABLET | Freq: Every day | ORAL | Status: DC
  Administered 2022-11-11 – 2022-11-12 (×2): 1 via ORAL
  Filled 2022-11-11 (×2): qty 1

## 2022-11-11 MED ORDER — DEXAMETHASONE SODIUM PHOSPHATE 10 MG/ML IJ SOLN
INTRAMUSCULAR | Status: DC | PRN
  Administered 2022-11-11: 8 mg via INTRAVENOUS

## 2022-11-11 MED ORDER — MIDAZOLAM HCL 2 MG/2ML IJ SOLN
INTRAMUSCULAR | Status: DC | PRN
  Administered 2022-11-11: 2 mg via INTRAVENOUS

## 2022-11-11 MED ORDER — MORPHINE SULFATE (PF) 2 MG/ML IV SOLN
2.0000 mg | INTRAVENOUS | Status: DC | PRN
  Administered 2022-11-11 – 2022-11-12 (×2): 2 mg via INTRAVENOUS
  Filled 2022-11-11: qty 1

## 2022-11-11 MED ORDER — ACETAMINOPHEN 500 MG PO TABS
1000.0000 mg | ORAL_TABLET | Freq: Four times a day (QID) | ORAL | Status: DC
  Administered 2022-11-11 – 2022-11-13 (×9): 1000 mg via ORAL
  Filled 2022-11-11 (×8): qty 2

## 2022-11-11 MED ORDER — LIDOCAINE 2% (20 MG/ML) 5 ML SYRINGE
INTRAMUSCULAR | Status: DC | PRN
  Administered 2022-11-11: 50 mg via INTRAVENOUS

## 2022-11-11 MED ORDER — PHENYLEPHRINE HCL-NACL 20-0.9 MG/250ML-% IV SOLN
INTRAVENOUS | Status: DC | PRN
  Administered 2022-11-11: 50 ug/min via INTRAVENOUS

## 2022-11-11 SURGICAL SUPPLY — 57 items
BAG DECANTER FOR FLEXI CONT (MISCELLANEOUS) ×1 IMPLANT
BLADE CLIPPER SURG (BLADE) ×1 IMPLANT
BLADE STERNUM SYSTEM 6 (BLADE) ×1 IMPLANT
CANISTER SUCT 3000ML PPV (MISCELLANEOUS) ×1 IMPLANT
CLIP TI MEDIUM 6 (CLIP) IMPLANT
CLIP TI WIDE RED SMALL 6 (CLIP) IMPLANT
CNTNR URN SCR LID CUP LEK RST (MISCELLANEOUS) ×1 IMPLANT
CONT SPEC 4OZ STRL OR WHT (MISCELLANEOUS) ×2
DERMABOND ADVANCED .7 DNX12 (GAUZE/BANDAGES/DRESSINGS) IMPLANT
DRAIN CHANNEL 19F RND (DRAIN) IMPLANT
DRAIN CHANNEL 24FR (DRAIN) IMPLANT
DRAIN CHANNEL 28F RND 3/8 FF (WOUND CARE) IMPLANT
DRAPE INCISE IOBAN 66X45 STRL (DRAPES) ×1 IMPLANT
ELECT BLADE 4.0 EZ CLEAN MEGAD (MISCELLANEOUS) ×2
ELECT CAUTERY BLADE 6.4 (BLADE) ×1 IMPLANT
ELECT REM PT RETURN 9FT ADLT (ELECTROSURGICAL) ×2
ELECTRODE BLDE 4.0 EZ CLN MEGD (MISCELLANEOUS) ×1 IMPLANT
ELECTRODE REM PT RTRN 9FT ADLT (ELECTROSURGICAL) ×2 IMPLANT
FELT TEFLON 1X6 (MISCELLANEOUS) ×1 IMPLANT
GAUZE SPONGE 4X4 12PLY STRL (GAUZE/BANDAGES/DRESSINGS) ×2 IMPLANT
GLOVE BIOGEL PI IND STRL 7.0 (GLOVE) IMPLANT
GLOVE BIOGEL PI IND STRL 7.5 (GLOVE) IMPLANT
GLOVE ECLIPSE 7.5 STRL STRAW (GLOVE) ×2 IMPLANT
GLOVE SURG SS PI 7.0 STRL IVOR (GLOVE) IMPLANT
GOWN STRL REUS W/ TWL LRG LVL3 (GOWN DISPOSABLE) ×4 IMPLANT
GOWN STRL REUS W/TWL LRG LVL3 (GOWN DISPOSABLE) ×4
HANDLE SUCTION POOLE (INSTRUMENTS) IMPLANT
KIT BASIN OR (CUSTOM PROCEDURE TRAY) ×1 IMPLANT
KIT TURNOVER KIT B (KITS) ×1 IMPLANT
NS IRRIG 1000ML POUR BTL (IV SOLUTION) ×3 IMPLANT
PACK CHEST (CUSTOM PROCEDURE TRAY) ×1 IMPLANT
PAD ARMBOARD 7.5X6 YLW CONV (MISCELLANEOUS) ×2 IMPLANT
PAD ELECT DEFIB RADIOL ZOLL (MISCELLANEOUS) ×1 IMPLANT
PENCIL BUTTON HOLSTER BLD 10FT (ELECTRODE) ×1 IMPLANT
POSITIONER HEAD DONUT 9IN (MISCELLANEOUS) ×1 IMPLANT
SUCTION POOLE HANDLE (INSTRUMENTS) ×1
SUPPORT HEART JANKE-BARRON (MISCELLANEOUS) ×1 IMPLANT
SUT MNCRL AB 4-0 PS2 18 (SUTURE) ×2 IMPLANT
SUT PROLENE 4 0 RB 1 (SUTURE) ×1
SUT PROLENE 4-0 RB1 .5 CRCL 36 (SUTURE) IMPLANT
SUT SILK 1 TIES 10X30 (SUTURE) IMPLANT
SUT SILK 2 0 SH (SUTURE) IMPLANT
SUT VIC AB 0 CTX 36 (SUTURE) ×2
SUT VIC AB 0 CTX36XBRD ANTBCTR (SUTURE) ×2 IMPLANT
SUT VIC AB 2-0 CT1 27 (SUTURE) ×3
SUT VIC AB 2-0 CT1 TAPERPNT 27 (SUTURE) ×2 IMPLANT
SUT VIC AB 3-0 SH 27 (SUTURE) ×1
SUT VIC AB 3-0 SH 27X BRD (SUTURE) IMPLANT
SUT VIC AB 3-0 X1 27 (SUTURE) IMPLANT
SYSTEM SAHARA CHEST DRAIN ATS (WOUND CARE) ×1 IMPLANT
TAPE CLOTH SURG 4X10 WHT LF (GAUZE/BANDAGES/DRESSINGS) IMPLANT
TAPE PAPER 2X10 WHT MICROPORE (GAUZE/BANDAGES/DRESSINGS) IMPLANT
TOWEL GREEN STERILE (TOWEL DISPOSABLE) ×1 IMPLANT
TOWEL GREEN STERILE FF (TOWEL DISPOSABLE) ×1 IMPLANT
TRAP SPECIMEN MUCUS 40CC (MISCELLANEOUS) IMPLANT
TRAY FOLEY SLVR 16FR TEMP STAT (SET/KITS/TRAYS/PACK) ×1 IMPLANT
WATER STERILE IRR 1000ML POUR (IV SOLUTION) ×2 IMPLANT

## 2022-11-11 NOTE — Anesthesia Postprocedure Evaluation (Signed)
Anesthesia Post Note  Patient: Robert Stark  Procedure(s) Performed: SUBXYPHOID PERICARDIAL WINDOW TRANSESOPHAGEAL ECHOCARDIOGRAM (TEE)     Patient location during evaluation: PACU Anesthesia Type: General Level of consciousness: awake and alert Pain management: pain level controlled Vital Signs Assessment: post-procedure vital signs reviewed and stable Respiratory status: spontaneous breathing, nonlabored ventilation, respiratory function stable and patient connected to nasal cannula oxygen Cardiovascular status: blood pressure returned to baseline and stable Postop Assessment: no apparent nausea or vomiting Anesthetic complications: no   No notable events documented.  Last Vitals:  Vitals:   11/11/22 0930 11/11/22 1000  BP: 118/72 111/89  Pulse: (!) 123 (!) 119  Resp: 20 16  Temp: 36.6 C   SpO2: 92% 93%    Last Pain:  Vitals:   11/11/22 1000  TempSrc:   PainSc: [place]

## 2022-11-11 NOTE — Transfer of Care (Signed)
Immediate Anesthesia Transfer of Care Note  Patient: [name]  Procedure(s) Performed: SUBXYPHOID PERICARDIAL WINDOW TRANSESOPHAGEAL ECHOCARDIOGRAM (TEE)  Patient Location: PACU  Anesthesia Type:General  Level of Consciousness: awake and alert   Airway & Oxygen Therapy: Patient Spontanous Breathing and Patient connected to face mask oxygen  Post-op Assessment: Report given to RN, Post -op Vital signs reviewed and stable, and Patient moving all extremities X 4  Post vital signs: Reviewed and stable  Last Vitals:  Vitals Value Taken Time  BP 117/82 11/11/22 0900  Temp 36.1 C 11/11/22 0855  Pulse 114 11/11/22 0907  Resp 13 11/11/22 0907  SpO2 91 % 11/11/22 0907  Vitals shown include unvalidated device data.  Last Pain:  Vitals:   11/11/22 0400  TempSrc:   PainSc: 0-No pain      Patients Stated Pain Goal: 0 (37/35/78 9784)  Complications: No notable events documented.

## 2022-11-11 NOTE — Progress Notes (Signed)
Received report from [employer].  Patient already in the OR.  Will complete assessment upon return to 2H01.

## 2022-11-11 NOTE — Op Note (Signed)
      [address]       ,[place] [id number]             [phone]                                          11/11/2022 Patient:  Robert Stark Pre-Op Dx: Pericardial Effusion   Post-op Dx:  Same Procedure: Subxyphoid pericardial effusion  Surgeon and Role:      [name], MD- Primary Anesthesia  general EBL:  minimal  Blood Administration: none  Drains: 43 F blake drain to pericardial space Wires:  Counts: correct   Indications: 76 yo wm with large pericardial effusion following ablation procedure not able to be evacuated with pericardiocentesis Findings: There was a large circumferential pericardial effusion by TEE that was completely evacuated following drainage. Evidence of pericarditis on direct visual exam. Left pleural effusion noted on TEE  Procedure  Patient was brought to the operating room theater and placed on the table in the supine position.  After general anesthesia was obtained with use of an endotracheal tube the chest was prepped and draped in the usual sterile fashion.  TEE was performed and findings noted above.  A small subxiphoid incision was then created and carried down to the xiphoid process.  Xiphoid process was resected.  This was then continued down to the pericardium which was incised sharply and evidence of thickened epicardium consistent with pericarditis was noted.  Large amount of serosanguineous fluid was then noted and utilizing the suction catheter the inferior aspect of the heart was mobilized and all the fluid removed.  Through a separate stab incision a 19 [place] Blake drain was then advanced into the pericardial space after confirmation by TEE of complete evacuation of the effusion.  The drain was secured and the wound was then closed in multiple layers observable suture.  Sterile dressings were applied patient tolerated the procedure well and was taken to the recovery room in good condition.    [name] MD

## 2022-11-11 NOTE — Progress Notes (Signed)
Rounding Note    Patient Name: Robert Stark Date of Encounter: 11/11/2022  [place] Cardiologist: Dr. Sallyanne Kuster  Subjective   Just back from OR, uncomfortable w/some post-op pain,  but in no distress  Inpatient Medications    Scheduled Meds:  amiodarone  400 mg Oral Daily   atorvastatin  40 mg Oral Daily   azelastine  1 spray Each Nare BID   B-complex with vitamin C  1 tablet Oral Daily   chlorhexidine       Chlorhexidine Gluconate Cloth  6 each Topical Daily   colchicine  0.3 mg Oral Daily   heparin  5,000 Units Subcutaneous Q8H   influenza vaccine adjuvanted  0.5 mL Intramuscular Tomorrow-1000   pantoprazole  40 mg Oral Daily   sodium chloride flush  3 mL Intravenous Q12H   tamsulosin  0.4 mg Oral QPC supper   Continuous Infusions:  sodium chloride Stopped (11/10/22 1315)   PRN Meds: sodium chloride, acetaminophen, benzonatate, chlorhexidine, fentaNYL (SUBLIMAZE) injection, melatonin, metoprolol tartrate, morphine injection, ondansetron (ZOFRAN) IV, oxyCODONE-acetaminophen   Vital Signs    Vitals:   11/11/22 0855 11/11/22 0900 11/11/22 0915 11/11/22 0930  BP: 126/84 117/82 111/82 118/72  Pulse: 72 (!) 118 (!) 115 (!) 123  Resp: '12 12 16 20  '$ Temp: (!) 97 F (36.1 C)   97.8 F (36.6 C)  TempSrc:      SpO2: 92% 96% 91% 92%  Weight:      Height:        Intake/Output Summary (Last 24 hours) at 11/11/2022 0958 Last data filed at 11/11/2022 7616 Gross per 24 hour  Intake 1701.38 ml  Output 1725 ml  Net -23.62 ml      11/11/2022    5:00 AM 11/10/2022    5:00 AM 11/09/2022    5:00 AM  Last 3 Weights  Weight (lbs) 137 lb 5.6 oz 140 lb 10.5 oz 146 lb 13.2 oz  Weight (kg) 62.3 kg 63.8 kg 66.6 kg      Telemetry    SR 70's-80s PAfib > post op looks an Aflutter - Personally Reviewed  ECG    No new EKGs - Personally Reviewed  Physical Exam   GEN: No acute distress.   Neck: No JVD Cardiac: RRR, tachycardic Drain in place, dressing  CDI Respiratory: CTA b/l. GI: Soft, nontender, non-distended  MS: No edema; No deformity. Neuro:  Nonfocal  Psych: Normal affect   Labs    High Sensitivity Troponin:   Recent Labs  Lab 11/07/22 1414 11/07/22 1615  TROPONINIHS 19* 17     Chemistry Recent Labs  Lab 11/07/22 1414 11/07/22 1509 11/08/22 0413 11/09/22 0233 11/10/22 0547 11/11/22 0330  NA 135   < > 135 134* 136 137  K 4.5   < > 4.1 3.8 4.6 4.9  CL 99  --  101 100 102 98  CO2 23  --  21* '25 27 28  '$ GLUCOSE 113*  --  172* 121* 106* 114*  BUN 57*  --  61* 37* 23 20  CREATININE 2.59*  --  2.08* 1.21 1.17 1.19  CALCIUM 9.2  --  9.2 8.4* 8.3* 8.8*  MG  --   --   --   --  1.3* 1.8  PROT 6.1*  --  5.9*  --   --   --   ALBUMIN 3.5  --  3.2*  --   --   --   AST 46*  --  57*  --   --   --  ALT 82*  --  85*  --   --   --   ALKPHOS 133*  --  134*  --   --   --   BILITOT 1.2  --  1.4*  --   --   --   GFRNONAA 25*  --  33* >60 >60 >60  ANIONGAP 13  --  '13 9 7 11   '$ < > = values in this interval not displayed.    Lipids No results for input(s): "CHOL", "TRIG", "HDL", "LABVLDL", "LDLCALC", "CHOLHDL" in the last 168 hours.  Hematology Recent Labs  Lab 11/08/22 0413 11/09/22 0519 11/11/22 0330  WBC 12.3* 10.9* 12.2*  RBC 3.52* 3.36* 3.22*  HGB 10.6* 10.2* 9.7*  HCT 31.4* 28.8* 28.1*  MCV 89.2 85.7 87.3  MCH 30.1 30.4 30.1  MCHC 33.8 35.4 34.5  RDW 14.6 14.1 14.4  PLT 151 152 140*   Thyroid No results for input(s): "TSH", "FREET4" in the last 168 hours.  BNP Recent Labs  Lab 11/07/22 1414  BNP 70.2    DDimer No results for input(s): "DDIMER" in the last 168 hours.   Radiology     Cardiac Studies   11/09/22: TTE 1. Left ventricular ejection fraction, by estimation, is 40 to 45%. The  left ventricle has mildly decreased function. The left ventricle has no  regional wall motion abnormalities.   2. Right ventricular systolic function is mildly reduced. The right  ventricular size is normal.   3. No  RA or RV collapse. There is no respirophasic of mitral valve inflow  velocities. 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 Large pericardial effusion. The  pericardial effusion is surrounding the apex and circumferential. There is  no evidence of cardiac tamponade.   4. The mitral valve is normal in structure. No evidence of mitral valve  regurgitation. No evidence of mitral stenosis.   5. The aortic valve is tricuspid. Aortic valve regurgitation is not  visualized. No aortic stenosis is present.   6. The inferior vena cava is dilated in size with <50% respiratory  variability, suggesting right atrial pressure of 15 mmHg.   Comparison(s): Prior images reviewed side by side. LV is less vigorous  than prior.   11/08/22: limited echo 1. Large pericardial effusion. The pericardial effusion is surrounding  the apex. There RV does not fully expand. From 8:10 AM to 8:32 AM (last  image) improvedment in size of effusion. Still severe effusion at last  image.  Comparison(s): Prior images reviewed side by side. Effusion size has  improved.   FINDINGS   Pericardium: A large pericardial effusion is present. The pericardial  effusion is surrounding the apex. There is diastolic collapse of the right  ventricular free wall.    11/07/22: TTE 1. Left ventricular ejection fraction, by estimation, is 35 to 40%. The  left ventricle has moderately decreased function. The left ventricle  demonstrates global hypokinesis. Left ventricular diastolic parameters are  indeterminate.   2. Right ventricular systolic function is normal. The right ventricular  size is normal. Tricuspid regurgitation signal is inadequate for assessing  PA pressure.   3. Left atrial size was mildly dilated.   4. Large pericardial effusion. The pericardial effusion is  circumferential. Findings are consistent with cardiac tamponade.   5. The mitral valve is normal in structure. Trivial mitral valve  regurgitation. No evidence of mitral stenosis.    6. The aortic valve is normal in structure. Aortic valve regurgitation is  not visualized. No aortic stenosis is present.  7. There is borderline dilatation of the aortic root, measuring 39 mm.   8. The inferior vena cava is dilated in size with <50% respiratory  variability, suggesting right atrial pressure of 15 mmHg.   Comparison(s): A prior study was performed on 12/07/2021. The left  ventricular function is unchanged. The previous study showed a small  pericardial effusion anterior to the right ventricle. The effusion is now  large with findings of tamponade.    10/04/22 EPS/ablation CONCLUSIONS: 1. Successful PVI 2. Successful ablation/isolation of the posterior wall 3. Intracardiac echo reveals trivial pericardial effusion, left sided common PV ostium, large mobile eustachian valve. 4. No early apparent complications. 5. Protonix '40mg'$  PO daily x 45 days 6. Colchicine 0.'6mg'$  PO BID x 5 days   (NOTE: Due to the large mobile eustachian valve,  elected not to ablate the CTI).    06/29/22: TEE 1. Left ventricular ejection fraction, by estimation, is 25 to 30%. The  left ventricle has severely decreased function.   2. Right ventricular systolic function is mildly reduced. The right  ventricular size is normal.   3. Left atrial size was moderately dilated. No left atrial/left atrial  appendage thrombus was detected.   4. Right atrial size was severely dilated.   5. The mitral valve is normal in structure. Trivial mitral valve  regurgitation.   6. The aortic valve is tricuspid. Aortic valve regurgitation is trivial.   7. There is mild (Grade II) plaque.   8. Agitated saline contrast bubble study was negative, with no evidence  of any interatrial shunt.    Patient Profile     76 y.o. 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 Eda Paschal team managing    Persistent AFib CHA2DS2Vasc is 3, on Eliquis out pt Held 2/2 above PAFib, appears to be in an Aflutter post op restart amiodarone gtt     3. AKI Remained improved    For questions or updates, please contact [place] Please consult [email or web address] for contact info under        Signed, [place], PA-C  11/11/2022, 9:58 AM

## 2022-11-11 NOTE — Anesthesia Procedure Notes (Signed)
Procedure Name: Intubation Date/Time: 11/11/2022 7:54 AM  Performed by: Inda Coke, CRNAPre-anesthesia Checklist: Patient identified, Emergency Drugs available, Suction available, Timeout performed and Patient being monitored Patient Re-evaluated:Patient Re-evaluated prior to induction Oxygen Delivery Method: Circle system utilized Preoxygenation: Pre-oxygenation with 100% oxygen Induction Type: IV induction Ventilation: Mask ventilation without difficulty Laryngoscope Size: Mac and 4 Grade View: Grade I Tube type: Oral Tube size: 7.5 mm Number of attempts: 1 Airway Equipment and Method: Stylet Placement Confirmation: ETT inserted through vocal cords under direct vision, positive ETCO2, CO2 detector and breath sounds checked- equal and bilateral Secured at: 23 cm Tube secured with: Tape Dental Injury: Teeth and Oropharynx as per pre-operative assessment

## 2022-11-11 NOTE — Interval H&P Note (Signed)
History and Physical Interval Note:  11/11/2022 6:39 AM  Robert Stark  has presented today for surgery, with the diagnosis of PERICARDIAL EFFUSION.  The various methods of treatment have been discussed with the patient and family. After consideration of risks, benefits and other options for treatment, the patient has consented to  Procedure(s): SUBXYPHOID PERICARDIAL WINDOW (N/A) TRANSESOPHAGEAL ECHOCARDIOGRAM (TEE) (N/A) as a surgical intervention.  The patient's history has been reviewed, patient examined, no change in status, stable for surgery.  I have reviewed the patient's chart and labs.  Questions were answered to the patient's satisfaction.     [name]

## 2022-11-11 NOTE — Anesthesia Procedure Notes (Signed)
Arterial Line Insertion Start/End11/24/2023 7:10 AM, 11/11/2022 7:15 AM Performed by: Inda Coke, CRNA, CRNA  Patient location: Pre-op. Preanesthetic checklist: patient identified, IV checked, site marked, risks and benefits discussed, surgical consent, monitors and equipment checked, pre-op evaluation, timeout performed and anesthesia consent Lidocaine 1% used for infiltration and patient sedated Left, radial was placed Catheter size: 20 G Hand hygiene performed  and maximum sterile barriers used  Allen's test indicative of satisfactory collateral circulation Attempts: 1 Procedure performed without using ultrasound guided technique. Following insertion, dressing applied and Biopatch. Post procedure assessment: normal  Patient tolerated the procedure well with no immediate complications.

## 2022-11-11 NOTE — Anesthesia Preprocedure Evaluation (Addendum)
Anesthesia Evaluation  Patient identified by MRN, date of birth, ID band Patient awake    Reviewed: Allergy & Precautions, NPO status , Patient's Chart, lab work & pertinent test results, reviewed documented beta blocker date and time   Airway Mallampati: II  TM Distance: >3 FB Neck ROM: Full    Dental  (+) Teeth Intact, Dental Advisory Given   Pulmonary former smoker   breath sounds clear to auscultation       Cardiovascular hypertension, Pt. on medications and Pt. on home beta blockers  Rhythm:Regular Rate:Normal  Echo: 1. Left ventricular ejection fraction, by estimation, is 40 to 45%. The  left ventricle has mildly decreased function. The left ventricle has no  regional wall motion abnormalities.   2. Right ventricular systolic function is mildly reduced. The right  ventricular size is normal.   3. No RA or RV collapse. There is no respirophasic of mitral valve inflow  velocities. 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 Large pericardial effusion. The  pericardial effusion is surrounding the apex and circumferential. There is  no evidence of cardiac tamponade.   4. The mitral valve is normal in structure. No evidence of mitral valve  regurgitation. No evidence of mitral stenosis.   5. The aortic valve is tricuspid. Aortic valve regurgitation is not  visualized. No aortic stenosis is present.   6. The inferior vena cava is dilated in size with <50% respiratory  variability, suggesting right atrial pressure of 15 mmHg.      Neuro/Psych negative neurological ROS  negative psych ROS   GI/Hepatic Neg liver ROS,GERD  Medicated,,  Endo/Other  negative endocrine ROS    Renal/GU negative Renal ROS     Musculoskeletal negative musculoskeletal ROS (+)    Abdominal   Peds  Hematology negative hematology ROS (+)   Anesthesia Other Findings   Reproductive/Obstetrics                             Anesthesia  Physical Anesthesia Plan  ASA: 4  Anesthesia Plan: General   Post-op Pain Management:    Induction: Intravenous  PONV Risk Score and Plan: 3 and Ondansetron, Dexamethasone and Treatment may vary due to age or medical condition  Airway Management Planned: Oral ETT  Additional Equipment: Arterial line, CVP, TEE and Ultrasound Guidance Line Placement  Intra-op Plan:   Post-operative Plan: Possible Post-op intubation/ventilation  Informed Consent: I have reviewed the patients History and Physical, chart, labs and discussed the procedure including the risks, benefits and alternatives for the proposed anesthesia with the patient or authorized representative who has indicated his/her understanding and acceptance.     Dental advisory given  Plan Discussed with: CRNA  Anesthesia Plan Comments:        Anesthesia Quick Evaluation

## 2022-11-11 NOTE — Anesthesia Procedure Notes (Signed)
Central Venous Catheter Insertion Performed by: Effie Berkshire, MD, anesthesiologist Start/End11/24/2023 7:15 AM, 11/11/2022 7:20 AM Patient location: Pre-op. Preanesthetic checklist: patient identified, IV checked, site marked, risks and benefits discussed, surgical consent, monitors and equipment checked, pre-op evaluation, timeout performed and anesthesia consent Position: Trendelenburg Lidocaine 1% used for infiltration and patient sedated Hand hygiene performed , maximum sterile barriers used  and Seldinger technique used Catheter size: 8.5 Fr Total catheter length 10. Central line was placed.Sheath introducer Swan type:thermodilution PA Cath depth:50 Procedure performed using ultrasound guided technique. Ultrasound Notes:anatomy identified, needle tip was noted to be adjacent to the nerve/plexus identified, no ultrasound evidence of intravascular and/or intraneural injection and image(s) printed for medical record Attempts: 1 Following insertion, line sutured, dressing applied and Biopatch. Post procedure assessment: blood return through all ports, free fluid flow and no air  Patient tolerated the procedure well with no immediate complications.

## 2022-11-12 ENCOUNTER — Inpatient Hospital Stay (HOSPITAL_COMMUNITY): Payer: PPO

## 2022-11-12 ENCOUNTER — Encounter (HOSPITAL_COMMUNITY): Payer: Self-pay | Admitting: Thoracic Surgery (Cardiothoracic Vascular Surgery)

## 2022-11-12 DIAGNOSIS — N179 Acute kidney failure, unspecified: Secondary | ICD-10-CM | POA: Diagnosis not present

## 2022-11-12 DIAGNOSIS — I480 Paroxysmal atrial fibrillation: Secondary | ICD-10-CM | POA: Diagnosis not present

## 2022-11-12 DIAGNOSIS — I3139 Other pericardial effusion (noninflammatory): Secondary | ICD-10-CM | POA: Diagnosis not present

## 2022-11-12 DIAGNOSIS — I5023 Acute on chronic systolic (congestive) heart failure: Secondary | ICD-10-CM | POA: Diagnosis not present

## 2022-11-12 LAB — BASIC METABOLIC PANEL
Anion gap: 12 (ref 5–15)
BUN: 29 mg/dL — ABNORMAL HIGH (ref 8–23)
CO2: 23 mmol/L (ref 22–32)
Calcium: 8.6 mg/dL — ABNORMAL LOW (ref 8.9–10.3)
Chloride: 97 mmol/L — ABNORMAL LOW (ref 98–111)
Creatinine, Ser: 1.5 mg/dL — ABNORMAL HIGH (ref 0.61–1.24)
GFR, Estimated: 48 mL/min — ABNORMAL LOW (ref >60)
Glucose, Bld: 243 mg/dL — ABNORMAL HIGH (ref 70–99)
Potassium: 4.5 mmol/L (ref 3.5–5.1)
Sodium: 132 mmol/L — ABNORMAL LOW (ref 135–145)

## 2022-11-12 LAB — CBC
HCT: 30.9 % — ABNORMAL LOW (ref 39.0–52.0)
Hemoglobin: 10.6 g/dL — ABNORMAL LOW (ref 13.0–17.0)
MCH: 29.6 pg (ref 26.0–34.0)
MCHC: 34.3 g/dL (ref 30.0–36.0)
MCV: 86.3 fL (ref 80.0–100.0)
Platelets: 164 10*3/uL (ref 150–400)
RBC: 3.58 MIL/uL — ABNORMAL LOW (ref 4.22–5.81)
RDW: 14.4 % (ref 11.5–15.5)
WBC: 18.8 10*3/uL — ABNORMAL HIGH (ref 4.0–10.5)
nRBC: 0 % (ref 0.0–0.2)

## 2022-11-12 LAB — GLUCOSE, CAPILLARY: Glucose-Capillary: 148 mg/dL — ABNORMAL HIGH (ref 70–99)

## 2022-11-12 MED ORDER — ROPINIROLE HCL 0.25 MG PO TABS
0.2500 mg | ORAL_TABLET | Freq: Every evening | ORAL | Status: DC | PRN
  Administered 2022-11-13: 0.25 mg via ORAL
  Filled 2022-11-12 (×2): qty 1

## 2022-11-12 MED ORDER — INSULIN ASPART 100 UNIT/ML IJ SOLN: 0.0000 [IU] | Freq: Every day | INTRAMUSCULAR | Status: DC

## 2022-11-12 MED ORDER — INSULIN ASPART 100 UNIT/ML IJ SOLN
0.0000 [IU] | Freq: Three times a day (TID) | INTRAMUSCULAR | Status: DC
  Administered 2022-11-12: 3 [IU] via SUBCUTANEOUS
  Administered 2022-11-13: 1 [IU] via SUBCUTANEOUS

## 2022-11-12 MED ORDER — AMIODARONE HCL 200 MG PO TABS
400.0000 mg | ORAL_TABLET | Freq: Every day | ORAL | Status: DC
  Administered 2022-11-12: 400 mg via ORAL
  Filled 2022-11-12: qty 2

## 2022-11-12 NOTE — Progress Notes (Signed)
Rounding Note    Patient Name: Robert Stark Date of Encounter: 11/12/2022  [place] Cardiologist: None   Subjective   Has very little pain.  Breathing okay.  Had a rash over his right forearm that has resolved. Only 50 mL drainage from pericardial drain.  Plan to be removed today. Small-medium left pleural effusion.  Inpatient Medications    Scheduled Meds:  acetaminophen  1,000 mg Oral Q6H   Or   acetaminophen (TYLENOL) oral liquid 160 mg/5 mL  1,000 mg Oral Q6H   atorvastatin  40 mg Oral Daily   azelastine  1 spray Each Nare BID   B-complex with vitamin C  1 tablet Oral Daily   bisacodyl  10 mg Oral Daily   Chlorhexidine Gluconate Cloth  6 each Topical Daily   colchicine  0.3 mg Oral Daily   heparin  5,000 Units Subcutaneous Q8H   influenza vaccine adjuvanted  0.5 mL Intramuscular Tomorrow-1000   pantoprazole  40 mg Oral Daily   senna-docusate  1 tablet Oral QHS   sodium chloride flush  3 mL Intravenous Q12H   tamsulosin  0.4 mg Oral QPC supper   Continuous Infusions:  sodium chloride 10 mL/hr at 11/12/22 3570   amiodarone 30 mg/hr (11/12/22 0621)   PRN Meds: sodium chloride, benzonatate, fentaNYL (SUBLIMAZE) injection, melatonin, metoprolol tartrate, morphine injection, ondansetron (ZOFRAN) IV, oxyCODONE-acetaminophen   Vital Signs    Vitals:   11/12/22 0400 11/12/22 0500 11/12/22 0600 11/12/22 0730  BP: (!) 109/58 (!) 108/54 115/66   Pulse: (!) 59 (!) 59 61   Resp: '13 16 16   '$ Temp:    97.6 F (36.4 C)  TempSrc:    Oral  SpO2: 94% 93% 94%   Weight:  64.7 kg    Height:        Intake/Output Summary (Last 24 hours) at 11/12/2022 0905 Last data filed at 11/12/2022 1779 Gross per 24 hour  Intake 1277.17 ml  Output 1200 ml  Net 77.17 ml      11/12/2022    5:00 AM 11/11/2022    5:00 AM 11/10/2022    5:00 AM  Last 3 Weights  Weight (lbs) 142 lb 10.2 oz 137 lb 5.6 oz 140 lb 10.5 oz  Weight (kg) 64.7 kg 62.3 kg 63.8 kg       Telemetry    Back in normal sinus rhythm- Personally Reviewed  ECG    No new tracing- Personally Reviewed  Physical Exam  Appears comfortable GEN: No acute distress.   Neck: No JVD Cardiac: RRR, no murmurs, rubs, or gallops.  Respiratory: Clear to auscultation bilaterally.  Diminished left base GI: Soft, nontender, non-distended  MS: No edema; No deformity. Neuro:  Nonfocal  Psych: Normal affect   Labs    High Sensitivity Troponin:   Recent Labs  Lab 11/07/22 1414 11/07/22 1615  TROPONINIHS 19* 17     Chemistry Recent Labs  Lab 11/07/22 1414 11/07/22 1509 11/08/22 0413 11/09/22 0233 11/10/22 0547 11/11/22 0330 11/12/22 0306  NA 135   < > 135   < > 136 137 132*  K 4.5   < > 4.1   < > 4.6 4.9 4.5  CL 99  --  101   < > 102 98 97*  CO2 23  --  21*   < > '27 28 23  '$ GLUCOSE 113*  --  172*   < > 106* 114* 243*  BUN 57*  --  61*   < >  23 20 29*  CREATININE 2.59*  --  2.08*   < > 1.17 1.19 1.50*  CALCIUM 9.2  --  9.2   < > 8.3* 8.8* 8.6*  MG  --   --   --   --  1.3* 1.8  --   PROT 6.1*  --  5.9*  --   --   --   --   ALBUMIN 3.5  --  3.2*  --   --   --   --   AST 46*  --  57*  --   --   --   --   ALT 82*  --  85*  --   --   --   --   ALKPHOS 133*  --  134*  --   --   --   --   BILITOT 1.2  --  1.4*  --   --   --   --   GFRNONAA 25*  --  33*   < > >60 >60 48*  ANIONGAP 13  --  13   < > '7 11 12   '$ < > = values in this interval not displayed.    Lipids No results for input(s): "CHOL", "TRIG", "HDL", "LABVLDL", "LDLCALC", "CHOLHDL" in the last 168 hours.  Hematology Recent Labs  Lab 11/09/22 0519 11/11/22 0330 11/12/22 0306  WBC 10.9* 12.2* 18.8*  RBC 3.36* 3.22* 3.58*  HGB 10.2* 9.7* 10.6*  HCT 28.8* 28.1* 30.9*  MCV 85.7 87.3 86.3  MCH 30.4 30.1 29.6  MCHC 35.4 34.5 34.3  RDW 14.1 14.4 14.4  PLT 152 140* 164   Thyroid No results for input(s): "TSH", "FREET4" in the last 168 hours.  BNP Recent Labs  Lab 11/07/22 1414  BNP 70.2    DDimer No results  for input(s): "DDIMER" in the last 168 hours.   Radiology    DG Chest Port 1 View  Result Date: 11/12/2022 CLINICAL DATA:  Status post pericardial window creation. Pleural effusion. EXAM: PORTABLE CHEST 1 VIEW COMPARISON:  November 11, 2022. FINDINGS: Stable cardiomegaly. Stable mild left pleural effusion is noted with probable underlying atelectasis or infiltrate. Minimal right basilar subsegmental atelectasis is noted. Bony thorax is unremarkable. IMPRESSION: Stable mild left pleural effusion with probable underlying atelectasis or infiltrate. Electronically Signed   By: Marijo Conception M.D.   On: 11/12/2022 08:16   DG Chest Port 1 View  Result Date: 11/11/2022 CLINICAL DATA:  Chest pain. The patient is status post pericardial window. EXAM: PORTABLE CHEST 1 VIEW COMPARISON:  Chest radiograph dated 11/09/2022. FINDINGS: The heart is enlarged likely slightly decreased since 11/07/2022 there is a small left pleural effusion with associated atelectasis/airspace disease. The right lung is clear and there is no right pleural effusion. There is no pneumothorax. A right internal jugular approach vascular catheter tip overlies the right brachiocephalic vein. IMPRESSION: 1. Small left pleural effusion with associated atelectasis/airspace disease. 2. Cardiomegaly, likely slightly decreased since 11/07/2022. Electronically Signed   By: Zerita Boers M.D.   On: 11/11/2022 10:05    Cardiac Studies    11/09/22: TTE 1. Left ventricular ejection fraction, by estimation, is 40 to 45%. The  left ventricle has mildly decreased function. The left ventricle has no  regional wall motion abnormalities.   2. Right ventricular systolic function is mildly reduced. The right  ventricular size is normal.   3. No RA or RV collapse. There is no respirophasic of mitral valve inflow  velocities. There is IVC  plethora.. Large pericardial effusion. The  pericardial effusion is surrounding the apex and circumferential. There  is  no evidence of cardiac tamponade.   4. The mitral valve is normal in structure. No evidence of mitral valve  regurgitation. No evidence of mitral stenosis.   5. The aortic valve is tricuspid. Aortic valve regurgitation is not  visualized. No aortic stenosis is present.   6. The inferior vena cava is dilated in size with <50% respiratory  variability, suggesting right atrial pressure of 15 mmHg.   Comparison(s): Prior images reviewed side by side. LV is less vigorous  than prior.    11/08/22: limited echo 1. Large pericardial effusion. The pericardial effusion is surrounding  the apex. There RV does not fully expand. From 8:10 AM to 8:32 AM (last  image) improvedment in size of effusion. Still severe effusion at last  image.  Comparison(s): Prior images reviewed side by side. Effusion size has  improved.   FINDINGS   Pericardium: A large pericardial effusion is present. The pericardial  effusion is surrounding the apex. There is diastolic collapse of the right  ventricular free wall.    11/07/22: TTE 1. Left ventricular ejection fraction, by estimation, is 35 to 40%. The  left ventricle has moderately decreased function. The left ventricle  demonstrates global hypokinesis. Left ventricular diastolic parameters are  indeterminate.   2. Right ventricular systolic function is normal. The right ventricular  size is normal. Tricuspid regurgitation signal is inadequate for assessing  PA pressure.   3. Left atrial size was mildly dilated.   4. Large pericardial effusion. The pericardial effusion is  circumferential. Findings are consistent with cardiac tamponade.   5. The mitral valve is normal in structure. Trivial mitral valve  regurgitation. No evidence of mitral stenosis.   6. The aortic valve is normal in structure. Aortic valve regurgitation is  not visualized. No aortic stenosis is present.   7. There is borderline dilatation of the aortic root, measuring 39 mm.   8. The  inferior vena cava is dilated in size with <50% respiratory  variability, suggesting right atrial pressure of 15 mmHg.   Comparison(s): A prior study was performed on 12/07/2021. The left  ventricular function is unchanged. The previous study showed a small  pericardial effusion anterior to the right ventricle. The effusion is now  large with findings of tamponade.    10/04/22 EPS/ablation CONCLUSIONS: 1. Successful PVI 2. Successful ablation/isolation of the posterior wall 3. Intracardiac echo reveals trivial pericardial effusion, left sided common PV ostium, large mobile eustachian valve. 4. No early apparent complications. 5. Protonix '40mg'$  PO daily x 45 days 6. Colchicine 0.'6mg'$  PO BID x 5 days   (NOTE: Due to the large mobile eustachian valve,  elected not to ablate the CTI).    06/29/22: TEE 1. Left ventricular ejection fraction, by estimation, is 25 to 30%. The  left ventricle has severely decreased function.   2. Right ventricular systolic function is mildly reduced. The right  ventricular size is normal.   3. Left atrial size was moderately dilated. No left atrial/left atrial  appendage thrombus was detected.   4. Right atrial size was severely dilated.   5. The mitral valve is normal in structure. Trivial mitral valve  regurgitation.   6. The aortic valve is tricuspid. Aortic valve regurgitation is trivial.   7. There is mild (Grade II) plaque.   8. Agitated saline contrast bubble study was negative, with no evidence  of any interatrial shunt.  Patient Profile     76 y.o. male with pericardial tamponade due to hemorrhagic pericardial effusion, likely due to delayed postprocedural pericarditis (pulmonary vein isolation and ablation of the posterior wall 10/04/2022).  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  Had significant AKI due to pericardial tamponade, rapid improvement after fluid drainage.  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  Slight bump in creatinine likely related to hemodynamic changes at the time of surgery.  No signs of overt hypervolemia.  Transition back to oral amiodarone.  Hold off starting his ARB and diuretic for another day while we watch progression of renal function.  Premature to restart his anticoagulant.  On colchicine for pericarditis.  For questions or updates, please contact [place] Please consult [email or web address] for contact info under        Signed, Sanda Klein, MD  11/12/2022, 9:05 AM

## 2022-11-12 NOTE — Progress Notes (Signed)
[address]       [place] [address]             [phone]      1 Day Post-Op  Procedure(s) (LRB): SUBXYPHOID PERICARDIAL WINDOW (N/A) TRANSESOPHAGEAL ECHOCARDIOGRAM (TEE) (N/A)   Total Length of Stay:  LOS: 5 days   SUBJECTIVE: Didn't sleep much last night Fixating on tube and rash on arm Vitals:   11/12/22 0500 11/12/22 0600  BP: (!) 108/54 115/66  Pulse: (!) 59 61  Resp: 16 16  Temp:    SpO2: 93% 94%    Intake/Output      11/24 0701 11/25 0700 11/25 0701 11/26 0700   P.O. 360    I.V. (mL/kg) 1717.1 (26.5)    Other     IV Piggyback 200.1    Total Intake(mL/kg) 2277.2 (35.2)    Urine (mL/kg/hr) 1150 (0.7)    Drains     Other 300    Chest Tube 50    Total Output 1500    Net +777.2             sodium chloride 10 mL/hr at 11/12/22 0621   amiodarone 30 mg/hr (11/12/22 0621)    CBC    Component Value Date/Time   WBC 18.8 (H) 11/12/2022 0306   RBC 3.58 (L) 11/12/2022 0306   HGB 10.6 (L) 11/12/2022 0306   HGB 12.7 (L) 09/22/2022 1332   HCT 30.9 (L) 11/12/2022 0306   HCT 37.2 (L) 09/22/2022 1332   PLT 164 11/12/2022 0306   PLT 92 (LL) 09/22/2022 1332   MCV 86.3 11/12/2022 0306   MCV 89 09/22/2022 1332   MCH 29.6 11/12/2022 0306   MCHC 34.3 11/12/2022 0306   RDW 14.4 11/12/2022 0306   RDW 14.4 09/22/2022 1332   LYMPHSABS 1.0 11/09/2022 0519   LYMPHSABS 1.5 09/22/2022 1332   MONOABS 4.2 (H) 11/09/2022 0519   EOSABS 0.1 11/09/2022 0519   EOSABS 0.1 09/22/2022 1332   BASOSABS 0.0 11/09/2022 0519   BASOSABS 0.0 09/22/2022 1332   CMP     Component Value Date/Time   NA 132 (L) 11/12/2022 0306   NA 144 09/22/2022 1332   K 4.5 11/12/2022 0306   CL 97 (L) 11/12/2022 0306   CO2 23 11/12/2022 0306   GLUCOSE 243 (H) 11/12/2022 0306   BUN 29 (H) 11/12/2022 0306   BUN 29 (H) 09/22/2022 1332   CREATININE 1.50 (H) 11/12/2022 0306   CALCIUM 8.6 (L) 11/12/2022 0306   PROT 5.9 (L) 11/08/2022 0413   ALBUMIN 3.2 (L) 11/08/2022  0413   AST 57 (H) 11/08/2022 0413   ALT 85 (H) 11/08/2022 0413   ALKPHOS 134 (H) 11/08/2022 0413   BILITOT 1.4 (H) 11/08/2022 0413   GFRNONAA 48 (L) 11/12/2022 0306   ABG    Component Value Date/Time   PHART 7.456 (H) 11/07/2022 1509   PCO2ART 28.6 (L) 11/07/2022 1509   PO2ART 87 11/07/2022 1509   HCO3 20.2 11/07/2022 1509   TCO2 21 (L) 11/07/2022 1509   ACIDBASEDEF 3.0 (H) 11/07/2022 1509   O2SAT 97 11/07/2022 1509   CBG (last 3)  Recent Labs    11/11/22 1123 11/11/22 1612  GLUCAP 168* 229*   EXAM Lungs: decreased left base Card: rr Ext: palpable roughness over right forarm but not discoleration  ASSESSMENT: SP Drainage of pericardial effusion Will remove drain today with only 50cc out.  Does have Left pleural effusion by CXR. Drainage as per primary team,  still on oxygen In NSR   [name], MD 11/12/2022

## 2022-11-13 ENCOUNTER — Other Ambulatory Visit: Payer: Self-pay | Admitting: Physician Assistant

## 2022-11-13 ENCOUNTER — Inpatient Hospital Stay (HOSPITAL_COMMUNITY): Payer: PPO

## 2022-11-13 DIAGNOSIS — I3139 Other pericardial effusion (noninflammatory): Secondary | ICD-10-CM | POA: Diagnosis not present

## 2022-11-13 DIAGNOSIS — I9789 Other postprocedural complications and disorders of the circulatory system, not elsewhere classified: Secondary | ICD-10-CM

## 2022-11-13 DIAGNOSIS — Z9889 Other specified postprocedural states: Secondary | ICD-10-CM

## 2022-11-13 LAB — CBC
HCT: 28.2 % — ABNORMAL LOW (ref 39.0–52.0)
Hemoglobin: 9.6 g/dL — ABNORMAL LOW (ref 13.0–17.0)
MCH: 29.6 pg (ref 26.0–34.0)
MCHC: 34 g/dL (ref 30.0–36.0)
MCV: 87 fL (ref 80.0–100.0)
Platelets: 175 10*3/uL (ref 150–400)
RBC: 3.24 MIL/uL — ABNORMAL LOW (ref 4.22–5.81)
RDW: 14.6 % (ref 11.5–15.5)
WBC: 21.1 10*3/uL — ABNORMAL HIGH (ref 4.0–10.5)
nRBC: 0 % (ref 0.0–0.2)

## 2022-11-13 LAB — COMPREHENSIVE METABOLIC PANEL
ALT: 58 U/L — ABNORMAL HIGH (ref 0–44)
AST: 69 U/L — ABNORMAL HIGH (ref 15–41)
Albumin: 2.6 g/dL — ABNORMAL LOW (ref 3.5–5.0)
Alkaline Phosphatase: 107 U/L (ref 38–126)
Anion gap: 11 (ref 5–15)
BUN: 33 mg/dL — ABNORMAL HIGH (ref 8–23)
CO2: 23 mmol/L (ref 22–32)
Calcium: 8.4 mg/dL — ABNORMAL LOW (ref 8.9–10.3)
Chloride: 101 mmol/L (ref 98–111)
Creatinine, Ser: 1.39 mg/dL — ABNORMAL HIGH (ref 0.61–1.24)
GFR, Estimated: 53 mL/min — ABNORMAL LOW (ref >60)
Glucose, Bld: 136 mg/dL — ABNORMAL HIGH (ref 70–99)
Potassium: 4.9 mmol/L (ref 3.5–5.1)
Sodium: 135 mmol/L (ref 135–145)
Total Bilirubin: 0.9 mg/dL (ref 0.3–1.2)
Total Protein: 5.2 g/dL — ABNORMAL LOW (ref 6.5–8.1)

## 2022-11-13 LAB — ECHOCARDIOGRAM LIMITED
Height: 65 in
Weight: 2250.46 oz

## 2022-11-13 LAB — GLUCOSE, CAPILLARY
Glucose-Capillary: 126 mg/dL — ABNORMAL HIGH (ref 70–99)
Glucose-Capillary: 103 mg/dL — ABNORMAL HIGH (ref 70–99)

## 2022-11-13 LAB — CULTURE, BODY FLUID W GRAM STAIN -BOTTLE: Culture: NO GROWTH

## 2022-11-13 LAB — MAGNESIUM: Magnesium: 1.5 mg/dL — ABNORMAL LOW (ref 1.7–2.4)

## 2022-11-13 MED ORDER — ORAL CARE MOUTH RINSE: 15.0000 mL | OROMUCOSAL | Status: DC | PRN

## 2022-11-13 MED ORDER — MAGNESIUM SULFATE 2 GM/50ML IV SOLN
2.0000 g | Freq: Once | INTRAVENOUS | Status: AC
  Administered 2022-11-13: 2 g via INTRAVENOUS
  Filled 2022-11-13: qty 50

## 2022-11-13 MED ORDER — DIPHENHYDRAMINE HCL 25 MG PO CAPS
25.0000 mg | ORAL_CAPSULE | Freq: Once | ORAL | Status: AC
  Administered 2022-11-13: 25 mg via ORAL
  Filled 2022-11-13: qty 1

## 2022-11-13 MED ORDER — AMIODARONE HCL 200 MG PO TABS
200.0000 mg | ORAL_TABLET | Freq: Every day | ORAL | Status: DC
  Administered 2022-11-13: 200 mg via ORAL
  Filled 2022-11-13: qty 1

## 2022-11-13 MED ORDER — AMIODARONE HCL 200 MG PO TABS: 200.0000 mg | ORAL_TABLET | Freq: Every day | ORAL | 1 refills | Status: DC

## 2022-11-13 MED ORDER — COLCHICINE 0.6 MG PO TABS: 0.6000 mg | ORAL_TABLET | Freq: Every day | ORAL | 0 refills | Status: DC

## 2022-11-13 NOTE — Progress Notes (Signed)
Rounding Note    Patient Name: Robert Stark Date of Encounter: 11/13/2022  [facility] Cardiologist: None   Subjective   Uneventful night.  Maintaining normal sinus rhythm.  No more problems with a rash.  Chest tube removed.  Inpatient Medications    Scheduled Meds:  acetaminophen  1,000 mg Oral Q6H   Or   acetaminophen (TYLENOL) oral liquid 160 mg/5 mL  1,000 mg Oral Q6H   amiodarone  400 mg Oral Daily   atorvastatin  40 mg Oral Daily   azelastine  1 spray Each Nare BID   B-complex with vitamin C  1 tablet Oral Daily   bisacodyl  10 mg Oral Daily   Chlorhexidine Gluconate Cloth  6 each Topical Daily   colchicine  0.3 mg Oral Daily   heparin  5,000 Units Subcutaneous Q8H   influenza vaccine adjuvanted  0.5 mL Intramuscular Tomorrow-1000   insulin aspart  0-5 Units Subcutaneous QHS   insulin aspart  0-9 Units Subcutaneous TID WC   pantoprazole  40 mg Oral Daily   senna-docusate  1 tablet Oral QHS   sodium chloride flush  3 mL Intravenous Q12H   tamsulosin  0.4 mg Oral QPC supper   Continuous Infusions:  sodium chloride Stopped (11/12/22 1102)   magnesium sulfate bolus IVPB     PRN Meds: sodium chloride, benzonatate, fentaNYL (SUBLIMAZE) injection, melatonin, metoprolol tartrate, morphine injection, ondansetron (ZOFRAN) IV, oxyCODONE-acetaminophen, rOPINIRole   Vital Signs    Vitals:   11/13/22 0500 11/13/22 0600 11/13/22 0700 11/13/22 0800  BP: 106/60 102/63 116/64 127/80  Pulse: (!) 54 (!) 53 69 69  Resp: '18 18 18 12  '$ Temp:      TempSrc:      SpO2: 95% 95% 93% 95%  Weight: 63.8 kg     Height:        Intake/Output Summary (Last 24 hours) at 11/13/2022 0901 Last data filed at 11/13/2022 0600 Gross per 24 hour  Intake 487.81 ml  Output 2645 ml  Net -2157.19 ml      11/13/2022    5:00 AM 11/12/2022    5:00 AM 11/11/2022    5:00 AM  Last 3 Weights  Weight (lbs) 140 lb 10.5 oz 142 lb 10.2 oz 137 lb 5.6 oz  Weight (kg) 63.8 kg 64.7 kg  62.3 kg      Telemetry    Normal sinus rhythm/sinus bradycardia down to the 40s- Personally Reviewed  ECG    No new tracing- Personally Reviewed  Physical Exam  Appears well.  Comfortable GEN: No acute distress.   Neck: No JVD Cardiac: RRR, no murmurs, faint pericardial rub, no gallops.  Respiratory: Dullness to percussion and reduced breath sounds in the lower quarter of the left lung.  No rales. GI: Soft, nontender, non-distended  MS: No edema; No deformity. Neuro:  Nonfocal  Psych: Normal affect   Labs    High Sensitivity Troponin:   Recent Labs  Lab 11/07/22 1414 11/07/22 1615  TROPONINIHS 19* 17     Chemistry Recent Labs  Lab 11/07/22 1414 11/07/22 1509 11/08/22 0413 11/09/22 0233 11/10/22 0547 11/11/22 0330 11/12/22 0306 11/13/22 0208  NA 135   < > 135   < > 136 137 132* 135  K 4.5   < > 4.1   < > 4.6 4.9 4.5 4.9  CL 99  --  101   < > 102 98 97* 101  CO2 23  --  21*   < >  $'27 28 23 23  'B$ GLUCOSE 113*  --  172*   < > 106* 114* 243* 136*  BUN 57*  --  61*   < > 23 20 29* 33*  CREATININE 2.59*  --  2.08*   < > 1.17 1.19 1.50* 1.39*  CALCIUM 9.2  --  9.2   < > 8.3* 8.8* 8.6* 8.4*  MG  --   --   --   --  1.3* 1.8  --  1.5*  PROT 6.1*  --  5.9*  --   --   --   --  5.2*  ALBUMIN 3.5  --  3.2*  --   --   --   --  2.6*  AST 46*  --  57*  --   --   --   --  69*  ALT 82*  --  85*  --   --   --   --  58*  ALKPHOS 133*  --  134*  --   --   --   --  107  BILITOT 1.2  --  1.4*  --   --   --   --  0.9  GFRNONAA 25*  --  33*   < > >60 >60 48* 53*  ANIONGAP 13  --  13   < > '7 11 12 11   '$ < > = values in this interval not displayed.    Lipids No results for input(s): "CHOL", "TRIG", "HDL", "LABVLDL", "LDLCALC", "CHOLHDL" in the last 168 hours.  Hematology Recent Labs  Lab 11/11/22 0330 11/12/22 0306 11/13/22 0208  WBC 12.2* 18.8* 21.1*  RBC 3.22* 3.58* 3.24*  HGB 9.7* 10.6* 9.6*  HCT 28.1* 30.9* 28.2*  MCV 87.3 86.3 87.0  MCH 30.1 29.6 29.6  MCHC 34.5 34.3  34.0  RDW 14.4 14.4 14.6  PLT 140* 164 175   Thyroid No results for input(s): "TSH", "FREET4" in the last 168 hours.  BNP Recent Labs  Lab 11/07/22 1414  BNP 70.2    DDimer No results for input(s): "DDIMER" in the last 168 hours.   Radiology    DG Chest Port 1 View  Result Date: 11/12/2022 CLINICAL DATA:  Status post pericardial window creation. Pleural effusion. EXAM: PORTABLE CHEST 1 VIEW COMPARISON:  November 11, 2022. FINDINGS: Stable cardiomegaly. Stable mild left pleural effusion is noted with probable underlying atelectasis or infiltrate. Minimal right basilar subsegmental atelectasis is noted. Bony thorax is unremarkable. IMPRESSION: Stable mild left pleural effusion with probable underlying atelectasis or infiltrate. Electronically Signed   By: Marijo Conception M.D.   On: 11/12/2022 08:16   DG Chest Port 1 View  Result Date: 11/11/2022 CLINICAL DATA:  Chest pain. The patient is status post pericardial window. EXAM: PORTABLE CHEST 1 VIEW COMPARISON:  Chest radiograph dated 11/09/2022. FINDINGS: The heart is enlarged likely slightly decreased since 11/07/2022 there is a small left pleural effusion with associated atelectasis/airspace disease. The right lung is clear and there is no right pleural effusion. There is no pneumothorax. A right internal jugular approach vascular catheter tip overlies the right brachiocephalic vein. IMPRESSION: 1. Small left pleural effusion with associated atelectasis/airspace disease. 2. Cardiomegaly, likely slightly decreased since 11/07/2022. Electronically Signed   By: Zerita Boers M.D.   On: 11/11/2022 10:05    Cardiac Studies    11/09/22: TTE 1. Left ventricular ejection fraction, by estimation, is 40 to 45%. The  left ventricle has mildly decreased function. The left ventricle has no  regional wall motion abnormalities.   2. Right ventricular systolic function is mildly reduced. The right  ventricular size is normal.   3. No RA or RV  collapse. There is no respirophasic of mitral valve inflow  velocities. 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 Large pericardial effusion. The  pericardial effusion is surrounding the apex and circumferential. There is  no evidence of cardiac tamponade.   4. The mitral valve is normal in structure. No evidence of mitral valve  regurgitation. No evidence of mitral stenosis.   5. The aortic valve is tricuspid. Aortic valve regurgitation is not  visualized. No aortic stenosis is present.   6. The inferior vena cava is dilated in size with <50% respiratory  variability, suggesting right atrial pressure of 15 mmHg.   Comparison(s): Prior images reviewed side by side. LV is less vigorous  than prior.    11/08/22: limited echo 1. Large pericardial effusion. The pericardial effusion is surrounding  the apex. There RV does not fully expand. From 8:10 AM to 8:32 AM (last  image) improvedment in size of effusion. Still severe effusion at last  image.  Comparison(s): Prior images reviewed side by side. Effusion size has  improved.   FINDINGS   Pericardium: A large pericardial effusion is present. The pericardial  effusion is surrounding the apex. There is diastolic collapse of the right  ventricular free wall.    11/07/22: TTE 1. Left ventricular ejection fraction, by estimation, is 35 to 40%. The  left ventricle has moderately decreased function. The left ventricle  demonstrates global hypokinesis. Left ventricular diastolic parameters are  indeterminate.   2. Right ventricular systolic function is normal. The right ventricular  size is normal. Tricuspid regurgitation signal is inadequate for assessing  PA pressure.   3. Left atrial size was mildly dilated.   4. Large pericardial effusion. The pericardial effusion is  circumferential. Findings are consistent with cardiac tamponade.   5. The mitral valve is normal in structure. Trivial mitral valve  regurgitation. No evidence of mitral stenosis.   6.  The aortic valve is normal in structure. Aortic valve regurgitation is  not visualized. No aortic stenosis is present.   7. There is borderline dilatation of the aortic root, measuring 39 mm.   8. The inferior vena cava is dilated in size with <50% respiratory  variability, suggesting right atrial pressure of 15 mmHg.   Comparison(s): A prior study was performed on 12/07/2021. The left  ventricular function is unchanged. The previous study showed a small  pericardial effusion anterior to the right ventricle. The effusion is now  large with findings of tamponade.    10/04/22 EPS/ablation CONCLUSIONS: 1. Successful PVI 2. Successful ablation/isolation of the posterior wall 3. Intracardiac echo reveals trivial pericardial effusion, left sided common PV ostium, large mobile eustachian valve. 4. No early apparent complications. 5. Protonix '40mg'$  PO daily x 45 days 6. Colchicine 0.'6mg'$  PO BID x 5 days   (NOTE: Due to the large mobile eustachian valve,  elected not to ablate the CTI).    06/29/22: TEE 1. Left ventricular ejection fraction, by estimation, is 25 to 30%. The  left ventricle has severely decreased function.   2. Right ventricular systolic function is mildly reduced. The right  ventricular size is normal.   3. Left atrial size was moderately dilated. No left atrial/left atrial  appendage thrombus was detected.   4. Right atrial size was severely dilated.   5. The mitral valve is normal in structure. Trivial mitral valve  regurgitation.  6. The aortic valve is tricuspid. Aortic valve regurgitation is trivial.   7. There is mild (Grade II) plaque.   8. Agitated saline contrast bubble study was negative, with no evidence  of any interatrial shunt.   Patient Profile     76 y.o. male with pericardial tamponade due to hemorrhagic pericardial effusion, likely due to delayed postprocedural pericarditis (pulmonary vein isolation and ablation of the posterior wall 10/04/2022).   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  Had significant AKI due to pericardial tamponade, rapid improvement after fluid drainage.  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   Maintaining normal sinus rhythm. Renal function with improving trend. Moderate hypomagnesemia.  Fairly bradycardic, reduce amiodarone to 200 mg once daily. BP borderline low, no room for vasodilator therapy for cardiomyopathy (presumably tachycardia cardiomyopathy which will improve gradually). Clinically he appears euvolemic.  No need to start diuretics at this time. Colchicine for pericarditis. Correct magnesium level.  If limited echo shows no significant residual pericardial effusion, and CV surgery is in agreement, he may be ready for discharge today.     For questions or updates, please contact [place] Please consult [email or web address] for contact info under        Signed, Sanda Klein, MD  11/13/2022, 9:01 AM

## 2022-11-13 NOTE — Discharge Instructions (Signed)
Keep surgical site clean and dry.  No shower or bath for 1 week.  Watch for signs of infection such as drainage, redness spreading out from the incision site and fever.

## 2022-11-13 NOTE — Discharge Summary (Addendum)
Discharge Summary    Patient ID: Robert Stark MRN: [id number]; DOB: [birth year]  Admit date: 11/07/2022 Discharge date: 11/13/2022  PCP:  [name], [place] [facility] Cardiologist:  None  Electrophysiologist:  [name], MD       Discharge Diagnoses    Principal Problem:   Pericardial effusion Active Problems:   Pericarditis   Malnutrition of moderate degree    Diagnostic Studies/Procedures    Pericardiocentesis 11/08/2022     Successful pericardiocentesis from the subxiphoid approach with removal of 940 cc of bloody fluid.   Catheter sutured in place.   Catheter sutured in place.  Catheter to be removed based on drainage.  Based on the echo, there does still appear to be some pericardial fluid, particularly at the apex.  The patient tolerated the procedure well.   I called both emergency contacts that were listed in the chart but both numbers went to voicemail.     Echo 11/08/2022  1. Large pericardial effusion. The pericardial effusion is surrounding  the apex. There RV does not fully expand. From 8:10 AM to 8:32 AM (last  image) improvedment in size of effusion. Still severe effusion at last  image.   Comparison(s): Prior images reviewed side by side. Effusion size has  improved.   FINDINGS   Pericardium: A large pericardial effusion is present. The pericardial  effusion is surrounding the apex. There is diastolic collapse of the right  ventricular free wall.      Echo 11/09/2022 1. Left ventricular ejection fraction, by estimation, is 40 to 45%. The  left ventricle has mildly decreased function. The left ventricle has no  regional wall motion abnormalities.   2. Right ventricular systolic function is mildly reduced. The right  ventricular size is normal.   3. No RA or RV collapse. There is no respirophasic of mitral valve inflow  velocities. 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 Large pericardial effusion. The  pericardial  effusion is surrounding the apex and circumferential. There is  no evidence of cardiac tamponade.   4. The mitral valve is normal in structure. No evidence of mitral valve  regurgitation. No evidence of mitral stenosis.   5. The aortic valve is tricuspid. Aortic valve regurgitation is not  visualized. No aortic stenosis is present.   6. The inferior vena cava is dilated in size with <50% respiratory  variability, suggesting right atrial pressure of 15 mmHg.   Comparison(s): Prior images reviewed side by side. LV is less vigorous  than prior.      Limited echo 11/13/2022  1. Left ventricular ejection fraction, by estimation, is 45 to 50%. The  left ventricle has mildly decreased function.   Comparison(s): The left ventricular function has improved. Pericardial  effusion is largely gone, but a thickened pericardium remains.    _____________   History of Present Illness     Robert Stark is a 76 y.o. male with HTN, chronic systolic CHF, atrial fib with atrial fib ablation/17/23  who is being seen 11/07/2022 for the evaluation of SOB with pericardial effusion on CXR at the request of Dr. Alvino Chapel.   Mr. Deems with hx as above and A fib initially diagnosed 10/2021, unaware of palpitations and presenting as dyspnea.  Placed on xarelto,but did not tolerate and switiched to Eliquis for CHA2DS2VASc of 3.his echocardiogram showed left ventricular hypokinesis with EF 35-40% (felt to be due to tachycardia) and original dilt changed to BB.  He was cardioverted but eventually went  back into a fib.  Planned a fib ablation and in preparation Cardiac CTA revealing LAA thrombus, and ablation was cancelled.  Calcium score was 0.  06/29/22 TEE with EF 25-30%, RV mildly reduced LA mod dilated no thrombus in the left atrial appendage. RA severely dilated.  No significant valvular abnormalities or ASD/PFO.   Underwent uncomplicated ablation 99/83/38 (successful PVI, ablation/isolation of the posterior  wall).  Intracardiac echo revealed trivial pericardial effusion left sided common PV ostium, large mobile eustachian valve. Stable at discharge.     He felt great for the first 2 weeks after ablation.  Over the ensuing 2 weeks he has had progressively worsening shortness of breath on exertion, no short of breath at rest with some orthopnea.  He's also developed dizziness with sudden changes in position although he has not had frank syncope.  He has developed poor appetite but no nausea or vomiting, fever or chills, cough or hemoptysis   Today pt presented to [facility] urgent care for SOB and weakness.  Sp02 82% on RA with 3 L [place] sp02 95%.     Echocardiogram shows a large circumferential pericardial effusion with fibrinous strands and all the typical echo findings of tamponade.  He has not been hypotensive in the emergency room.   Labs show marked worsening of renal function from a baseline creatinine of 1.13-1.35 up to 2.59 today.  Electrolytes are normal.  High-sensitivity troponin is normal as is the BNP.  WBC is borderline elevated and hemoglobin has decreased from 12.7-10.5.     EKG:  The EKG was personally reviewed and demonstrates:  NSR with T wave inversions inf lateral  Hospital Course     Consultants: N/A   His echo and clinical picture is consistent with pericardial tamponade.  Patient was admitted to internal medicine service, cardiology service was consulted.  He was not hypotensive on arrival, however his renal function showed marked deterioration consistent with lower cardiac output.  Eliquis was held, patient was started on colchicine as anti-inflammatory agent.  He was taken to Cath Lab on 09/08/2022 and underwent successful pericardiocentesis with removal of 940 cc of bloody fluid.  Repeat limited echocardiogram obtained on 11/08/2022 showed a large pericardial effusion surrounding the apex, LV was not fully expanded, the size of effusion has improved.  Postprocedure, he went into atrial  fibrillation with RVR, he was converted back to sinus rhythm on IV amiodarone.  Anticoagulation was held given the hemorrhagic nature of the pericardial effusion and pericarditis.  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  Repeat limited echocardiogram obtained 911/22/2023 showed EF 40 to 45%, normal RV and RA collapse, persistent large pericardial effusion with fibrinous strand.  CT surgery was consulted on 11/09/2022 to assess the persistent large pericardial effusion.  Patient ultimately underwent subxiphoid pericardial window creation by Dr. Lavonna Monarch on 11/11/2022.  Pericardial drain and chest tube were removed on 11/12/2022.    Patient was seen in the morning of 11/13/2022 at which time he was hemodynamically stable. Limited echocardiogram showed EF 45 to 50%, pericardial effusion is largely gone by thickened pericardium remains.  Given his bradycardia, amiodarone was reduced to 200 mg daily.  The decision was made to defer the restart of anticoagulation to outpatient.  He was not started on diuretic as he appears to be euvolemic.  Patient was seen by Dr. Sallyanne Kuster, he is deemed stable for discharge from the cardiac perspective.  We will arrange limited echocardiogram next week to reevaluate pericardial effusion.  He  will need to follow-up with the EP service in 2 weeks.  Will need to reassess the patient to consider restarting anticoagulation as outpatient.  I have sent staff message to our scheduler to contact the patient to arrange the follow-up.   Note, medication changes during this hospitalization include addition of colchicine (he has been receiving 0.3 mg daily of colchicine in the hospital due to poor renal function as his creatinine at some point was greater than 2, however by the time of discharge, renal function has improved, therefore he was discharged on 0.6 mg daily of colchicine, I have given him a 90-day supply.)  Home metoprolol succinate was  discontinued due to bradycardia on amiodarone.  Home losartan-HCTZ has been discontinued as his blood pressure has been normal without blood pressure medication.  Xarelto has been temporarily discontinued with plan to hopefully restart as outpatient on the next follow-up if pericardial effusion does not come back and the thickened pericardium improve on the next echocardiogram.      Did the patient have an acute coronary syndrome (MI, NSTEMI, STEMI, etc) this admission?:  No                               Did the patient have a percutaneous coronary intervention (stent / angioplasty)?:  No.          _____________  Discharge Vitals Blood pressure 130/68, pulse 78, temperature 97.6 F (36.4 C), temperature source Oral, resp. rate 17, height '5\' 5"'$  (1.651 m), weight 63.8 kg, SpO2 97 %.  Filed Weights   11/11/22 0500 11/12/22 0500 11/13/22 0500  Weight: 62.3 kg 64.7 kg 63.8 kg    Labs & Radiologic Studies    CBC Recent Labs    11/12/22 0306 11/13/22 0208  WBC 18.8* 21.1*  HGB 10.6* 9.6*  HCT 30.9* 28.2*  MCV 86.3 87.0  PLT 164 409   Basic Metabolic Panel Recent Labs    11/11/22 0330 11/12/22 0306 11/13/22 0208  NA 137 132* 135  K 4.9 4.5 4.9  CL 98 97* 101  CO2 '28 23 23  '$ GLUCOSE 114* 243* 136*  BUN 20 29* 33*  CREATININE 1.19 1.50* 1.39*  CALCIUM 8.8* 8.6* 8.4*  MG 1.8  --  1.5*   Liver Function Tests Recent Labs    11/13/22 0208  AST 69*  ALT 58*  ALKPHOS 107  BILITOT 0.9  PROT 5.2*  ALBUMIN 2.6*   No results for input(s): "LIPASE", "AMYLASE" in the last 72 hours. High Sensitivity Troponin:   Recent Labs  Lab 11/07/22 1414 11/07/22 1615  TROPONINIHS 19* 17    BNP Invalid input(s): "POCBNP" D-Dimer No results for input(s): "DDIMER" in the last 72 hours. Hemoglobin A1C No results for input(s): "HGBA1C" in the last 72 hours. Fasting Lipid Panel No results for input(s): "CHOL", "HDL", "LDLCALC", "TRIG", "CHOLHDL", "LDLDIRECT" in the last 72  hours. Thyroid Function Tests No results for input(s): "TSH", "T4TOTAL", "T3FREE", "THYROIDAB" in the last 72 hours.  Invalid input(s): "FREET3" _____________  ECHOCARDIOGRAM LIMITED  Result Date: 11/13/2022    ECHOCARDIOGRAM LIMITED REPORT   Patient Name:   REMI LOPATA Date of Exam: 11/13/2022 Medical Rec #:  [id number]        Height:       65.0 in Accession #:    [id number]       Weight:       140.7 lb Date of Birth:  1946/10/11  BSA:          1.703 m Patient Age:    89 years         BP:           143/80 mmHg Patient Gender: M                HR:           61 bpm. Exam Location:  Inpatient Procedure: Limited Echo, Cardiac Doppler and Color Doppler Indications:    Pericardial effusion  History:        Patient has prior history of Echocardiogram examinations, most                 recent 11/11/2022. Arrythmias:Atrial Fibrillation; Risk                 Factors:Hypertension and Dyslipidemia. Pericarditis. Pericardial                 effustion. S/P ablation.  Sonographer:    Clayton Lefort RDCS (AE) Referring Phys: [facility] CROIT[facility]U  Sonographer Comments: Bandages from pericardial window covering apical area. IMPRESSIONS  1. Left ventricular ejection fraction, by estimation, is 45 to 50%. The left ventricle has mildly decreased function. Comparison(s): The left ventricular function has improved. Pericardial effusion is largely gone, but a thickened pericardium remains. FINDINGS  Left Ventricle: Left ventricular ejection fraction, by estimation, is 45 to 50%. The left ventricle has mildly decreased function. Pericardium: Trivial pericardial effusion is present. Thickening/calcification of pericardium present. Additional Comments: There is pleural effusion in the left lateral region.  Sanda Klein MD Electronically signed by Sanda Klein MD Signature Date/Time: 11/13/2022/12:04:16 PM    Final    DG Chest Port 1 View  Result Date: 11/12/2022 CLINICAL DATA:  Status post pericardial window creation.  Pleural effusion. EXAM: PORTABLE CHEST 1 VIEW COMPARISON:  November 11, 2022. FINDINGS: Stable cardiomegaly. Stable mild left pleural effusion is noted with probable underlying atelectasis or infiltrate. Minimal right basilar subsegmental atelectasis is noted. Bony thorax is unremarkable. IMPRESSION: Stable mild left pleural effusion with probable underlying atelectasis or infiltrate. Electronically Signed   By: Marijo Conception M.D.   On: 11/12/2022 08:16   DG Chest Port 1 View  Result Date: 11/11/2022 CLINICAL DATA:  Chest pain. The patient is status post pericardial window. EXAM: PORTABLE CHEST 1 VIEW COMPARISON:  Chest radiograph dated 11/09/2022. FINDINGS: The heart is enlarged likely slightly decreased since 11/07/2022 there is a small left pleural effusion with associated atelectasis/airspace disease. The right lung is clear and there is no right pleural effusion. There is no pneumothorax. A right internal jugular approach vascular catheter tip overlies the right brachiocephalic vein. IMPRESSION: 1. Small left pleural effusion with associated atelectasis/airspace disease. 2. Cardiomegaly, likely slightly decreased since 11/07/2022. Electronically Signed   By: Zerita Boers M.D.   On: 11/11/2022 10:05   ECHOCARDIOGRAM LIMITED  Result Date: 11/09/2022    ECHOCARDIOGRAM LIMITED REPORT   Patient Name:   NORVAL SLAVEN Date of Exam: 11/09/2022 Medical Rec #:  [id number]        Height:       65.0 in Accession #:    [id number]       Weight:       146.8 lb Date of Birth:  1946/06/10        BSA:          1.735 m Patient Age:    66 years         BP:  115/86 mmHg Patient Gender: M                HR:           71 bpm. Exam Location:  Inpatient Procedure: Limited Echo, 2D Echo, Color Doppler and Limited Color Doppler Indications:    Pericardial effusion I31.3  History:        Patient has prior history of Echocardiogram examinations, most                 recent 11/08/2022. Pericarditis, Pericardial  Disease,                 Arrythmias:Atrial Fibrillation, Signs/Symptoms:Shortness of                 Breath; Risk Factors:Hypertension and Dyslipidemia.  Sonographer:    Greer Pickerel Referring Phys: [phone] [facility] CROITORU  Sonographer Comments: Image acquisition challenging due to respiratory motion. IMPRESSIONS  1. Left ventricular ejection fraction, by estimation, is 40 to 45%. The left ventricle has mildly decreased function. The left ventricle has no regional wall motion abnormalities.  2. Right ventricular systolic function is mildly reduced. The right ventricular size is normal.  3. No RA or RV collapse. There is no respirophasic of mitral valve inflow velocities. 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 Large pericardial effusion. The pericardial effusion is surrounding the apex and circumferential. There is no evidence of cardiac tamponade.  4. The mitral valve is normal in structure. No evidence of mitral valve regurgitation. No evidence of mitral stenosis.  5. The aortic valve is tricuspid. Aortic valve regurgitation is not visualized. No aortic stenosis is present.  6. The inferior vena cava is dilated in size with <50% respiratory variability, suggesting right atrial pressure of 15 mmHg. Comparison(s): Prior images reviewed side by side. LV is less vigorous than prior. FINDINGS  Left Ventricle: Left ventricular ejection fraction, by estimation, is 40 to 45%. The left ventricle has mildly decreased function. The left ventricle has no regional wall motion abnormalities. The left ventricular internal cavity size was normal in size. There is no left ventricular hypertrophy. Right Ventricle: The right ventricular size is normal. No increase in right ventricular wall thickness. Right ventricular systolic function is mildly reduced. Left Atrium: Left atrial size was normal in size. Right Atrium: Right atrial size was normal in size. Pericardium: No RA or RV collapse. There is no respirophasic of mitral valve inflow velocities.  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 A large pericardial effusion is present. The pericardial effusion is surrounding the apex and circumferential. There is no evidence of cardiac tamponade. Mitral Valve: The mitral valve is normal in structure. No evidence of mitral valve stenosis. Tricuspid Valve: The tricuspid valve is normal in structure. Tricuspid valve regurgitation is not demonstrated. No evidence of tricuspid stenosis. Aortic Valve: The aortic valve is tricuspid. Aortic valve regurgitation is not visualized. No aortic stenosis is present. Pulmonic Valve: The pulmonic valve was normal in structure. Pulmonic valve regurgitation is not visualized. No evidence of pulmonic stenosis. Aorta: The aortic root is normal in size and structure. Venous: The inferior vena cava is dilated in size with less than 50% respiratory variability, suggesting right atrial pressure of 15 mmHg. IAS/Shunts: No atrial level shunt detected by color flow Doppler. LEFT VENTRICLE PLAX 2D LVIDd:         4.40 cm LVIDs:         3.50 cm LV PW:         0.70 cm LV IVS:  0.70 cm  RIGHT VENTRICLE RV S prime:     8.70 cm/s TAPSE (M-mode): 1.0 cm LEFT ATRIUM         Index LA diam:    2.70 cm 1.56 cm/m Rudean Haskell MD Electronically signed by Rudean Haskell MD Signature Date/Time: 11/09/2022/10:55:29 AM    Final    DG CHEST PORT 1 VIEW  Result Date: 11/09/2022 CLINICAL DATA:  Pericardial effusion, weakness, shortness of breath, post pericardiocentesis on 11/08/2022 EXAM: PORTABLE CHEST 1 VIEW COMPARISON:  Portable exam 0703 hours compared to 11/07/2022 FINDINGS: Enlargement of cardiac silhouette unchanged since 11/07/2022. Mediastinal contours and pulmonary vascularity normal. LEFT pleural effusion and basilar atelectasis. Upper lungs clear. No infiltrate or pneumothorax. IMPRESSION: Persistent enlargement of cardiac silhouette despite interval pericardiocentesis, question recurrent pericardial effusion. Electronically Signed   By: Lavonia Dana M.D.   On: 11/09/2022 08:43   ECHOCARDIOGRAM LIMITED  Result Date: 11/08/2022    ECHOCARDIOGRAM LIMITED REPORT   Patient Name:   AHMANI PREHN Date of Exam: 11/08/2022 Medical Rec #:  [id number]        Height:       65.0 in Accession #:    [id number]       Weight:       140.0 lb Date of Birth:  [birth year]        BSA:          1.700 m Patient Age:    87 years         BP:           100/82 mmHg Patient Gender: M                HR:           100 bpm. Exam Location:  Inpatient Procedure: Limited Echo Indications:    Pericardial effusion I31.3  History:        Patient has prior history of Echocardiogram examinations, most                 recent 11/07/2022. Risk Factors:Hypertension. Acute                 pericarditis. 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 Acute                 transaminitis. Chronic normocytic anemia.  Sonographer:    Darlina Sicilian RDCS Referring Phys: [place]  1. Large pericardial effusion. The pericardial effusion is surrounding the apex. There RV does not fully expand. From 8:10 AM to 8:32 AM (last image) improvedment in size of effusion. Still severe effusion at last image. Comparison(s): Prior images reviewed side by side. Effusion size has improved. FINDINGS  Pericardium: A large pericardial effusion is present. The pericardial effusion is surrounding the apex. There is diastolic collapse of the right ventricular free wall. Rudean Haskell MD Electronically signed by Rudean Haskell MD Signature Date/Time: 11/08/2022/9:01:44 AM    Final    CARDIAC CATHETERIZATION  Result Date: 11/08/2022   Successful pericardiocentesis from the subxiphoid approach with removal of 940 cc of bloody fluid.   Catheter sutured in place. Catheter sutured in place.  Catheter to be removed based on drainage.  Based on the echo, there does still appear to be some pericardial fluid, particularly at the apex.  The patient tolerated the procedure well. I called both  emergency contacts that were listed in the chart but both numbers went to voicemail.   US RENAL  Result Date: 11/07/2022 CLINICAL DATA:  Acute  kidney injury. EXAM: RENAL / URINARY TRACT ULTRASOUND COMPLETE COMPARISON:  None Available. FINDINGS: Right Kidney: Renal measurements: 10.6 x 4.7 x 4.5 cm = volume: 117 mL. Borderline increased parenchymal echogenicity. No hydronephrosis. No visualized stone or focal lesion. Left Kidney: Renal measurements: 9.7 x 4.4 x 4.1 cm = volume: 92 mL. Borderline increased parenchymal echogenicity. No hydronephrosis. No visualized stone or focal lesion. Bladder: The left ureteral jet is seen. Physiologic bladder distention without wall thickening. 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 Other: Trace ascites in the right upper quadrant. IMPRESSION: 1. No obstructive uropathy. 2. Borderline increased renal parenchymal echogenicity typical of chronic medical renal disease. 3. Right bladder diverticulum. Electronically Signed   By: Keith Rake M.D.   On: 11/07/2022 19:17   ECHOCARDIOGRAM COMPLETE  Result Date: 11/07/2022    ECHOCARDIOGRAM REPORT   Patient Name:   GARREN GREENMAN Date of Exam: 11/07/2022 Medical Rec #:  [id number]        Height:       66.0 in Accession #:    [id number]       Weight:       144.0 lb Date of Birth:  05-19-1946        BSA:          1.739 m Patient Age:    64 years         BP:           111/81 mmHg Patient Gender: M                HR:           95 bpm. Exam Location:  Inpatient Procedure: 2D Echo, Cardiac Doppler and Color Doppler Indications:    Pericardial effusion I31.3  History:        Patient has prior history of Echocardiogram examinations, most                 recent 12/07/2022. Arrythmias:Atrial Fibrillation,                 Signs/Symptoms:Shortness of Breath; Risk Factors:Hypertension,                 Dyslipidemia and Former Smoker.  Sonographer:    Greer Pickerel Referring Phys: [place]  1. Left ventricular  ejection fraction, by estimation, is 35 to 40%. The left ventricle has moderately decreased function. The left ventricle demonstrates global hypokinesis. Left ventricular diastolic parameters are indeterminate.  2. Right ventricular systolic function is normal. The right ventricular size is normal. Tricuspid regurgitation signal is inadequate for assessing PA pressure.  3. Left atrial size was mildly dilated.  4. Large pericardial effusion. The pericardial effusion is circumferential. Findings are consistent with cardiac tamponade.  5. The mitral valve is normal in structure. Trivial mitral valve regurgitation. No evidence of mitral stenosis.  6. The aortic valve is normal in structure. Aortic valve regurgitation is not visualized. No aortic stenosis is present.  7. There is borderline dilatation of the aortic root, measuring 39 mm.  8. The inferior vena cava is dilated in size with <50% respiratory variability, suggesting right atrial pressure of 15 mmHg. Comparison(s): A prior study was performed on 12/07/2021. The left ventricular function is unchanged. The previous study showed a small pericardial effusion anterior to the right ventricle. The effusion is now large with findings of tamponade. FINDINGS  Left Ventricle: Left ventricular ejection fraction, by estimation, is 35 to 40%. The left ventricle has moderately decreased function. The left ventricle  demonstrates global hypokinesis. The left ventricular internal cavity size was normal in size. There is no left ventricular hypertrophy. Left ventricular diastolic parameters are indeterminate. Right Ventricle: The right ventricular size is normal. No increase in right ventricular wall thickness. Right ventricular systolic function is normal. Tricuspid regurgitation signal is inadequate for assessing PA pressure. Left Atrium: Left atrial size was mildly dilated. Right Atrium: Right atrial size was normal in size. Pericardium: A large pericardial effusion is  present. The pericardial effusion is circumferential. There is diastolic collapse of the right ventricular free wall, diastolic collapse of the right atrial wall, excessive respiratory variation in the mitral valve spectral Doppler velocities and excessive respiratory variation in the tricuspid valve spectral Doppler velocities. There is evidence of cardiac tamponade. Mitral Valve: The mitral valve is normal in structure. Trivial mitral valve regurgitation. No evidence of mitral valve stenosis. Tricuspid Valve: The tricuspid valve is normal in structure. Tricuspid valve regurgitation is not demonstrated. Aortic Valve: The aortic valve is normal in structure. Aortic valve regurgitation is not visualized. No aortic stenosis is present. Pulmonic Valve: The pulmonic valve was grossly normal. Pulmonic valve regurgitation is not visualized. Aorta: There is borderline dilatation of the aortic root, measuring 39 mm. Venous: The inferior vena cava is dilated in size with less than 50% respiratory variability, suggesting right atrial pressure of 15 mmHg. IAS/Shunts: No atrial level shunt detected by color flow Doppler.  LEFT VENTRICLE PLAX 2D LVOT diam:     2.20 cm     Diastology LV SV:         51          LV e' medial:    6.42 cm/s LV SV Index:   29          LV E/e' medial:  8.4 LVOT Area:     3.80 cm    LV e' lateral:   4.79 cm/s                            LV E/e' lateral: 11.3  LV Volumes (MOD) LV vol d, MOD A2C: 68.0 ml LV vol d, MOD A4C: 67.3 ml LV vol s, MOD A2C: 40.0 ml LV vol s, MOD A4C: 43.7 ml LV SV MOD A2C:     28.0 ml LV SV MOD A4C:     67.3 ml LV SV MOD BP:      24.6 ml RIGHT VENTRICLE TAPSE (M-mode): 0.6 cm LEFT ATRIUM             Index        RIGHT ATRIUM          Index LA Vol (A2C):   38.6 ml 22.19 ml/m  RA Area:     9.72 cm LA Vol (A4C):   65.7 ml 37.78 ml/m  RA Volume:   20.40 ml 11.73 ml/m LA Biplane Vol: 51.7 ml 29.73 ml/m  AORTIC VALVE LVOT Vmax:   92.60 cm/s LVOT Vmean:  66.400 cm/s LVOT VTI:     0.133 m  AORTA Ao Root diam: 3.90 cm Ao Asc diam:  3.30 cm MITRAL VALVE MV Area (PHT): 5.31 cm    SHUNTS MV Decel Time: 143 msec    Systemic VTI:  0.13 m MV E velocity: 54.15 cm/s  Systemic Diam: 2.20 cm MV A velocity: 56.20 cm/s MV E/A ratio:  0.96 Mihai Croitoru MD Electronically signed by Sanda Klein MD Signature Date/Time: 11/07/2022/6:50:02 PM    Final  DG Chest 2 View  Result Date: 11/07/2022 CLINICAL DATA:  Shortness of breath EXAM: CHEST - 2 VIEW COMPARISON:  09/05/2022 FINDINGS: Significant enlargement of cardiac silhouette since previous exam, cannot exclude pericardial effusion with this cardiac configuration. Mediastinal contours and pulmonary vascularity normal. Subsegmental atelectasis in lingula and at LEFT base. No acute infiltrate, pleural effusion, or pneumothorax. Osseous structures unremarkable. IMPRESSION: Subsegmental atelectasis LEFT base. Significant enlargement of cardiac silhouette since 09/05/2022, cannot exclude pericardial effusion with this configuration; consider echocardiography assessment. Electronically Signed   By: Lavonia Dana M.D.   On: 11/07/2022 15:06   Disposition   Pt is being discharged home today in good condition.  Follow-up Plans & Appointments     Follow-up Information     [name], MD Follow up on 01/02/2023.   Specialties: Cardiology, Radiology Why: 11:30AM. Electrophysiology follow up Contact information: [place] [address] [phone]         [facility] [place]. [facility] Follow up.   Specialty: Cardiology Why: I have sent staff message to our scheduler, office scheduler will contact you to arrange: 1. Limited echo to look at your pericardium next week. 2. Earlier visit with one of the EP APP in 2 weeks. Contact information: [address] [place] [id number] [place] [phone]        [name], MD Follow  up.   Specialty: Cardiothoracic Surgery Why: staff message sent to Cardiothoracic surgery service to contact you to arrange wound check. Contact information: [address] [place] [address] [phone]                Discharge Instructions     Diet - low sodium heart healthy   Complete by: As directed    Discharge wound care:   Complete by: As directed    Keep surgical site clean and dry. No shower or bath for 1 week. Watch to sign of infection such as redness, drainage or fever.   Increase activity slowly   Complete by: As directed         Discharge Medications   Allergies as of 11/13/2022       Reactions   Cheese Hives   Bee Venom Swelling   Venom -- Honey Bee   Chocolate Hives   In large quantities    Cocoa Hives   In large quantities   Other Itching   Buttermilk    Wasp Venom Itching        Medication List     STOP taking these medications    losartan-hydrochlorothiazide 100-12.5 MG tablet Commonly known as: HYZAAR   metoprolol succinate 50 MG 24 hr tablet Commonly known as: TOPROL-XL   Xarelto 20 MG Tabs tablet Generic drug: rivaroxaban       TAKE these medications    amiodarone 200 MG tablet Commonly known as: PACERONE Take 1 tablet (200 mg total) by mouth daily. Start taking on: November 14, 2022   atorvastatin 40 MG tablet Commonly known as: LIPITOR Take 40 mg by mouth daily.   azelastine 0.1 % nasal spray Commonly known as: ASTELIN Place 1 spray into both nostrils 2 (two) times daily. What changed: Another medication with the same name was removed. Continue taking this medication, and follow the directions you see here.   colchicine 0.6 MG tablet Take 1 tablet (0.6 mg total) by mouth daily. Start taking on: November 14, 2022  pantoprazole 40 MG tablet Commonly known as: PROTONIX Take 1 tablet (40 mg total) by mouth daily.   tadalafil 5 MG tablet Commonly known as: CIALIS Take 5 mg by mouth daily as  needed for erectile dysfunction.   tamsulosin 0.4 MG Caps capsule Commonly known as: FLOMAX Take 0.4 mg by mouth daily. For prostate               Discharge Care Instructions  (From admission, onward)           Start     Ordered   11/13/22 0000  Discharge wound care:       Comments: Keep surgical site clean and dry. No shower or bath for 1 week. Watch to sign of infection such as redness, drainage or fever.   11/13/22 1234               Outstanding Labs/Studies   N/A  Duration of Discharge Encounter   Greater than 30 minutes including physician time.  Hilbert Corrigan, PA 11/13/2022, 12:47 PM

## 2022-11-13 NOTE — Progress Notes (Signed)
  Echocardiogram 2D Echocardiogram has been performed.  Robert Stark 11/13/2022, 11:17 AM

## 2022-11-13 NOTE — Progress Notes (Signed)
  Transition of Care [facility]) Screening Note   Patient Details  Name: Robert Stark Date of Birth: [birth year]   Transition of Care [facility]) CM/SW Contact:    Dawayne Patricia, RN Phone Number: 11/13/2022, 11:21 AM    Transition of Care Department (TOC) has reviewed patient and note that pt POD1 s/p PERICARDIAL WINDOW-  no TOC needs have been identified at this time. We will continue to monitor patient advancement through interdisciplinary progression rounds. If new patient transition needs arise, please place a TOC consult.

## 2022-11-14 ENCOUNTER — Ambulatory Visit: Payer: PPO | Admitting: Cardiology

## 2022-11-14 ENCOUNTER — Telehealth: Payer: Self-pay | Admitting: Cardiology

## 2022-11-14 LAB — HEMOGLOBIN A1C
Hgb A1c MFr Bld: 7.3 % — ABNORMAL HIGH (ref 4.8–5.6)
Mean Plasma Glucose: 163 mg/dL

## 2022-11-14 NOTE — Telephone Encounter (Signed)
Called patient to schedule echo and then f/u with EP APP. Patient became emotional on the phone and just wanted me to let Dr. Quentin Ore know how phenomenal he is and how thankful he is for him and his staff. He expressed much gratitude and love and wanted me to pass the message on. Just FYI.

## 2022-11-23 ENCOUNTER — Ambulatory Visit (HOSPITAL_COMMUNITY): Payer: PPO | Attending: Physician Assistant

## 2022-11-23 DIAGNOSIS — I9789 Other postprocedural complications and disorders of the circulatory system, not elsewhere classified: Secondary | ICD-10-CM | POA: Diagnosis not present

## 2022-11-23 DIAGNOSIS — I3139 Other pericardial effusion (noninflammatory): Secondary | ICD-10-CM | POA: Diagnosis not present

## 2022-11-23 DIAGNOSIS — Z9889 Other specified postprocedural states: Secondary | ICD-10-CM | POA: Diagnosis not present

## 2022-11-23 LAB — ECHOCARDIOGRAM LIMITED
Area-P 1/2: 3.3 cm2
S' Lateral: 3.8 cm

## 2022-11-23 NOTE — Telephone Encounter (Signed)
Patient contacted the office asking if he may remove the bandage over his chest tube insertion site. Advised he may do so. 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 Per E. Barrett, PA, patient advised he may return back to work tomorrow. Patient acknowledged receipt.

## 2022-11-24 ENCOUNTER — Other Ambulatory Visit: Payer: Self-pay | Admitting: Thoracic Surgery (Cardiothoracic Vascular Surgery)

## 2022-11-24 DIAGNOSIS — Z9889 Other specified postprocedural states: Secondary | ICD-10-CM

## 2022-11-24 NOTE — Progress Notes (Signed)
        NO SHOW  [place] Handler, PA-C [facility] [phone]

## 2022-11-24 NOTE — Patient Instructions (Signed)
You may return to driving an automobile as long as you are no longer requiring oral narcotic pain relievers during the daytime.  It would be wise to start driving only short distances during the daylight and gradually increase from there as you feel comfortable.   Patient is counseled regarding the importance of long term risk factor modification as they pertain to the presence of ischemic heart disease including avoiding the use of all tobacco products, dietary modifications and medical therapy for diabetes, cholesterol and lipid management, and regular exercise.

## 2022-11-28 ENCOUNTER — Encounter: Payer: Self-pay | Admitting: Thoracic Surgery (Cardiothoracic Vascular Surgery)

## 2022-11-28 ENCOUNTER — Ambulatory Visit: Payer: Self-pay | Admitting: Physician Assistant

## 2022-11-29 NOTE — Progress Notes (Unsigned)
Cardiology Office Note Date:  11/29/2022  Patient ID:  Robert Stark, Robert Stark [birth year], MRN [id number] PCP:  [name], DO  Electrophysiologist: Dr. Quentin Ore  ***refresh   Chief Complaint: *** f/u on echo  History of Present Illness: [name] is a 76 y.o. 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  He was referred to AFib clinic and eventually EP for rhythm control options. Ultimately underwent AFib ablation 10/04/22, discharged same day. NOTED that at the time of his ablation ICE exam showed stable LV function and no change in the baseline pericardial effusion   Admitted 11/07/22 He did quite well for the initial couple weeks though began to feel progressively SOB, some orthostatic dizziness developed and went to the ER yesterday, sats 82% on RA, TTE noted large pericardial effusion  with fibrinous strands and all the typical echo findings of tamponade, though no hypotension, was in SR. Renal function significantly worsened from baseline. With stable BP, his a/c held and planned to pursue pericardial drain the next day He had pericardiocentesis this morning with drain placement, removing 940cc of bloody fluid. Post procedure developed rapid Afib and started on amiodarone gtt Dspite drain, f/u echo woth persistent large pericardial effusion Started on colchicine, and planned for window >> done 11/11/22. Developed some SB and amiodarone reduced to '200mg'$  daily, LVEF 45-5-%, not volume OL. pericardial effusion is largely gone by thickened pericardium remained. Renal function improved significantly and colchicine dose increased to 0.'6mg'$  daily Home metoprolol succinate was discontinued due to bradycardia on amiodarone.  Home losartan-HCTZ has been discontinued as his blood pressure has been normal without blood pressure  medication.  Xarelto has been temporarily discontinued with plan to hopefully restart as outpatient on the next follow-up if pericardial effusion does not come back and the thickened pericardium improve on the next echocardiogram. Discharged 11/13/22  He did no show to his CTS f/u appt on 11/28/22  TTE 11/23/22: LVEF remained 45-50% Trivial pericardial effusion. Trivial pericardial effusion is  present. The pericardial effusion is circumferential. There is no evidence  of cardiac tamponade.   *** symptoms *** duration of colchicine, get labs *** resume [place]? *** amio labs *** volume *** resume ARB for mild CM   Past Medical History:  Diagnosis Date   Hypertension     Past Surgical History:  Procedure Laterality Date   ATRIAL FIBRILLATION ABLATION N/A 10/04/2022   Procedure: ATRIAL FIBRILLATION ABLATION;  Surgeon: [name], MD;  Location: [place] [facility];  Service: Cardiovascular;  Laterality: N/A;   BUBBLE STUDY  06/29/2022   Procedure: BUBBLE STUDY;  Surgeon: [name], MD;  Location: [place];  Service: Cardiovascular;;   CARDIOVERSION N/A 12/28/2021   Procedure: CARDIOVERSION;  Surgeon: [name], MD;  Location: [facility] ENDOSCOPY;  Service: Cardiovascular;  Laterality: N/A;   CIRCUMCISION     PERICARDIOCENTESIS N/A 11/08/2022   Procedure: PERICARDIOCENTESIS;  Surgeon: [name], MD;  Location: [place] [facility];  Service: Cardiovascular;  Laterality: N/A;   SUBXYPHOID PERICARDIAL WINDOW N/A 11/11/2022   Procedure: SUBXYPHOID PERICARDIAL WINDOW;  Surgeon: [name], MD;  Location: [place];  Service: Thoracic;  Laterality: N/A;   TEE WITHOUT CARDIOVERSION N/A 06/29/2022   Procedure: TRANSESOPHAGEAL ECHOCARDIOGRAM (TEE);  Surgeon: [name], MD;  Location: [place];  Service: Cardiovascular;  Laterality: N/A;  TEE WITHOUT CARDIOVERSION N/A 11/11/2022   Procedure: TRANSESOPHAGEAL ECHOCARDIOGRAM (TEE);  Surgeon: [name],  MD;  Location: [facility] OR;  Service: Thoracic;  Laterality: N/A;   TONSILLECTOMY AND ADENOIDECTOMY      Current Outpatient Medications  Medication Sig Dispense Refill   amiodarone (PACERONE) 200 MG tablet Take 1 tablet (200 mg total) by mouth daily. 90 tablet 1   atorvastatin (LIPITOR) 40 MG tablet Take 40 mg by mouth daily.     azelastine (ASTELIN) 0.1 % nasal spray Place 1 spray into both nostrils 2 (two) times daily.     colchicine 0.6 MG tablet Take 1 tablet (0.6 mg total) by mouth daily. 90 tablet 0   pantoprazole (PROTONIX) 40 MG tablet Take 1 tablet (40 mg total) by mouth daily. 45 tablet 0   tadalafil (CIALIS) 5 MG tablet Take 5 mg by mouth daily as needed for erectile dysfunction.     tamsulosin (FLOMAX) 0.4 MG CAPS capsule Take 0.4 mg by mouth daily. For prostate     No current facility-administered medications for this visit.    Allergies:   Cheese, Bee venom, Chocolate, Cocoa, Other, and Wasp venom   Social History:  The patient  reports that he quit smoking about 40 years ago. His smoking use included cigarettes. He does not have any smokeless tobacco history on file. He reports that he does not currently use alcohol. He reports that he does not currently use drugs.   Family History:  The patient's family history includes Heart attack in his father and mother.  ROS:  Please see the history of present illness.    All other systems are reviewed and otherwise negative.   PHYSICAL EXAM:  VS:  There were no vitals taken for this visit. BMI: There is no height or weight on file to calculate BMI. Well nourished, well developed, in no acute distress HEENT: normocephalic, atraumatic Neck: no JVD, carotid bruits or masses Cardiac:  *** RRR; no significant murmurs, no rubs, or gallops Lungs:  *** CTA b/l, no wheezing, rhonchi or rales Abd: soft, nontender MS: no deformity or *** atrophy Ext: *** no edema Skin: warm and dry, no rash Neuro:  No gross deficits appreciated Psych:  euthymic mood, full affect   EKG:  Done today and reviewed by myself shows  ***  11/09/22: TTE 1. Left ventricular ejection fraction, by estimation, is 40 to 45%. The  left ventricle has mildly decreased function. The left ventricle has no  regional wall motion abnormalities.   2. Right ventricular systolic function is mildly reduced. The right  ventricular size is normal.   3. No RA or RV collapse. There is no respirophasic of mitral valve inflow  velocities. 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 Large pericardial effusion. The  pericardial effusion is surrounding the apex and circumferential. There is  no evidence of cardiac tamponade.   4. The mitral valve is normal in structure. No evidence of mitral valve  regurgitation. No evidence of mitral stenosis.   5. The aortic valve is tricuspid. Aortic valve regurgitation is not  visualized. No aortic stenosis is present.   6. The inferior vena cava is dilated in size with <50% respiratory  variability, suggesting right atrial pressure of 15 mmHg.   Comparison(s): Prior images reviewed side by side. LV is less vigorous  than prior.    11/08/22: limited echo 1. Large pericardial effusion. The pericardial effusion is surrounding  the apex. There RV does not fully expand. From 8:10 AM to 8:32 AM (last  image) improvedment in size of effusion. Still severe effusion at last  image.  Comparison(s): Prior images reviewed side by side. Effusion size has  improved.   FINDINGS   Pericardium: A large pericardial effusion is present. The pericardial  effusion is surrounding the apex. There is diastolic collapse of the right  ventricular free wall.    11/07/22: TTE 1. Left ventricular ejection fraction, by estimation, is 35 to 40%. The  left ventricle has moderately decreased function. The left ventricle  demonstrates global hypokinesis. Left ventricular diastolic parameters are  indeterminate.   2. Right ventricular systolic function is normal. The  right ventricular  size is normal. Tricuspid regurgitation signal is inadequate for assessing  PA pressure.   3. Left atrial size was mildly dilated.   4. Large pericardial effusion. The pericardial effusion is  circumferential. Findings are consistent with cardiac tamponade.   5. The mitral valve is normal in structure. Trivial mitral valve  regurgitation. No evidence of mitral stenosis.   6. The aortic valve is normal in structure. Aortic valve regurgitation is  not visualized. No aortic stenosis is present.   7. There is borderline dilatation of the aortic root, measuring 39 mm.   8. The inferior vena cava is dilated in size with <50% respiratory  variability, suggesting right atrial pressure of 15 mmHg.   Comparison(s): A prior study was performed on 12/07/2021. The left  ventricular function is unchanged. The previous study showed a small  pericardial effusion anterior to the right ventricle. The effusion is now  large with findings of tamponade.    10/04/22 EPS/ablation CONCLUSIONS: 1. Successful PVI 2. Successful ablation/isolation of the posterior wall 3. Intracardiac echo reveals trivial pericardial effusion, left sided common PV ostium, large mobile eustachian valve. 4. No early apparent complications. 5. Protonix '40mg'$  PO daily x 45 days 6. Colchicine 0.'6mg'$  PO BID x 5 days   (NOTE: Due to the large mobile eustachian valve,  elected not to ablate the CTI).    06/29/22: TEE 1. Left ventricular ejection fraction, by estimation, is 25 to 30%. The  left ventricle has severely decreased function.   2. Right ventricular systolic function is mildly reduced. The right  ventricular size is normal.   3. Left atrial size was moderately dilated. No left atrial/left atrial  appendage thrombus was detected.   4. Right atrial size was severely dilated.   5. The mitral valve is normal in structure. Trivial mitral valve  regurgitation.   6. The aortic valve is tricuspid. Aortic valve  regurgitation is trivial.   7. There is mild (Grade II) plaque.   8. Agitated saline contrast bubble study was negative, with no evidence  of any interatrial shunt.     Recent Labs: 11/07/2022: B Natriuretic Peptide 70.2 11/13/2022: ALT 58; BUN 33; Creatinine, Ser 1.39; Hemoglobin 9.6; Magnesium 1.5; Platelets 175; Potassium 4.9; Sodium 135  No results found for requested labs within last 365 days.   CrCl cannot be calculated (Unknown ideal weight.).   Wt Readings from Last 3 Encounters:  11/13/22 140 lb 10.5 oz (63.8 kg)  10/04/22 144 lb (65.3 kg)  06/29/22 144 lb (65.3 kg)     Other studies reviewed: Additional studies/records reviewed today include: summarized above  ASSESSMENT AND PLAN:  Persistent AFib CHA2DS2Vasc is 3, on Eliquis *** appropriately dosed *** Amiodarone will be a short term AAD    2.   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   HTN  Disposition: F/u with ***  Current medicines are reviewed at length with the patient today.  The patient did not have any concerns regarding medicines.  Venetia Night, PA-C 11/29/2022 8:50 AM     [facility] [address] [place] [address] [phone] (office)  [phone] (fax)

## 2022-12-01 ENCOUNTER — Ambulatory Visit: Payer: PPO | Attending: Physician Assistant | Admitting: Physician Assistant

## 2022-12-01 ENCOUNTER — Encounter: Payer: Self-pay | Admitting: Physician Assistant

## 2022-12-01 VITALS — BP 138/72 | HR 65 | Ht 65.0 in | Wt 139.0 lb

## 2022-12-01 DIAGNOSIS — Z79899 Other long term (current) drug therapy: Secondary | ICD-10-CM

## 2022-12-01 DIAGNOSIS — I9789 Other postprocedural complications and disorders of the circulatory system, not elsewhere classified: Secondary | ICD-10-CM

## 2022-12-01 DIAGNOSIS — I428 Other cardiomyopathies: Secondary | ICD-10-CM

## 2022-12-01 DIAGNOSIS — I10 Essential (primary) hypertension: Secondary | ICD-10-CM | POA: Diagnosis not present

## 2022-12-01 DIAGNOSIS — I4819 Other persistent atrial fibrillation: Secondary | ICD-10-CM | POA: Diagnosis not present

## 2022-12-01 DIAGNOSIS — I3139 Other pericardial effusion (noninflammatory): Secondary | ICD-10-CM

## 2022-12-01 NOTE — Patient Instructions (Signed)
Medication Instructions:   Your physician recommends that you continue on your current medications as directed. Please refer to the Current Medication list given to you today.  *If you need a refill on your cardiac medications before your next appointment, please call your pharmacy*   Lab Work: TODAY :  CMET CBC AND TSH    If you have labs (blood work) drawn today and your tests are completely normal, you will receive your results only by: [place] (if you have MyChart) OR A paper copy in the mail If you have any lab test that is abnormal or we need to change your treatment, we will call you to review the results.   Testing/Procedures:  Your physician has requested that you have an echocardiogram. Echocardiography is a painless test that uses sound waves to create images of your heart. It provides your doctor with information about the size and shape of your heart and how well your heart's chambers and valves are working. This procedure takes approximately one hour. There are no restrictions for this procedure. Please do NOT wear cologne, perfume, aftershave, or lotions (deodorant is allowed). Please arrive 15 minutes prior to your appointment time.    Follow-Up: At [facility], you and your health needs are our priority.  As part of our continuing mission to provide you with exceptional heart care, we have created designated Provider Care Teams.  These Care Teams include your primary Cardiologist (physician) and Advanced Practice Providers (APPs -  Physician Assistants and Nurse Practitioners) who all work together to provide you with the care you need, when you need it.  We recommend signing up for the patient portal called "MyChart".  Sign up information is provided on this After Visit Summary.  MyChart is used to connect with patients for Virtual Visits (Telemedicine).  Patients are able to view lab/test results, encounter notes, upcoming appointments, etc.  Non-urgent  messages can be sent to your provider as well.   To learn more about what you can do with MyChart, go to [email or web address].    Your next appointment:   1 month(s)  The format for your next appointment:   In Person  Provider:   Lars Mage, MD    Other Instructions   Important Information About Sugar

## 2022-12-02 ENCOUNTER — Other Ambulatory Visit: Payer: Self-pay | Admitting: *Deleted

## 2022-12-02 DIAGNOSIS — Z79899 Other long term (current) drug therapy: Secondary | ICD-10-CM

## 2022-12-02 LAB — COMPREHENSIVE METABOLIC PANEL
ALT: 19 IU/L (ref 0–44)
AST: 18 IU/L (ref 0–40)
Albumin/Globulin Ratio: 1.9 (ref 1.2–2.2)
Albumin: 3.8 g/dL (ref 3.8–4.8)
Alkaline Phosphatase: 105 IU/L (ref 44–121)
BUN/Creatinine Ratio: 14 (ref 10–24)
BUN: 15 mg/dL (ref 8–27)
Bilirubin Total: 0.7 mg/dL (ref 0.0–1.2)
CO2: 27 mmol/L (ref 20–29)
Calcium: 9.2 mg/dL (ref 8.6–10.2)
Chloride: 107 mmol/L — ABNORMAL HIGH (ref 96–106)
Creatinine, Ser: 1.1 mg/dL (ref 0.76–1.27)
Globulin, Total: 2 g/dL (ref 1.5–4.5)
Glucose: 115 mg/dL — ABNORMAL HIGH (ref 70–99)
Potassium: 5 mmol/L (ref 3.5–5.2)
Sodium: 143 mmol/L (ref 134–144)
Total Protein: 5.8 g/dL — ABNORMAL LOW (ref 6.0–8.5)
eGFR: 70 mL/min/{1.73_m2} (ref >59)

## 2022-12-02 LAB — CBC
Hematocrit: 31 % — ABNORMAL LOW (ref 37.5–51.0)
Hemoglobin: 10.5 g/dL — ABNORMAL LOW (ref 13.0–17.7)
MCH: 30.1 pg (ref 26.6–33.0)
MCHC: 33.9 g/dL (ref 31.5–35.7)
MCV: 89 fL (ref 79–97)
Platelets: 91 10*3/uL — CL (ref 150–450)
RBC: 3.49 x10E6/uL — ABNORMAL LOW (ref 4.14–5.80)
RDW: 15.5 % — ABNORMAL HIGH (ref 11.6–15.4)
WBC: 7.1 10*3/uL (ref 3.4–10.8)

## 2022-12-02 LAB — TSH: TSH: 1.15 u[IU]/mL (ref 0.450–4.500)

## 2022-12-02 MED ORDER — COLCHICINE 0.6 MG PO TABS: 0.3000 mg | ORAL_TABLET | Freq: Every day | ORAL | 6 refills | Status: DC

## 2022-12-06 ENCOUNTER — Ambulatory Visit: Payer: PPO

## 2022-12-06 ENCOUNTER — Ambulatory Visit (HOSPITAL_COMMUNITY): Payer: PPO | Attending: Cardiovascular Disease

## 2022-12-06 DIAGNOSIS — I3139 Other pericardial effusion (noninflammatory): Secondary | ICD-10-CM | POA: Diagnosis not present

## 2022-12-06 DIAGNOSIS — I9789 Other postprocedural complications and disorders of the circulatory system, not elsewhere classified: Secondary | ICD-10-CM | POA: Diagnosis not present

## 2022-12-06 DIAGNOSIS — Z79899 Other long term (current) drug therapy: Secondary | ICD-10-CM | POA: Insufficient documentation

## 2022-12-06 LAB — CBC
Hematocrit: 32.2 % — ABNORMAL LOW (ref 37.5–51.0)
Hemoglobin: 11 g/dL — ABNORMAL LOW (ref 13.0–17.7)
MCH: 30 pg (ref 26.6–33.0)
MCHC: 34.2 g/dL (ref 31.5–35.7)
MCV: 88 fL (ref 79–97)
Platelets: 107 10*3/uL — ABNORMAL LOW (ref 150–450)
RBC: 3.67 x10E6/uL — ABNORMAL LOW (ref 4.14–5.80)
RDW: 17.6 % — ABNORMAL HIGH (ref 11.6–15.4)
WBC: 6.7 10*3/uL (ref 3.4–10.8)

## 2022-12-06 LAB — ECHOCARDIOGRAM LIMITED
Area-P 1/2: 2.07 cm2
S' Lateral: 3.8 cm

## 2022-12-09 ENCOUNTER — Other Ambulatory Visit: Payer: Self-pay | Admitting: *Deleted

## 2022-12-09 DIAGNOSIS — I9789 Other postprocedural complications and disorders of the circulatory system, not elsewhere classified: Secondary | ICD-10-CM

## 2022-12-09 MED ORDER — RIVAROXABAN 20 MG PO TABS: 20.0000 mg | ORAL_TABLET | Freq: Every day | ORAL | 1 refills | Status: DC

## 2022-12-22 ENCOUNTER — Ambulatory Visit (HOSPITAL_COMMUNITY): Payer: PPO | Attending: Physician Assistant

## 2022-12-22 DIAGNOSIS — I9789 Other postprocedural complications and disorders of the circulatory system, not elsewhere classified: Secondary | ICD-10-CM | POA: Insufficient documentation

## 2022-12-22 DIAGNOSIS — I3139 Other pericardial effusion (noninflammatory): Secondary | ICD-10-CM | POA: Diagnosis not present

## 2022-12-22 LAB — ECHOCARDIOGRAM LIMITED
Area-P 1/2: 1.13 cm2
S' Lateral: 4 cm

## 2022-12-28 ENCOUNTER — Other Ambulatory Visit (HOSPITAL_COMMUNITY): Payer: PPO

## 2022-12-29 ENCOUNTER — Other Ambulatory Visit: Payer: Self-pay | Admitting: *Deleted

## 2022-12-29 DIAGNOSIS — Z79899 Other long term (current) drug therapy: Secondary | ICD-10-CM

## 2023-01-01 NOTE — Progress Notes (Unsigned)
Electrophysiology Office Follow up Visit Note:    Date:  01/01/2023   ID:  [name], DOB Mar 02, 1946, MRN [id number]  PCP:  [name], DO  [facility] Cardiologist:  None  [facility] Electrophysiologist:  [name], MD    Interval History:    TABITHA TUPPER is a 77 y.o. male who presents for a follow up visit.  He had a successful A-fib ablation October 04, 2022 and was discharged same day.  He developed pericarditis symptoms that ultimately led to hemorrhagic pericarditis and pericardial effusion.  He underwent a pericardiocentesis followed by pericardial window. He last saw Renee in clinic December 01, 2022.  At that appointment he reported feeling great.  He had not had any recurrent episodes of atrial fibrillation.  At the appointment with Joseph Art he was still taking amiodarone.  He was restarted on Xarelto December 09, 2022.  A limited echo on December 22, 2022 showed normal ejection fraction and no significant pericardial effusion.       Past Medical History:  Diagnosis Date   Hypertension     Past Surgical History:  Procedure Laterality Date   ATRIAL FIBRILLATION ABLATION N/A 10/04/2022   Procedure: ATRIAL FIBRILLATION ABLATION;  Surgeon: [name], MD;  Location: [place] [facility];  Service: Cardiovascular;  Laterality: N/A;   BUBBLE STUDY  06/29/2022   Procedure: BUBBLE STUDY;  Surgeon: [name], MD;  Location: [place];  Service: Cardiovascular;;   CARDIOVERSION N/A 12/28/2021   Procedure: CARDIOVERSION;  Surgeon: [name], MD;  [facility] ENDOSCOPY;  Service: Cardiovascular;  Laterality: N/A;   CIRCUMCISION     PERICARDIOCENTESIS N/A 11/08/2022   Procedure: PERICARDIOCENTESIS;  Surgeon: [name], MD;  Location: [place] [facility];  Service: Cardiovascular;  Laterality: N/A;   SUBXYPHOID PERICARDIAL WINDOW N/A 11/11/2022   Procedure: SUBXYPHOID PERICARDIAL WINDOW;  Surgeon: [name], MD;   Location: [place];  Service: Thoracic;  Laterality: N/A;   TEE WITHOUT CARDIOVERSION N/A 06/29/2022   Procedure: TRANSESOPHAGEAL ECHOCARDIOGRAM (TEE);  Surgeon: [name], MD;  Location: [facility] ENDOSCOPY;  Service: Cardiovascular;  Laterality: N/A;   TEE WITHOUT CARDIOVERSION N/A 11/11/2022   Procedure: TRANSESOPHAGEAL ECHOCARDIOGRAM (TEE);  Surgeon: [name], MD;  Location: [facility] OR;  Service: Thoracic;  Laterality: N/A;   TONSILLECTOMY AND ADENOIDECTOMY      Current Medications: No outpatient medications have been marked as taking for the 01/02/23 encounter (Appointment) with [name], MD.     Allergies:   Cheese, Bee venom, Chocolate, Cocoa, Other, and Wasp venom   Social History   Socioeconomic History   Marital status: Widowed    Spouse name: Not on file   Number of children: Not on file   Years of education: Not on file   Highest education level: Not on file  Occupational History   Occupation: Truck Geophysicist/field seismologist    Comment: Retired  Tobacco Use   Smoking status: Former    Types: Cigarettes    Quit date: 1983    Years since quitting: 41.0   Smokeless tobacco: Not on file   Tobacco comments:    Former smoker 01/11/22  Vaping Use   Vaping Use: Never used  Substance and Sexual Activity   Alcohol use: Not Currently   Drug use: Not Currently   Sexual activity: Yes  Other Topics Concern   Not on file  Social History Narrative   Not on file   Social Determinants of Health   Financial  Resource Strain: Not on file  Food Insecurity: No Food Insecurity (11/08/2022)   Hunger Vital Sign    Worried About Running Out of Food in the Last Year: Never true    Ran Out of Food in the Last Year: Never true  Transportation Needs: No Transportation Needs (11/08/2022)   PRAPARE - Hydrologist (Medical): No    Lack of Transportation (Non-Medical): No  Physical Activity: Not on file  Stress: Not on file  Social Connections: Not on file      Family History: The patient's family history includes Heart attack in his father and mother.  ROS:   Please see the history of present illness.    All other systems reviewed and are negative.  EKGs/Labs/Other Studies Reviewed:    The following studies were reviewed today:  December 22, 2022 Limited echo shows trivial pericardial effusion    Recent Labs: 11/07/2022: B Natriuretic Peptide 70.2 11/13/2022: Magnesium 1.5 12/01/2022: ALT 19; BUN 15; Creatinine, Ser 1.10; Potassium 5.0; Sodium 143; TSH 1.150 12/06/2022: Hemoglobin 11.0; Platelets 107  Recent Lipid Panel No results found for: "CHOL", "TRIG", "HDL", "CHOLHDL", "VLDL", "LDLCALC", "LDLDIRECT"  Physical Exam:    VS:  There were no vitals taken for this visit.    Wt Readings from Last 3 Encounters:  12/01/22 139 lb (63 kg)  11/13/22 140 lb 10.5 oz (63.8 kg)  10/04/22 144 lb (65.3 kg)     GEN: *** Well nourished, well developed in no acute distress CARDIAC: ***RRR, no murmurs, rubs, gallops RESPIRATORY:  Clear to auscultation without rales, wheezing or rhonchi  PSYCHIATRIC:  Normal affect        ASSESSMENT:    1. Persistent atrial fibrillation ([place])   2. Typical atrial flutter ([place])   3. Primary hypertension    PLAN:    In order of problems listed above:  #Persistent atrial fibrillation and flutter Doing well after his catheter ablation on October 04, 2022.  His postop course was complicated by hemorrhagic pericarditis requiring pericardial window on November 24.  He has done well with no recurrence in his pericardial effusion after starting Xarelto.  He remains on amiodarone.  I would like him to continue the amiodarone to complete a 82-monthcourse following his catheter ablation and then discontinue.  His last dose should be January 04, 2022.  I will have him follow-up with the APP in 3 months to reassess his rhythm.  #Hemorrhagic pericarditis Doing well after his pericardial window.  Tolerating  Xarelto.  He is currently taking colchicine 0.3 mg by mouth once daily.  I will have him continue this for 1 more month and then discontinue.  Recheck CBC today to make sure his platelet count and hemoglobin are stable.  #Hypertension *** goal today.  Recommend checking blood pressures 1-2 times per week at home and recording the values.  Recommend bringing these recordings to the primary care physician.       Medication Adjustments/Labs and Tests Ordered: Current medicines are reviewed at length with the patient today.  Concerns regarding medicines are outlined above.  No orders of the defined types were placed in this encounter.  No orders of the defined types were placed in this encounter.    Signed, CLars Mage MD, [facility]1/14/2024 7:35 PM    Electrophysiology [facility]

## 2023-01-02 ENCOUNTER — Ambulatory Visit: Payer: PPO | Attending: Cardiology | Admitting: Cardiology

## 2023-01-02 ENCOUNTER — Other Ambulatory Visit: Payer: Self-pay | Admitting: *Deleted

## 2023-01-02 ENCOUNTER — Encounter: Payer: Self-pay | Admitting: Cardiology

## 2023-01-02 ENCOUNTER — Ambulatory Visit: Payer: PPO

## 2023-01-02 VITALS — BP 110/70 | HR 49 | Ht 65.0 in | Wt 141.0 lb

## 2023-01-02 DIAGNOSIS — I483 Typical atrial flutter: Secondary | ICD-10-CM | POA: Diagnosis not present

## 2023-01-02 DIAGNOSIS — I10 Essential (primary) hypertension: Secondary | ICD-10-CM | POA: Diagnosis not present

## 2023-01-02 DIAGNOSIS — I4819 Other persistent atrial fibrillation: Secondary | ICD-10-CM

## 2023-01-02 DIAGNOSIS — Z79899 Other long term (current) drug therapy: Secondary | ICD-10-CM

## 2023-01-02 LAB — CBC
Hematocrit: 36.5 % — ABNORMAL LOW (ref 37.5–51.0)
Hemoglobin: 12.4 g/dL — ABNORMAL LOW (ref 13.0–17.7)
MCH: 30.3 pg (ref 26.6–33.0)
MCHC: 34 g/dL (ref 31.5–35.7)
MCV: 89 fL (ref 79–97)
Platelets: 92 10*3/uL — CL (ref 150–450)
RBC: 4.09 x10E6/uL — ABNORMAL LOW (ref 4.14–5.80)
RDW: 16.2 % — ABNORMAL HIGH (ref 11.6–15.4)
WBC: 6.6 10*3/uL (ref 3.4–10.8)

## 2023-01-02 NOTE — Patient Instructions (Addendum)
Medication Instructions:  Your physician has recommended you make the following change in your medication:  STOP Amiodarone 01/04/2023 STOP Colchicine 02/04/2023  *If you need a refill on your cardiac medications before your next appointment, please call your pharmacy*   Lab Work: Today: CBC If you have labs (blood work) drawn today and your tests are completely normal, you will receive your results only by: [place] (if you have MyChart) OR A paper copy in the mail If you have any lab test that is abnormal or we need to change your treatment, we will call you to review the results.   Testing/Procedures: None ordered   Follow-Up: At [facility], you and your health needs are our priority.  As part of our continuing mission to provide you with exceptional heart care, we have created designated Provider Care Teams.  These Care Teams include your primary Cardiologist (physician) and Advanced Practice Providers (APPs -  Physician Assistants and Nurse Practitioners) who all work together to provide you with the care you need, when you need it.  We recommend signing up for the patient portal called "MyChart".  Sign up information is provided on this After Visit Summary.  MyChart is used to connect with patients for Virtual Visits (Telemedicine).  Patients are able to view lab/test results, encounter notes, upcoming appointments, etc.  Non-urgent messages can be sent to your provider as well.   To learn more about what you can do with MyChart, go to [email or web address].    Your next appointment:   3 month(s)  The format for your next appointment:   In Person  Provider:   You will see one of the following Advanced Practice Providers on your designated Care Team:   Tommye Standard, [place] Legrand Como "Jonni Sanger" Chalmers Cater, [place]   Thank you for choosing [facility]!!   [phone]  Other Instructions

## 2023-01-02 NOTE — Progress Notes (Signed)
Electrophysiology Office Follow up Visit Note:    Date:  01/02/2023   ID:  Robert Stark, DOB 02-Dec-1946, MRN [id number]  PCP:  [name], DO  [facility] Cardiologist:  None  [facility] Electrophysiologist:  [name], MD    Interval History:    Robert Stark is a 77 y.o. male who presents for a follow up visit.  He had a successful A-fib ablation October 04, 2022 and was discharged same day.  He developed pericarditis symptoms that ultimately led to hemorrhagic pericarditis and pericardial effusion.  He underwent a pericardiocentesis followed by pericardial window. He last saw Renee in clinic December 01, 2022.  At that appointment he reported feeling great.  He had not had any recurrent episodes of atrial fibrillation.  At the appointment with Joseph Art he was still taking amiodarone.  He was restarted on Xarelto December 09, 2022.  A limited echo on December 22, 2022 showed normal ejection fraction and no significant pericardial effusion.  Today, his EKG shows normal rhythm. He takes half a tab of Colchicine 0.6 MG once a day. His recent Echo shows no fluid around the heart. For exercise, he works out 3 days a week and stays active at his workplace.  He denies any chest pain, shortness of breath, or peripheral edema. No lightheadedness, headaches, syncope, orthopnea, or PND.      Past Medical History:  Diagnosis Date   Hypertension     Past Surgical History:  Procedure Laterality Date   ATRIAL FIBRILLATION ABLATION N/A 10/04/2022   Procedure: ATRIAL FIBRILLATION ABLATION;  Surgeon: [name], MD;  Location: [place] [facility];  Service: Cardiovascular;  Laterality: N/A;   BUBBLE STUDY  06/29/2022   Procedure: BUBBLE STUDY;  Surgeon: [name], MD;  Location: [place];  Service: Cardiovascular;;   CARDIOVERSION N/A 12/28/2021   Procedure: CARDIOVERSION;  Surgeon: [name], MD;  Location: [facility] ENDOSCOPY;  Service:  Cardiovascular;  Laterality: N/A;   CIRCUMCISION     PERICARDIOCENTESIS N/A 11/08/2022   Procedure: PERICARDIOCENTESIS;  Surgeon: [name], MD;  Location: [place] [facility];  Service: Cardiovascular;  Laterality: N/A;   SUBXYPHOID PERICARDIAL WINDOW N/A 11/11/2022   Procedure: SUBXYPHOID PERICARDIAL WINDOW;  Surgeon: [name], MD;  Location: [place];  Service: Thoracic;  Laterality: N/A;   TEE WITHOUT CARDIOVERSION N/A 06/29/2022   Procedure: TRANSESOPHAGEAL ECHOCARDIOGRAM (TEE);  Surgeon: [name], MD;  Location: [facility] ENDOSCOPY;  Service: Cardiovascular;  Laterality: N/A;   TEE WITHOUT CARDIOVERSION N/A 11/11/2022   Procedure: TRANSESOPHAGEAL ECHOCARDIOGRAM (TEE);  Surgeon: [name], MD;  Location: [facility] OR;  Service: Thoracic;  Laterality: N/A;   TONSILLECTOMY AND ADENOIDECTOMY      Current Medications: Current Meds  Medication Sig   amiodarone (PACERONE) 200 MG tablet Take 1 tablet (200 mg total) by mouth daily.   atorvastatin (LIPITOR) 40 MG tablet Take 40 mg by mouth daily.   azelastine (ASTELIN) 0.1 % nasal spray Place 1 spray into both nostrils 2 (two) times daily.   colchicine 0.6 MG tablet Take 1 tablet (0.6 mg total) by mouth daily.   colchicine 0.6 MG tablet Take 0.5 tablets (0.3 mg total) by mouth daily.   losartan-hydrochlorothiazide (HYZAAR) 100-12.5 MG tablet Take 1 tablet by mouth daily.   metoprolol succinate (TOPROL-XL) 50 MG 24 hr tablet Take 50 mg by mouth 2 (two) times daily.   rivaroxaban (XARELTO) 20 MG TABS tablet Take 1 tablet (20 mg total) by mouth daily with  supper.   tadalafil (CIALIS) 5 MG tablet Take 5 mg by mouth daily as needed for erectile dysfunction.   tamsulosin (FLOMAX) 0.4 MG CAPS capsule Take 0.4 mg by mouth daily. For prostate     Allergies:   Cheese, Bee venom, Chocolate, Cocoa, Other, and Wasp venom   Social History   Socioeconomic History   Marital status: Widowed    Spouse name: Not on file   Number of children:  Not on file   Years of education: Not on file   Highest education level: Not on file  Occupational History   Occupation: Truck Geophysicist/field seismologist    Comment: Retired  Tobacco Use   Smoking status: Former    Types: Cigarettes    Quit date: 1983    Years since quitting: 41.0   Smokeless tobacco: Not on file   Tobacco comments:    Former smoker 01/11/22  Vaping Use   Vaping Use: Never used  Substance and Sexual Activity   Alcohol use: Not Currently   Drug use: Not Currently   Sexual activity: Yes  Other Topics Concern   Not on file  Social History Narrative   Not on file   Social Determinants of Health   Financial Resource Strain: Not on file  Food Insecurity: No Food Insecurity (11/08/2022)   Hunger Vital Sign    Worried About Running Out of Food in the Last Year: Never true    Ran Out of Food in the Last Year: Never true  Transportation Needs: No Transportation Needs (11/08/2022)   PRAPARE - Hydrologist (Medical): No    Lack of Transportation (Non-Medical): No  Physical Activity: Not on file  Stress: Not on file  Social Connections: Not on file     Family History: The patient's family history includes Heart attack in his father and mother.  ROS:   Please see the history of present illness.     All other systems reviewed and are negative.  EKGs/Labs/Other Studies Reviewed:    The following studies were reviewed today:  December 22, 2022 Limited echo shows trivial pericardial effusion  EKG: EKG is personally reviewed. 01/02/2023: Sinus rhythm.  Ventricular rate 49 bpm.  Recent Labs: 11/07/2022: B Natriuretic Peptide 70.2 11/13/2022: Magnesium 1.5 12/01/2022: ALT 19; BUN 15; Creatinine, Ser 1.10; Potassium 5.0; Sodium 143; TSH 1.150 12/06/2022: Hemoglobin 11.0; Platelets 107  Recent Lipid Panel No results found for: "CHOL", "TRIG", "HDL", "CHOLHDL", "VLDL", "LDLCALC", "LDLDIRECT"  Physical Exam:    VS:  BP 110/70   Pulse (!) 49   Ht 5'  5" (1.651 m)   Wt 141 lb (64 kg)   SpO2 96%   BMI 23.46 kg/m     Wt Readings from Last 3 Encounters:  01/02/23 141 lb (64 kg)  12/01/22 139 lb (63 kg)  11/13/22 140 lb 10.5 oz (63.8 kg)     GEN:  Well nourished, well developed in no acute distress CARDIAC: RRR, no murmurs, rubs, gallops.  Subxiphoid incision healing well RESPIRATORY:  Clear to auscultation without rales, wheezing or rhonchi  PSYCHIATRIC:  Normal affect        ASSESSMENT:    1. Persistent atrial fibrillation ([place])   2. Typical atrial flutter ([place])   3. Primary hypertension    PLAN:    In order of problems listed above:  #Persistent atrial fibrillation and flutter Doing well after his catheter ablation on October 04, 2022.  His postop course was complicated by hemorrhagic pericarditis requiring pericardial  window on November 24.  He has done well with no recurrence in his pericardial effusion after starting Xarelto.  He remains on amiodarone.  I would like him to continue the amiodarone to complete a 67-monthcourse following his catheter ablation and then discontinue.  His last dose should be January 04, 2022.  I will have him follow-up with the APP in 3 months to reassess his rhythm.    #Hemorrhagic pericarditis Doing well after his pericardial window.  Tolerating Xarelto.  He is currently taking colchicine 0.3 mg by mouth once daily.  I will have him continue this for 1 more month and then discontinue after February 17.  Recheck CBC today to make sure his platelet count and hemoglobin are stable.  #Hypertension At goal today.  Recommend checking blood pressures 1-2 times per week at home and recording the values.  Recommend bringing these recordings to the primary care physician.    Follow-up 3 months with APP.    Medication Adjustments/Labs and Tests Ordered: Current medicines are reviewed at length with the patient today.  Concerns regarding medicines are outlined above.  No orders of the defined  types were placed in this encounter.  No orders of the defined types were placed in this encounter.   I,Mitra Faeizi,acting as a sEducation administratorfor CVickie Epley MD.,have documented all relevant documentation on the behalf of CVickie Epley MD,as directed by  CVickie Epley MD while in the presence of CVickie Epley MD.  I, CVickie Epley MD, have reviewed all documentation for this visit. The documentation on 01/02/23 for the exam, diagnosis, procedures, and orders are all accurate and complete.   Signed, CLars Mage MD, F[facility] F[facility]1/15/2024 11:48 AM    Electrophysiology [facility]

## 2023-01-02 NOTE — Addendum Note (Signed)
Addended by: Stanton Kidney on: 01/02/2023 12:07 PM   Modules accepted: Orders

## 2023-04-06 ENCOUNTER — Encounter: Payer: Self-pay | Admitting: Student

## 2023-04-06 ENCOUNTER — Ambulatory Visit: Payer: PPO | Attending: Student | Admitting: Student

## 2023-04-06 VITALS — BP 146/74 | HR 46 | Ht 66.0 in | Wt 142.0 lb

## 2023-04-06 DIAGNOSIS — I9789 Other postprocedural complications and disorders of the circulatory system, not elsewhere classified: Secondary | ICD-10-CM | POA: Diagnosis not present

## 2023-04-06 DIAGNOSIS — I3139 Other pericardial effusion (noninflammatory): Secondary | ICD-10-CM

## 2023-04-06 DIAGNOSIS — I10 Essential (primary) hypertension: Secondary | ICD-10-CM

## 2023-04-06 DIAGNOSIS — I4819 Other persistent atrial fibrillation: Secondary | ICD-10-CM

## 2023-04-06 NOTE — Patient Instructions (Signed)
Medication Instructions:    Your physician recommends that you continue on your current medications as directed. Please refer to the Current Medication list given to you today.  *If you need a refill on your cardiac medications before your next appointment, please call your pharmacy*   Lab Work: NONE ORDERED  TODAY   If you have labs (blood work) drawn today and your tests are completely normal, you will receive your results only by: MyChart Message (if you have MyChart) OR A paper copy in the mail If you have any lab test that is abnormal or we need to change your treatment, we will call you to review the results.   Testing/Procedures: NONE ORDERED  TODAY     Follow-Up: At [facility], you and your health needs are our priority.  As part of our continuing mission to provide you with exceptional heart care, we have created designated Provider Care Teams.  These Care Teams include your primary Cardiologist (physician) and Advanced Practice Providers (APPs -  Physician Assistants and Nurse Practitioners) who all work together to provide you with the care you need, when you need it.  We recommend signing up for the patient portal called "MyChart".  Sign up information is provided on this After Visit Summary.  MyChart is used to connect with patients for Virtual Visits (Telemedicine).  Patients are able to view lab/test results, encounter notes, upcoming appointments, etc.  Non-urgent messages can be sent to your provider as well.   To learn more about what you can do with MyChart, go to [email or web address].    Your next appointment:   3 month(s)  Provider:   Casimiro Needle "Otilio Saber, PA-C    Other Instructions

## 2023-04-06 NOTE — Progress Notes (Signed)
  Electrophysiology Office Note:   Date:  04/06/2023  ID:  Robert Stark, DOB 25-Feb-1946, MRN [id number]  Primary Cardiologist: None Electrophysiologist: Lanier Prude, MD   History of Present Illness:   Robert Stark is a 77 y.o. male with h/o AF s/p ablation 09/2022 with post op course complicated by hemorrhagic pericarditis and pericardial effusion, s/p pericardial window, and HTN seen today for routine electrophysiology followup.   Since last being seen in our clinic the patient reports doing very well. He has not had any issues. He is very unsure of his medications. He thinks he may be on a second blood thinner, and thinks he is still taking colchicine. Encouraged to bring medications to future visits. HR low on exam today. Reports getting up into the 50-70s with activity, that he is very active, and has had no SOB, dizziness, lightheadedness, syncope, or near syncope.   Review of systems complete and found to be negative unless listed in HPI.   Studies Reviewed:    EKG is ordered today. Personal review shows sinus brady at 46 bpm   Physical Exam:   VS:  BP (!) 146/74   Pulse (!) 46   Ht 5\' 6"  (1.676 m)   Wt 142 lb (64.4 kg)   SpO2 97%   BMI 22.92 kg/m    Wt Readings from Last 3 Encounters:  04/06/23 142 lb (64.4 kg)  01/02/23 141 lb (64 kg)  12/01/22 139 lb (63 kg)     GEN: Well nourished, well developed in no acute distress NECK: No JVD; No carotid bruits CARDIAC: Slow, but regular rate and rhythm, no murmurs, rubs, gallops RESPIRATORY:  Clear to auscultation without rales, wheezing or rhonchi  ABDOMEN: Soft, non-tender, non-distended EXTREMITIES:  No edema; No deformity   ASSESSMENT AND PLAN:    Persistent atrial fibrillation and flutter Doing well s/p ablation 09/2022 Had at least one episode of tachycardia, but resolved after an extra half of toprol, and has since been on 75 mg BID Off amiodarone Off colchicine.  Continue Xarelto    Hemorrhagic  pericarditis Denies chest pain or pericarditic symptoms.     HTN Stable on current regimen   Sinus bradycardia Thus far asymptomatic, and reports HR response to exercise.  Continue to monitor  Follow up with Dr. Lalla Brothers in 3 months  Signed, Graciella Freer, PA-C

## 2023-04-07 ENCOUNTER — Other Ambulatory Visit: Payer: Self-pay | Admitting: Cardiology

## 2023-04-27 ENCOUNTER — Other Ambulatory Visit: Payer: Self-pay | Admitting: Physician Assistant

## 2023-07-05 NOTE — Progress Notes (Deleted)
  Electrophysiology Office Note:   Date:  07/05/2023  ID:  Robert Stark, DOB 07/12/1946, MRN [id number]  Primary Cardiologist: None Electrophysiologist: Lanier Prude, MD  {Click to update primary MD,subspecialty MD or APP then REFRESH:1}    History of Present Illness:   Robert Stark is a 77 y.o. male with h/o AF s/p ablation 09/2022 with post op course complicated by hemorrhagic pericarditis and pericardial effusion, s/p pericardial window, and HTN  seen today for {VISITTYPE:28148}  Review of systems complete and found to be negative unless listed in HPI.   EP Information / Studies Reviewed:    EKG is ordered today. Personal review as below.       ***  Risk Assessment/Calculations:   {Does this patient have ATRIAL FIBRILLATION?[phone]} No BP recorded.  {Refresh Note OR Click here to enter BP  :1}***        Physical Exam:   VS:  There were no vitals taken for this visit.   Wt Readings from Last 3 Encounters:  04/06/23 142 lb (64.4 kg)  01/02/23 141 lb (64 kg)  12/01/22 139 lb (63 kg)     GEN: Well nourished, well developed in no acute distress NECK: No JVD; No carotid bruits CARDIAC: {EPRHYTHM:28826}, no murmurs, rubs, gallops RESPIRATORY:  Clear to auscultation without rales, wheezing or rhonchi  ABDOMEN: Soft, non-tender, non-distended EXTREMITIES:  No edema; No deformity   ASSESSMENT AND PLAN:    Persistent atrial fibrillation and flutter Doing well s/p ablation 09/2022 Had at least one episode of tachycardia, but resolved after an extra half of toprol, and has since been on 75 mg BID Off amiodarone Off colchicine.  Continue Xarelto    Hemorrhagic pericarditis Denies chest pain or pericarditic symptoms.     HTN Stable on current regimen    Sinus bradycardia Thus far asymptomatic, and reports HR response to exercise.  Continue to monitor  {Click here to Review PMH, Prob List, Meds, Allergies, SHx, FHx  :1}   Follow up with {PPIRJ:18841}  {EPFOLLOW YS:06301}  Signed, Graciella Freer, PA-C

## 2023-07-06 ENCOUNTER — Encounter: Payer: Self-pay | Admitting: Student

## 2023-07-06 ENCOUNTER — Ambulatory Visit: Payer: PPO | Attending: Student | Admitting: Student

## 2023-07-06 DIAGNOSIS — I483 Typical atrial flutter: Secondary | ICD-10-CM

## 2023-07-06 DIAGNOSIS — I10 Essential (primary) hypertension: Secondary | ICD-10-CM

## 2023-07-06 DIAGNOSIS — R001 Bradycardia, unspecified: Secondary | ICD-10-CM

## 2023-07-06 DIAGNOSIS — I9789 Other postprocedural complications and disorders of the circulatory system, not elsewhere classified: Secondary | ICD-10-CM

## 2023-07-06 DIAGNOSIS — I4819 Other persistent atrial fibrillation: Secondary | ICD-10-CM

## 2023-12-04 ENCOUNTER — Other Ambulatory Visit: Payer: Self-pay | Admitting: Physician Assistant

## 2024-01-09 ENCOUNTER — Other Ambulatory Visit: Payer: Self-pay | Admitting: Physician Assistant

## 2024-01-09 DIAGNOSIS — I4819 Other persistent atrial fibrillation: Secondary | ICD-10-CM

## 2024-01-09 NOTE — Telephone Encounter (Signed)
Xarelto 20mg  refill request received. Pt is 78 years old, weight-64.4kg, Crea-1.11 on 06/13/23 via  Care Everywhere from [facility], last seen by Otilio Saber on 04/06/23, Diagnosis-Afib, CrCl-51.57 mL/min; Dose is appropriate based on dosing criteria. Will send in refill to requested pharmacy.

## 2024-03-24 NOTE — Progress Notes (Deleted)
 Cardiology Office Note Date:  03/24/2024  Patient ID:  Robert Stark, Robert Stark [birth year], MRN [id number] PCP:  Montez Hageman, DO  Electrophysiologist: Dr. Lalla Brothers     Chief Complaint: *** over due  History of Present Illness: [name] is a 78 y.o. 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  He was referred to AFib clinic and eventually EP for rhythm control options. Ultimately underwent AFib ablation 10/04/22, discharged same day. NOTED that at the time of his ablation ICE exam showed stable LV function and no change in the baseline pericardial effusion   Admitted 11/07/22 He did quite well for the initial couple weeks though began to feel progressively SOB, some orthostatic dizziness developed and went to the ER yesterday, sats 82% on RA, TTE noted large pericardial effusion  with fibrinous strands and all the typical echo findings of tamponade, though no hypotension, was in SR. Renal function significantly worsened from baseline. With stable BP, his a/c held and planned to pursue pericardial drain the next day He had pericardiocentesis this morning with drain placement, removing 940cc of bloody fluid. Post procedure developed rapid Afib and started on amiodarone gtt Dspite drain, f/u echo woth persistent large pericardial effusion Started on colchicine, and planned for window >> done 11/11/22. Developed some SB and amiodarone reduced to 200mg  daily, LVEF 45-5-%, not volume OL. pericardial effusion is largely gone by thickened pericardium remained. Renal function improved significantly and colchicine dose increased to 0.6mg  daily Home metoprolol succinate was discontinued due to bradycardia on amiodarone.  Home losartan-HCTZ has been discontinued as his blood pressure has been normal without blood pressure medication.   Xarelto has been temporarily discontinued with plan to hopefully restart as outpatient on the next follow-up if pericardial effusion does not come back and the thickened pericardium improve on the next echocardiogram. Discharged 11/13/22  He did no show to his CTS f/u appt on 11/28/22  TTE 11/23/22: LVEF remained 45-50% Trivial pericardial effusion. Trivial pericardial effusion is  present. The pericardial effusion is circumferential. There is no evidence  of cardiac tamponade.   I saw him 12/01/22,  He feels "Randie Heinz!" Denies any CP, palpitations or cardiac awareness He has been checking his pulse and he's been "nice and steady" No SOB, DOE. No dizzy spells, near syncope or syncope. Off a/c, planned for f/u echo  Seeing Dr. Lalla Brothers and Mardelle Matte a couple times since then Back on [facility] Planned to stop amiodarone Jan 2024  Most recent with Mardelle Matte on 04/06/23, feeling well, confusion about his meds, SB 46 this visit, pt reports mostly 50's > 70s with exertion No symptoms of brady, no changes made  TODAY  *** xarelto, dose, bleeding *** symptoms > brady?  Effusion? *** AF? *** volume? CM meds?  EF is recovered  Arrhythmia/AAD hx AFib found 2022 Tachy-mediated CHF/CM  AFib ablation 10/04/22  Admitted Nov 2023 large hemorrhagic pericardial effusion Treated with pericardial tap/drain Colchicine Amiodarone with recurrent AFib/RVR >>> Stopped Jan 2024  Past Medical History:  Diagnosis Date   Hypertension     Past Surgical History:  Procedure Laterality Date   ATRIAL FIBRILLATION ABLATION N/A 10/04/2022   Procedure: ATRIAL FIBRILLATION ABLATION;  Surgeon: Lanier Prude, MD;  Location: [facility] INVASIVE CV LAB;  Service: Cardiovascular;  Laterality: N/A;   BUBBLE STUDY  06/29/2022   Procedure: BUBBLE STUDY;  Surgeon:  Meriam Sprague, MD;  Location: [facility] ENDOSCOPY;  Service: Cardiovascular;;   CARDIOVERSION N/A 12/28/2021   Procedure: CARDIOVERSION;  Surgeon: Pricilla Riffle, MD;   Location: [facility] ENDOSCOPY;  Service: Cardiovascular;  Laterality: N/A;   CIRCUMCISION     PERICARDIOCENTESIS N/A 11/08/2022   Procedure: PERICARDIOCENTESIS;  Surgeon: Corky Crafts, MD;  Location: [facility] INVASIVE CV LAB;  Service: Cardiovascular;  Laterality: N/A;   SUBXYPHOID PERICARDIAL WINDOW N/A 11/11/2022   Procedure: SUBXYPHOID PERICARDIAL WINDOW;  Surgeon: Eugenio Hoes, MD;  Location: [facility] OR;  Service: Thoracic;  Laterality: N/A;   TEE WITHOUT CARDIOVERSION N/A 06/29/2022   Procedure: TRANSESOPHAGEAL ECHOCARDIOGRAM (TEE);  Surgeon: Meriam Sprague, MD;  Location: [facility] ENDOSCOPY;  Service: Cardiovascular;  Laterality: N/A;   TEE WITHOUT CARDIOVERSION N/A 11/11/2022   Procedure: TRANSESOPHAGEAL ECHOCARDIOGRAM (TEE);  Surgeon: Eugenio Hoes, MD;  Location: [facility] OR;  Service: Thoracic;  Laterality: N/A;   TONSILLECTOMY AND ADENOIDECTOMY      Current Outpatient Medications  Medication Sig Dispense Refill   atorvastatin (LIPITOR) 40 MG tablet Take 40 mg by mouth daily.     azelastine (ASTELIN) 0.1 % nasal spray Place 1 spray into both nostrils 2 (two) times daily.     losartan-hydrochlorothiazide (HYZAAR) 100-12.5 MG tablet Take 1 tablet by mouth daily.     metoprolol succinate (TOPROL-XL) 50 MG 24 hr tablet Take 1 tablet (50 mg total) by mouth 2 (two) times daily. 180 tablet 3   tadalafil (CIALIS) 5 MG tablet Take 5 mg by mouth daily as needed for erectile dysfunction.     tamsulosin (FLOMAX) 0.4 MG CAPS capsule Take 0.4 mg by mouth daily. For prostate     XARELTO 20 MG TABS tablet TAKE 1 TABLET BY MOUTH EVERY DAY WITH SUPPER 90 tablet 1   No current facility-administered medications for this visit.    Allergies:   Cheese, Bee venom, Chocolate, Cocoa, Other, and Wasp venom   Social History:  The patient  reports that he quit smoking about 42 years ago. His smoking use included cigarettes. He does not have any smokeless tobacco history on file. He reports that he does not currently use  alcohol. He reports that he does not currently use drugs.   Family History:  The patient's family history includes Heart attack in his father and mother.  ROS:  Please see the history of present illness.    All other systems are reviewed and otherwise negative.   PHYSICAL EXAM:  VS:  There were no vitals taken for this visit. BMI: There is no height or weight on file to calculate BMI. Well nourished, well developed, in no acute distress HEENT: normocephalic, atraumatic Neck: no JVD, carotid bruits or masses Cardiac: *** RRR; no significant murmurs, no rubs, or gallops Lungs: *** CTA b/l, no wheezing, rhonchi or rales Abd: soft, nontender MS: no deformity or atrophy Ext: *** no edema Skin: warm and dry, no rash Neuro:  No gross deficits appreciated Psych: euthymic mood, full affect   EKG:  not done today  12/22/22: TTE 1. Left ventricular ejection fraction, by estimation, is 60 to 65%. The  left ventricle has normal function.   2. Right ventricular systolic function is normal. The right ventricular  size is normal. There is normal pulmonary artery systolic pressure.   3. Left atrial size was moderately dilated.   4. Right atrial size was moderately dilated.   5. The aortic valve is grossly normal. Aortic valve regurgitation is not  visualized.   6. The inferior  vena cava is normal in size with greater than 50%  respiratory variability, suggesting right atrial pressure of 3 mmHg.   Comparison(s): No significant change from prior study.   Pericardium: There is no evidence of pericardial effusion.   11/23/22: TTE 1. Trivial pericardial effusion. There is no evidence of cardiac  tamponade.   2. Left ventricular ejection fraction, by estimation, is 45 to 50%. The  left ventricle has mildly decreased function. The left ventricle has no  regional wall motion abnormalities. The left ventricular internal cavity  size was mildly dilated.   3. Right ventricular systolic function is  low normal. The right  ventricular size is normal. There is normal pulmonary artery systolic  pressure. The estimated right ventricular systolic pressure is 27.8 mmHg.   4. The mitral valve is grossly normal. Trivial mitral valve  regurgitation. No evidence of mitral stenosis.   5. The inferior vena cava is normal in size with greater than 50%  respiratory variability, suggesting right atrial pressure of 3 mmHg.   Comparison(s): No significant change from prior study.    11/09/22: TTE 1. Left ventricular ejection fraction, by estimation, is 40 to 45%. The  left ventricle has mildly decreased function. The left ventricle has no  regional wall motion abnormalities.   2. Right ventricular systolic function is mildly reduced. The right  ventricular size is normal.   3. No RA or RV collapse. There is no respirophasic of mitral valve inflow  velocities. 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 Large pericardial effusion. The  pericardial effusion is surrounding the apex and circumferential. There is  no evidence of cardiac tamponade.   4. The mitral valve is normal in structure. No evidence of mitral valve  regurgitation. No evidence of mitral stenosis.   5. The aortic valve is tricuspid. Aortic valve regurgitation is not  visualized. No aortic stenosis is present.   6. The inferior vena cava is dilated in size with <50% respiratory  variability, suggesting right atrial pressure of 15 mmHg.   Comparison(s): Prior images reviewed side by side. LV is less vigorous  than prior.    11/08/22: limited echo 1. Large pericardial effusion. The pericardial effusion is surrounding  the apex. There RV does not fully expand. From 8:10 AM to 8:32 AM (last  image) improvedment in size of effusion. Still severe effusion at last  image.  Comparison(s): Prior images reviewed side by side. Effusion size has  improved.   FINDINGS   Pericardium: A large pericardial effusion is present. The pericardial  effusion is  surrounding the apex. There is diastolic collapse of the right  ventricular free wall.    11/07/22: TTE 1. Left ventricular ejection fraction, by estimation, is 35 to 40%. The  left ventricle has moderately decreased function. The left ventricle  demonstrates global hypokinesis. Left ventricular diastolic parameters are  indeterminate.   2. Right ventricular systolic function is normal. The right ventricular  size is normal. Tricuspid regurgitation signal is inadequate for assessing  PA pressure.   3. Left atrial size was mildly dilated.   4. Large pericardial effusion. The pericardial effusion is  circumferential. Findings are consistent with cardiac tamponade.   5. The mitral valve is normal in structure. Trivial mitral valve  regurgitation. No evidence of mitral stenosis.   6. The aortic valve is normal in structure. Aortic valve regurgitation is  not visualized. No aortic stenosis is present.   7. There is borderline dilatation of the aortic root, measuring 39 mm.   8. The  inferior vena cava is dilated in size with <50% respiratory  variability, suggesting right atrial pressure of 15 mmHg.   Comparison(s): A prior study was performed on 12/07/2021. The left  ventricular function is unchanged. The previous study showed a small  pericardial effusion anterior to the right ventricle. The effusion is now  large with findings of tamponade.    10/04/22 EPS/ablation CONCLUSIONS: 1. Successful PVI 2. Successful ablation/isolation of the posterior wall 3. Intracardiac echo reveals trivial pericardial effusion, left sided common PV ostium, large mobile eustachian valve. 4. No early apparent complications. 5. Protonix 40mg  PO daily x 45 days 6. Colchicine 0.6mg  PO BID x 5 days   (NOTE: Due to the large mobile eustachian valve,  elected not to ablate the CTI).    06/29/22: TEE 1. Left ventricular ejection fraction, by estimation, is 25 to 30%. The  left ventricle has severely decreased  function.   2. Right ventricular systolic function is mildly reduced. The right  ventricular size is normal.   3. Left atrial size was moderately dilated. No left atrial/left atrial  appendage thrombus was detected.   4. Right atrial size was severely dilated.   5. The mitral valve is normal in structure. Trivial mitral valve  regurgitation.   6. The aortic valve is tricuspid. Aortic valve regurgitation is trivial.   7. There is mild (Grade II) plaque.   8. Agitated saline contrast bubble study was negative, with no evidence  of any interatrial shunt.     Recent Labs: No results found for requested labs within last 365 days.  No results found for requested labs within last 365 days.   CrCl cannot be calculated (Patient's most recent lab result is older than the maximum 21 days allowed.).   Wt Readings from Last 3 Encounters:  04/06/23 142 lb (64.4 kg)  01/02/23 141 lb (64 kg)  12/01/22 139 lb (63 kg)     Other studies reviewed: Additional studies/records reviewed today include: summarized above  ASSESSMENT AND PLAN:  Persistent AFib CHA2DS2Vasc is 3, on Xarelto, *** appropriately dosed ***   2.   Pericardial effusion w/tamponade S/p pericardicentesis/drain> window Felt to have been hemorrhagic pericarditis Stable f/u echo ***   3.   HTN *** Looks good  4. Mild CM recovered with maintenance of SR ***   Disposition: ***   Current medicines are reviewed at length with the patient today.  The patient did not have any concerns regarding medicines.  Norma Fredrickson, PA-C 03/24/2024 11:55 AM     [facility] [address] [place] [address] [phone] (office)  [phone] (fax)

## 2024-03-26 ENCOUNTER — Ambulatory Visit: Payer: PPO | Admitting: Physician Assistant

## 2024-03-26 ENCOUNTER — Ambulatory Visit: Payer: PPO | Admitting: Cardiovascular Disease

## 2024-05-24 ENCOUNTER — Other Ambulatory Visit: Payer: Self-pay | Admitting: Cardiology

## 2024-05-29 NOTE — Telephone Encounter (Signed)
See Rx request ° °

## 2024-05-29 NOTE — Telephone Encounter (Signed)
Left msg for pt to return call to discuss.

## 2024-05-31 NOTE — Telephone Encounter (Signed)
 Left another msg for pt to return call to clarify how he is taking this medication.

## 2024-06-05 NOTE — Telephone Encounter (Signed)
 Left another msg for pt to return call to discuss.

## 2024-07-01 NOTE — Progress Notes (Signed)
 Electrophysiology Office Note:   Date:  07/09/2024  ID:  Robert Stark, DOB 1945-12-24, MRN [id number]  Primary Cardiologist: None Primary Heart Failure: None Electrophysiologist: OLE ONEIDA HOLTS, MD      History of Present Illness:   Robert Stark is a 78 y.o. male with h/o AF with post-op course complicated by hemorrhagic pericarditis with pericardial effusion s/p pericardial window, HTN, HFmrEF, HLD, malnutrition, glioblastoma and seizures seen today for routine electrophysiology followup.   Seen in ER [facility]/20/24 at [facility] for at least 2 month hx of muscle fasciculations / spasms on his right side, headache and diagnosed with a brain mass.   Since last being seen in our clinic the patient reports he was diagnosed with a brain mass and may need to have surgery soon.  He was started on medications which has stopped his arm activity. He     He denies chest pain, palpitations, dyspnea, PND, orthopnea, nausea, vomiting, dizziness, syncope, edema, weight gain, or early satiety.   Review of systems complete and found to be negative unless listed in HPI.   EP Information / Studies Reviewed:    EKG is ordered today. Personal review as below.    SR with PAC's, 65 bpm   Arrhythmia / AAD AF  EPS 10/04/22 > PVI ablation, posterior wall ablation    Risk Assessment/Calculations:    CHA2DS2-VASc Score = 4   This indicates a 4.8% annual risk of stroke. The patient's score is based upon: CHF History: 1 HTN History: 1 Diabetes History: 0 Stroke History: 0 Vascular Disease History: 0 Age Score: 2 Gender Score: 0             Physical Exam:   VS:  BP 129/70   Pulse 65   Ht 5' 6 (1.676 m)   Wt 145 lb (65.8 kg)   SpO2 98%   BMI 23.40 kg/m    Wt Readings from Last 3 Encounters:  07/09/24 145 lb (65.8 kg)  07/09/24 145 lb (65.8 kg)  04/06/23 142 lb (64.4 kg)     GEN: Well nourished, well developed in no acute distress NECK: No JVD; No carotid bruits CARDIAC: Regular  rate and rhythm, no murmurs, rubs, gallops RESPIRATORY:  Clear to auscultation without rales, wheezing or rhonchi  ABDOMEN: Soft, non-tender, non-distended EXTREMITIES:  No edema; No deformity   ASSESSMENT AND PLAN:    Persistent Atrial Fibrillation  Atrial Flutter  CHA2DS2-VASc 4, s/p ablation 09/2022  -OAC for stroke prophylaxis  -all meds were stopped after hospital visit for brain mass / seizure  -no AF burden per patient   -Reviewed with Dr. Francyne, plans for implant of loop recorder on 07/18/24 pending pre-cert for AF monitoring    -Toprol  50 mg was paused at discharge from 04/2024 > would favor restarting BB pending ECHO results (see below)  Secondary Hypercoagulable State  -Xarelto  on hold given brain mass > notes from [facility] show can revisit resuming pending clinical course and resolution of seizure activity   -assess BMP to ensure appropriately dosed OAC    CHF -per Dr. Croitoru  -ECHO pending  -per Dr. 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 HOLTS in 6 months  Signed, Daphne Barrack, NP-C, AGACNP-BC [facility] - Electrophysiology  07/09/2024, 10:52 AM

## 2024-07-09 ENCOUNTER — Encounter: Payer: Self-pay | Admitting: Cardiovascular Disease

## 2024-07-09 ENCOUNTER — Ambulatory Visit (INDEPENDENT_AMBULATORY_CARE_PROVIDER_SITE_OTHER): Admitting: Pulmonary Disease

## 2024-07-09 ENCOUNTER — Encounter: Payer: Self-pay | Admitting: Pulmonary Disease

## 2024-07-09 ENCOUNTER — Ambulatory Visit: Attending: Cardiology | Admitting: Cardiovascular Disease

## 2024-07-09 VITALS — BP 129/70 | HR 65 | Ht 66.0 in | Wt 145.0 lb

## 2024-07-09 DIAGNOSIS — I4819 Other persistent atrial fibrillation: Secondary | ICD-10-CM

## 2024-07-09 DIAGNOSIS — I483 Typical atrial flutter: Secondary | ICD-10-CM

## 2024-07-09 DIAGNOSIS — I9789 Other postprocedural complications and disorders of the circulatory system, not elsewhere classified: Secondary | ICD-10-CM

## 2024-07-09 DIAGNOSIS — D6869 Other thrombophilia: Secondary | ICD-10-CM

## 2024-07-09 DIAGNOSIS — I428 Other cardiomyopathies: Secondary | ICD-10-CM

## 2024-07-09 DIAGNOSIS — I10 Essential (primary) hypertension: Secondary | ICD-10-CM

## 2024-07-09 DIAGNOSIS — G9389 Other specified disorders of brain: Secondary | ICD-10-CM

## 2024-07-09 NOTE — Patient Instructions (Addendum)
 Medication Instructions:  Your physician recommends that you continue on your current medications as directed. Please refer to the Current Medication list given to you today.  *If you need a refill on your cardiac medications before your next appointment, please call your pharmacy*  Lab Work: BMET-TODAY If you have labs (blood work) drawn today and your tests are completely normal, you will receive your results only by: MyChart Message (if you have MyChart) OR A paper copy in the mail If you have any lab test that is abnormal or we need to change your treatment, we will call you to review the results.  Follow-Up: At [facility], you and your health needs are our priority.  As part of our continuing mission to provide you with exceptional heart care, our providers are all part of one team.  This team includes your primary Cardiologist (physician) and Advanced Practice Providers or APPs (Physician Assistants and Nurse Practitioners) who all work together to provide you with the care you need, when you need it.  Your next appointment:   6 month(s)  Provider:   Ole Holts, MD, Daphne Barrack, NP, or Ozell Jodie Passey, PA-C    Tentatively scheduled for loop insertion on 07/18/24 at noon with Dr Francyne (pending pre certification w/insurance)   Other Instructions: Use surgical scrub provided in clinic to clean left chest on the night before your loop implant and the morning of.   We recommend signing up for the patient portal called MyChart.  Sign up information is provided on this After Visit Summary.  MyChart is used to connect with patients for Virtual Visits (Telemedicine).  Patients are able to view lab/test results, encounter notes, upcoming appointments, etc.  Non-urgent messages can be sent to your provider as well.   To learn more about what you can do with MyChart, go to [email or web address].

## 2024-07-09 NOTE — Progress Notes (Signed)
 Cardiology Office Note:    Date:  07/09/2024   ID:  Robert Stark, DOB 10/20/1946, MRN [id number]  PCP:  Shlomo Darryle BROCKS, DO   [facility] Providers Cardiologist:  None Electrophysiologist:  OLE ONEIDA HOLTS, MD     Referring MD: Shlomo Darryle BROCKS, DO   Chief Complaint  Patient presents with   Atrial Fibrillation     History of Present Illness:    Robert Stark is a 78 y.o. 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  When in atrial fibrillation, he was arrhythmia unaware and presented with heart failure.  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  In May 2025 he was hospitalized at [facility] health with seizures.  The initial scan showed an abnormality in the medial posterior wall left frontal lobe initially felt to be may be an evolving infarct, but the MRI was more consistent with a tumor.  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  Due to his risk of recurrent seizure and falls and the presence of the brain tumor, it was recommended that his Xarelto  be discontinued.  It still on his medicine list here, but he is fairly sure that he has not been taking it.  He was initiated on treatment with lacosamide and left Theressa tab for seizures and has not had any recurrent events.  He is scheduled for a follow-up MRI in the next few weeks.  His neurosurgeon is Dr. Darice Pizza; his  neurologist is Dr. Russella Paddock, NP.  He quit smoking roughly 10 years ago.  Labs checked in March 2025 shows total cholesterol 96, HDL 32, triglycerides 60, LDL 50  Past Medical History:  Diagnosis Date   Hypertension     Past Surgical History:  Procedure Laterality Date   ATRIAL FIBRILLATION ABLATION N/A 10/04/2022   Procedure: ATRIAL FIBRILLATION ABLATION;  Surgeon: HOLTS OLE ONEIDA, MD;  Location: [facility] INVASIVE CV LAB;  Service: Cardiovascular;  Laterality: N/A;   BUBBLE STUDY  06/29/2022   Procedure: BUBBLE STUDY;  Surgeon: Hobart Powell BRAVO, MD;  Location: [facility] ENDOSCOPY;  Service: Cardiovascular;;   CARDIOVERSION N/A 12/28/2021   Procedure: CARDIOVERSION;  Surgeon: Okey Vina GAILS, MD;  Location: [facility] ENDOSCOPY;  Service: Cardiovascular;  Laterality: N/A;   CIRCUMCISION     PERICARDIOCENTESIS N/A 11/08/2022   Procedure: PERICARDIOCENTESIS;  Surgeon: Dann Candyce RAMAN, MD;  Location: [facility] INVASIVE CV LAB;  Service: Cardiovascular;  Laterality: N/A;   SUBXYPHOID PERICARDIAL WINDOW N/A 11/11/2022   Procedure: SUBXYPHOID PERICARDIAL WINDOW;  Surgeon: Maryjane Mt, MD;  Location: [facility] OR;  Service: Thoracic;  Laterality: N/A;   TEE WITHOUT CARDIOVERSION N/A 06/29/2022   Procedure: TRANSESOPHAGEAL ECHOCARDIOGRAM (TEE);  Surgeon: Hobart Powell BRAVO, MD;  Location: [facility] ENDOSCOPY;  Service: Cardiovascular;  Laterality: N/A;   TEE WITHOUT CARDIOVERSION N/A 11/11/2022   Procedure: TRANSESOPHAGEAL ECHOCARDIOGRAM (TEE);  Surgeon: Maryjane Mt, MD;  Location: [facility] OR;  Service: Thoracic;  Laterality: N/A;   TONSILLECTOMY AND ADENOIDECTOMY      Current Medications: Current Meds  Medication Sig   atorvastatin  (LIPITOR) 40 MG tablet Take 40 mg by mouth daily.   lacosamide (VIMPAT) 50 MG TABS tablet Take 100 mg by mouth 2 (two) times daily.   levETIRAcetam (KEPPRA) 750 MG tablet Take 750 mg by mouth 2 (two) times daily.   tamsulosin  (FLOMAX ) 0.4 MG CAPS capsule Take 0.4 mg by mouth daily. For  prostate   [DISCONTINUED] azelastine  (ASTELIN ) 0.1 % nasal spray Place 1 spray into both nostrils 2 (two) times daily.   [DISCONTINUED] losartan-hydrochlorothiazide (HYZAAR) 100-12.5 MG tablet Take 1 tablet by mouth daily.   [DISCONTINUED] metoprolol  succinate (TOPROL -XL) 50 MG 24 hr tablet Take 1 tablet (50 mg total) by mouth 2 (two) times daily.   [DISCONTINUED] potassium chloride  (KLOR-CON ) 10 MEQ tablet Take 10 mEq by mouth daily.   [DISCONTINUED] tadalafil (CIALIS) 5 MG tablet Take 5 mg by mouth daily as needed for erectile dysfunction.   [DISCONTINUED] XARELTO  20 MG TABS tablet TAKE 1 TABLET BY MOUTH EVERY DAY WITH SUPPER     Allergies:   Cheese, Bee venom, Chocolate, Cocoa, Other, and Wasp venom   Family History: The patient's family history includes Heart attack in his father and mother.  ROS:   Please see the history of present illness.     All other systems reviewed and are negative.  EKGs/Labs/Other Studies Reviewed:    The following studies were reviewed today: Echo 12/22/2022   1. Left ventricular ejection fraction, by estimation, is 60 to 65%. The  left ventricle has normal function.   2. Right ventricular systolic function is normal. The right ventricular  size is normal. There is normal pulmonary artery systolic pressure.   3. Left atrial size was moderately dilated.   4. Right atrial size was moderately dilated.   5. The aortic valve is grossly normal. Aortic valve regurgitation is not  visualized.   6. The inferior vena cava is normal in size with greater than 50%  respiratory variability, suggesting right atrial pressure of 3 mmHg.    EKG:    EKG Interpretation Date/Time:  Tuesday July 09 2024 10:03:17 EDT Ventricular Rate:  65 PR Interval:  150 QRS Duration:  96 QT Interval:  396 QTC Calculation: 411 R Axis:   59  Text Interpretation: Sinus rhythm with Premature atrial complexes Nonspecific T wave abnormality When compared with ECG of 07-Nov-2022  13:44, Premature atrial complexes are now Present Non-specific change in ST segment in Inferior leads Non-specific change in ST segment in Lateral leads Confirmed by Tarynn Garling ([id number]) on 07/09/2024 10:08:21 AM         Recent Labs: No results found for requested labs within last 365 days.  04/24/2024 Magnesium  1.8, creatinine 1.17, potassium 3.8, Hemoglobin 12.9 Recent Lipid Panel No results found for: CHOL, TRIG, HDL, CHOLHDL, VLDL, LDLCALC, LDLDIRECT March 2025  total cholesterol 96, HDL 32, triglycerides 60, LDL 50  Risk Assessment/Calculations:    CHA2DS2-VASc Score = 4   This indicates a 4.8% annual risk of stroke. The patient's score is based upon: CHF History: 1 HTN History: 1 Diabetes History: 0 Stroke History: 0 Vascular Disease History: 0 Age Score: 2 Gender Score: 0      If his right eye blindness due to ischemic optic neuritis is considered to be an equivalent of peripheral vascular disease the score is 3, if it is due to an embolic event the score is 4, with much higher embolic risk.  Physical Exam:    VS:  BP 129/70 (BP Location: Left Arm,  Patient Position: Sitting, Cuff Size: Normal)   Pulse 65   Ht 5' 6 (1.676 m)   Wt 145 lb (65.8 kg)   SpO2 98%   BMI 23.40 kg/m     Wt Readings from Last 3 Encounters:  07/09/24 145 lb (65.8 kg)  07/09/24 145 lb (65.8 kg)  04/06/23 142 lb (64.4 kg)      General: Alert, oriented x3, no distress Head: no evidence of trauma, PERRL, EOMI, no exophtalmos or lid lag, no myxedema, no xanthelasma; normal ears, nose and oropharynx Neck: normal jugular venous pulsations and no hepatojugular reflux; brisk carotid pulses without delay and no carotid bruits Chest: clear to auscultation, no signs of consolidation by percussion or palpation, normal fremitus, symmetrical and full respiratory excursions Cardiovascular: normal position and quality of the apical impulse, regular rhythm, normal first and second  heart sounds, no murmurs, rubs or gallops Abdomen: no tenderness or distention, no masses by palpation, no abnormal pulsatility or arterial bruits, normal bowel sounds, no hepatosplenomegaly Extremities: no clubbing, cyanosis or edema; 2+ radial, ulnar and brachial pulses bilaterally; 2+ right femoral, posterior tibial and dorsalis pedis pulses; 2+ left femoral, posterior tibial and dorsalis pedis pulses; no subclavian or femoral bruits Neurological: grossly nonfocal Psych: Normal mood and affect   CHA2DS2-VASc Score = 4  The patient's score is based upon: CHF History: 1 HTN History: 1 Diabetes History: 0 Stroke History: 0 Vascular Disease History: 0 Age Score: 2 Gender Score: 0            ASSESSMENT:    1. Persistent atrial fibrillation (HCC)   2. Pericardial effusion after operative procedure   3. Primary hypertension   4. Tachycardia induced cardiomyopathy (HCC)   5. Brain mass    PLAN:    In order of problems listed above:  Afib: Had a successful ablation.  No clinical recurrence of atrial fibrillation since, but he was never aware of palpitations.  Frequent PACs are seen on his electrocardiogram and are heard on physical exam.  I think it would be very useful for him to have a very reliable method to assess for atrial fibrillation recurrence and the recommended implantation of a loop recorder. CHF: Asymptomatic.  Complete recovery of LV function after correction of his tachyarrhythmia. HTN: Good blood pressure control, currently without any antihypertensive medications. HLP: Continue atorvastatin .  Satisfactory lipid profile. Putative left frontal glioma: Scheduled for a follow-up MRI scan in the next few weeks.  The loop recorder will not interfere with brain MRI scans.  Anticoagulation stopped due to the increased risk of intracranial bleeding.         Medication Adjustments/Labs and Tests Ordered: Current medicines are reviewed at length with the patient today.   Concerns regarding medicines are outlined above.  Orders Placed This Encounter  Procedures   EKG 12-Lead   No orders of the defined types were placed in this encounter.   Patient Instructions  Medication Instructions:  Your physician recommends that you continue on your current medications as directed. Please refer to the Current Medication list given to you today.  -Please make sure you are not taking Xarelto .  *If you need a refill on your cardiac medications before your next appointment, please call your pharmacy*   Follow-Up: At [facility], you and your health needs are our priority.  As part of our continuing mission to provide you with exceptional heart care, our providers are all part of one team.  This team includes your primary Cardiologist (physician)  and Advanced Practice Providers or APPs (Physician Assistants and Nurse Practitioners) who all work together to provide you with the care you need, when you need it.  Your next appointment:   To be determined   Provider:   Jerel Balding, MD    We recommend signing up for the patient portal called MyChart.  Sign up information is provided on this After Visit Summary.  MyChart is used to connect with patients for Virtual Visits (Telemedicine).  Patients are able to view lab/test results, encounter notes, upcoming appointments, etc.  Non-urgent messages can be sent to your provider as well.   To learn more about what you can do with MyChart, go to [email or web address].    Signed, Jerel Balding, MD  07/09/2024 12:12 PM    [facility]

## 2024-07-09 NOTE — Patient Instructions (Signed)
 Medication Instructions:  Your physician recommends that you continue on your current medications as directed. Please refer to the Current Medication list given to you today.  -Please make sure you are not taking Xarelto .  *If you need a refill on your cardiac medications before your next appointment, please call your pharmacy*   Follow-Up: At [facility], you and your health needs are our priority.  As part of our continuing mission to provide you with exceptional heart care, our providers are all part of one team.  This team includes your primary Cardiologist (physician) and Advanced Practice Providers or APPs (Physician Assistants and Nurse Practitioners) who all work together to provide you with the care you need, when you need it.  Your next appointment:   To be determined   Provider:   Jerel Balding, MD    We recommend signing up for the patient portal called MyChart.  Sign up information is provided on this After Visit Summary.  MyChart is used to connect with patients for Virtual Visits (Telemedicine).  Patients are able to view lab/test results, encounter notes, upcoming appointments, etc.  Non-urgent messages can be sent to your provider as well.   To learn more about what you can do with MyChart, go to [email or web address].

## 2024-07-10 ENCOUNTER — Ambulatory Visit: Payer: Self-pay | Admitting: Pulmonary Disease

## 2024-07-10 LAB — BASIC METABOLIC PANEL WITH GFR
BUN/Creatinine Ratio: 30 — ABNORMAL HIGH (ref 10–24)
BUN: 34 mg/dL — ABNORMAL HIGH (ref 8–27)
CO2: 22 mmol/L (ref 20–29)
Calcium: 9.1 mg/dL (ref 8.6–10.2)
Chloride: 105 mmol/L (ref 96–106)
Creatinine, Ser: 1.14 mg/dL (ref 0.76–1.27)
Glucose: 133 mg/dL — ABNORMAL HIGH (ref 70–99)
Potassium: 4.3 mmol/L (ref 3.5–5.2)
Sodium: 143 mmol/L (ref 134–144)
eGFR: 66 mL/min/1.73 (ref >59)

## 2024-07-18 ENCOUNTER — Ambulatory Visit: Attending: Cardiovascular Disease | Admitting: Cardiovascular Disease

## 2024-07-18 DIAGNOSIS — I480 Paroxysmal atrial fibrillation: Secondary | ICD-10-CM | POA: Diagnosis not present

## 2024-07-18 DIAGNOSIS — Z95818 Presence of other cardiac implants and grafts: Secondary | ICD-10-CM

## 2024-07-18 MED ORDER — LIDOCAINE-EPINEPHRINE 1 %-1:100000 IJ SOLN
10.0000 mL | Freq: Once | INTRAMUSCULAR | Status: AC
  Administered 2024-07-18: 10 mL via INTRADERMAL

## 2024-07-18 NOTE — Patient Instructions (Addendum)
 Wound Check- Thursday 08/08/24 at 11:40- Office Visit  Implantable Loop Recorder Placement, Care After This sheet gives you information about how to care for yourself after your procedure. Your health care provider may also give you more specific instructions. If you have problems or questions, contact your health care provider. What can I expect after the procedure? After the procedure, it is common to have: Soreness or discomfort near the incision. Some swelling or bruising near the incision.  Follow these instructions at home: Incision care  Monitor your cardiac device site for redness, swelling, and drainage. Call the device clinic at [phone] if you experience these symptoms or fever/chills.  Keep the large square bandage on your site for 24 hours and then you may remove it yourself. Keep the steri-strips underneath in place.  (May take off bandage Friday afternoon)  You may shower after 72 hours / 3 days from your procedure with the steri-strips in place. They will usually fall off on their own, or may be removed after 10 days. Pat dry. ( If you shower- keep steri-strips covered with plastic, just until Monday. You may take a shower without having steri-strips covered on Monday)  Avoid lotions, ointments, or perfumes over your incision until it is well-healed.  Please do not submerge in water  until your site is completely healed.   Your device is MRI compatible.   Remote monitoring is used to monitor your cardiac device from home. This monitoring is scheduled every month by our office. It allows us  to keep an eye on the function of your device to ensure it is working properly.  If your wound site starts to bleed apply pressure.    For help with the monitor please call [employer] Monitor Support Specialist directly at [phone].    If you have any questions/concerns please call the device clinic at [phone].  Activity  Return to your normal activities.  General  instructions Follow instructions from your health care provider about how to manage your implantable loop recorder and transmit the information. Learn how to activate a recording if this is necessary for your type of device. You may go through a metal detection gate, and you may let someone hold a metal detector over your chest. Show your ID card if needed. Do not have an MRI unless you check with your health care provider first. Take over-the-counter and prescription medicines only as told by your health care provider. Keep all follow-up visits as told by your health care provider. This is important. Contact a health care provider if: You have redness, swelling, or pain around your incision. You have a fever. You have pain that is not relieved by your pain medicine. You have triggered your device because of fainting (syncope) or because of a heartbeat that feels like it is racing, slow, fluttering, or skipping (palpitations). Get help right away if you have: Chest pain. Difficulty breathing. Summary After the procedure, it is common to have soreness or discomfort near the incision. Change your dressing as told by your health care provider. Follow instructions from your health care provider about how to manage your implantable loop recorder and transmit the information. Keep all follow-up visits as told by your health care provider. This is important. This information is not intended to replace advice given to you by your health care provider. Make sure you discuss any questions you have with your health care provider. Document Released: 11/16/2015 Document Revised: 01/20/2018 Document Reviewed: 01/20/2018 Elsevier Patient Education  2020 [employer].

## 2024-07-18 NOTE — Progress Notes (Signed)
 LOOP RECORDER IMPLANT   Procedure report  Procedure performed:  Loop recorder implantation   Reason for procedure:  atrial fibrillation management Procedure performed by:  Jerel Balding, MD  Complications:  None  Estimated blood loss:  <5 mL  Medications administered during procedure:  Lidocaine  1% with 1/10,000 epinephrine  10 mL locally Device details:  Medtronic Linq II model number [id number], serial number [id number]  Procedure details:  After the risks and benefits of the procedure were discussed the patient provided informed consent. Burlene out identifying patient and procedure was performed.  The patient was prepped and draped in usual sterile fashion. Local anesthesia was administered to an area 2 cm to the left of the sternum in the 4th intercostal space. A cutaneous incision was made using the incision tool. The introducer was then used to create a subcutaneous tunnel and carefully deploy the device. Local pressure was held to ensure hemostasis.  The incision was closed with SteriStrips and a sterile dressing was applied.  R waves 0.31 mV.  Demetruis Depaul, MD, FACC [facility] [phone] office [phone] pager 07/18/2024 12:39 PM

## 2024-07-19 ENCOUNTER — Telehealth: Payer: Self-pay

## 2024-07-19 NOTE — Telephone Encounter (Signed)
  Loop Recorder Follow up   Is patient connected to [employer]? No   Have steri-strips fallen off or been removed? No   Does the patient need in office follow up? No   Please continue to monitor your cardiac device site for redness, swelling, and drainage. Call the device clinic at [phone] if you experience these symptoms, fever/chills, or have questions about your device.   Remote monitoring is used to monitor your cardiac device from home. This monitoring is scheduled every month by our office. It allows us  to keep an eye on the functioning of your device to ensure it is working properly.  Pt called in wanting to know if it is okay that he takes tylenol  because he is now in pain. Pt will plug in monitor.

## 2024-07-23 NOTE — Telephone Encounter (Signed)
 Original Discontinued 7/22 per records

## 2024-08-08 ENCOUNTER — Ambulatory Visit: Attending: Cardiovascular Disease | Admitting: Cardiovascular Disease

## 2024-08-08 VITALS — BP 128/70 | HR 61 | Ht 66.0 in | Wt 142.8 lb

## 2024-08-08 DIAGNOSIS — Z95818 Presence of other cardiac implants and grafts: Secondary | ICD-10-CM

## 2024-08-08 NOTE — Progress Notes (Signed)
 He is here for an in person wound check after implantable loop recorder placement 2 weeks ago.  The site has healed very nicely.  Steri-Strips are still in place but I removed them today.  There is a well-formed scar.  There is no evidence of swelling, tenderness, warmth or any other signs of infection.  Discussed the remote monitoring protocols.

## 2024-08-08 NOTE — Patient Instructions (Signed)
 Medication Instructions:  No changes *If you need a refill on your cardiac medications before your next appointment, please call your pharmacy*  Lab Work: None ordered If you have labs (blood work) drawn today and your tests are completely normal, you will receive your results only by: MyChart Message (if you have MyChart) OR A paper copy in the mail If you have any lab test that is abnormal or we need to change your treatment, we will call you to review the results.  Testing/Procedures: None ordered  Follow-Up: At [facility], you and your health needs are our priority.  As part of our continuing mission to provide you with exceptional heart care, our providers are all part of one team.  This team includes your primary Cardiologist (physician) and Advanced Practice Providers or APPs (Physician Assistants and Nurse Practitioners) who all work together to provide you with the care you need, when you need it.  Your next appointment:   Follow up at already scheduled appointments  We recommend signing up for the patient portal called MyChart.  Sign up information is provided on this After Visit Summary.  MyChart is used to connect with patients for Virtual Visits (Telemedicine).  Patients are able to view lab/test results, encounter notes, upcoming appointments, etc.  Non-urgent messages can be sent to your provider as well.   To learn more about what you can do with MyChart, go to [email or web address].

## 2024-08-22 ENCOUNTER — Ambulatory Visit (INDEPENDENT_AMBULATORY_CARE_PROVIDER_SITE_OTHER)

## 2024-08-22 DIAGNOSIS — I480 Paroxysmal atrial fibrillation: Secondary | ICD-10-CM | POA: Diagnosis not present

## 2024-08-22 LAB — CUP PACEART REMOTE DEVICE CHECK
Date Time Interrogation Session: 20250904011814
Implantable Pulse Generator Implant Date: 20250731

## 2024-08-23 ENCOUNTER — Ambulatory Visit: Payer: Self-pay | Admitting: Cardiovascular Disease

## 2024-09-02 NOTE — Progress Notes (Signed)
 Remote Loop Recorder Transmission

## 2024-09-23 ENCOUNTER — Telehealth: Payer: Self-pay

## 2024-09-23 ENCOUNTER — Encounter

## 2024-09-23 ENCOUNTER — Ambulatory Visit

## 2024-09-23 DIAGNOSIS — I480 Paroxysmal atrial fibrillation: Secondary | ICD-10-CM | POA: Diagnosis not present

## 2024-09-23 LAB — CUP PACEART REMOTE DEVICE CHECK
Date Time Interrogation Session: 20251005234236
Implantable Pulse Generator Implant Date: 20250731

## 2024-09-23 NOTE — Telephone Encounter (Addendum)
 Alert remote transmission:  Tachy 14 logged tachy events, 2 EGM's, longest duration 43sec in duration, HR's 200-261, irregular R-R, WCT with periods of regularity  Alert remote transmission:  Tachy Episode #23 c/w NSVT, HR 207-214,  > 20 beats - Route to triage 2 additional tachy events, one with some oversensing, longest duration 18sec HR 194-200    Numerous Events: 10/4-10/5  Unable to reach patient by phone to assess symptoms.  LM on VM to call back.

## 2024-09-23 NOTE — Telephone Encounter (Signed)
 He needs to restart the metoprolol  succinate 50 mg once daily.

## 2024-09-24 MED ORDER — METOPROLOL SUCCINATE ER 50 MG PO TB24: 50.0000 mg | ORAL_TABLET | Freq: Every day | ORAL | 6 refills | Status: AC

## 2024-09-24 NOTE — Telephone Encounter (Signed)
Attempted to contact patient. No answer, left message to call back

## 2024-09-24 NOTE — Telephone Encounter (Signed)
 Spoke with pt who reports he is concerned he might be in Afib as he has felt some dizziness and generally not feeling great  Pt denies current CP, SOB or dizziness.  Pt has not started Metoprolol  Succinate 50mg  yet.   Recommended pt send another device transmission for review.  Encouraged pt not to drive if feeling dizzy or lightheaded.  Reviewed ED precautions.  Pt verbalizes understanding and agrees with current plan.

## 2024-09-24 NOTE — Progress Notes (Signed)
 Remote Loop Recorder Transmission

## 2024-09-24 NOTE — Telephone Encounter (Signed)
 Yes, please restart the metoprolol  and make him a follow up appt, please

## 2024-09-24 NOTE — Telephone Encounter (Signed)
 Pt requesting a c/b from nurse being that he has questions regarding medication. Please advise

## 2024-09-24 NOTE — Telephone Encounter (Addendum)
 Unable to get patient back on the phone. Called patient's emergency contact Delana again.  Reviewed patient's transmissions - some noted AF events, numerous tachy episodes, presenting heart rates appear atrial driven tachycardia 100-150's.   Patient needs to get back on his Metoprolol  and should not be driving with symptoms and fast heart rates.  She will communicate with him and they will wait to hear about an appointment for follow up.   ER precautions given if patient's symptoms become severe and if he experiences any syncopal or near syncopal events. Granddaughter verbalizes understanding and will pass along.

## 2024-09-24 NOTE — Telephone Encounter (Signed)
LM on patient's VM to call back

## 2024-09-24 NOTE — Telephone Encounter (Signed)
 Spoke with patient's granddaughter, Delana, with just general info to have patient call as she is not on DPR; however, is listed as emergency contact.   Delana states that Dr. Francyne had called them last night and said he was sending in some medication.  I did not have that in my note.  She sent her grandfather to pick it up this am but they didn't have anything.  I explained that I was calling him to tell him about starting the new medication, Metoprolol  and that I will go ahead and send it in since Dr. JAYSON has already discussed with them.   Delana thanks me and will have him pick it up and start it today.

## 2024-09-25 NOTE — Telephone Encounter (Signed)
 Spoke w/ patient - he is scheduled to see Brandi on Friday, 10/10

## 2024-09-27 ENCOUNTER — Ambulatory Visit: Attending: Pulmonary Disease | Admitting: Pulmonary Disease

## 2024-09-27 NOTE — Telephone Encounter (Signed)
 Message received from [facility] in Device - can you call this pt when you get a moment and reschedule him because he has the flu and they dont want him coming in - lvmtcb to r/s to another day. Patient is not on MyChart, nor has texting options chosen.

## 2024-09-27 NOTE — Progress Notes (Deleted)
  Electrophysiology Office Note:   Date:  09/27/2024  ID:  Robert Stark, DOB 1946/07/24, MRN [id number]  Primary Cardiologist: None Primary Heart Failure: None Electrophysiologist: OLE ONEIDA HOLTS, MD  {Click to update primary MD,subspecialty MD or APP then REFRESH:1}    History of Present Illness:   Robert Stark is a 78 y.o. 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    Patient reports ***.    He***denies chest pain, palpitations, dyspnea, PND, orthopnea, nausea, vomiting, dizziness, syncope, edema, weight gain, or early satiety.   Review of systems complete and found to be negative unless listed in HPI.   EP Information / Studies Reviewed:    EKG is ordered today. Personal review as below.       Arrhythmia / AAD / Pertinent EP Studies AF  EPS 10/04/22 > PVI ablation, posterior wall ablation   Device MDT ILR 07/18/24 > for AF    Risk Assessment/Calculations:    CHA2DS2-VASc Score = 4  {Confirm score is correct.  If not, click here to update score.  REFRESH note.  :1} This indicates a 4.8% annual risk of stroke. The patient's score is based upon: CHF History: 1 HTN History: 1 Diabetes History: 0 Stroke History: 0 Vascular Disease History: 0 Age Score: 2 Gender Score: 0   {This patient has a significant risk of stroke if diagnosed with atrial fibrillation.  Please consider VKA or DOAC agent for anticoagulation if the bleeding risk is acceptable.   You can also use the SmartPhrase .[email or web address] for documentation.   :[id number]} No BP recorded.  {Refresh Note OR Click here to enter BP  :1}***        Physical Exam:   VS:  There were no vitals taken for this visit.   Wt Readings from Last 3 Encounters:  08/08/24 142 lb 12.8 oz (64.8 kg)  07/09/24 145 lb (65.8  kg)  07/09/24 145 lb (65.8 kg)     GEN: Well nourished, well developed in no acute distress NECK: No JVD; No carotid bruits CARDIAC: {EPRHYTHM:28826}, no murmurs, rubs, gallops RESPIRATORY:  Clear to auscultation without rales, wheezing or rhonchi  ABDOMEN: Soft, non-tender, non-distended EXTREMITIES:  No edema; No deformity   ASSESSMENT AND PLAN:    Persistent Atrial Fibrillation  Atrial Flutter  CHA2DS2-VASc 4, s/p ablation 09/2022  -OAC as below -all meds previously stopped due to brain mass / seizures  -ILR review 10/7-10/10 period shows no AF, 1.1% PVC's. Prior episodes of tachy, AF with longest episode of AF lasting 22 min (12 episodes), tachy (32 episodes)  -resume Toprol  50 mg daily *** BP? ***  Secondary Hypercoagulable State  -OAC on hold given brain mass > [facility] from [facility] show can revisit resuming pending clinical course and resolution of seizure activity    CHF  -per Dr. 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 Francyne {EPFOLLOW LE:71826}  Signed, Daphne Barrack, NP-C, AGACNP-BC [facility] - Electrophysiology  09/27/2024, 7:15 AM

## 2024-09-30 ENCOUNTER — Encounter: Payer: Self-pay | Admitting: Pulmonary Disease

## 2024-10-07 ENCOUNTER — Ambulatory Visit: Payer: Self-pay | Admitting: Cardiovascular Disease

## 2024-10-07 NOTE — Telephone Encounter (Signed)
 Left a message saying that Dr Francyne reviewed your ILR download which showed some abnormalities. (This was while you had influenza and were not taking Metoprolol ) but he would like for you to be seen by either himself or Dr Cindie before the end of the year.   Left call back number as well.

## 2024-10-11 ENCOUNTER — Telehealth: Payer: Self-pay | Admitting: Emergency Medicine

## 2024-10-11 NOTE — Telephone Encounter (Signed)
 Left message informing the patient: Needs appt with either Dr Cindie or Dr Francyne before the end of the year due to abnormal loop report. Asked to call for appointment. Callback number given

## 2024-10-17 NOTE — Telephone Encounter (Signed)
 Left message and call back number that Dr Francyne would like the patient to be seen before the end of the year.

## 2024-10-21 NOTE — Telephone Encounter (Signed)
 Lvmtcb to schedule appointment with Dr. Francyne or Dr. Cindie for abnormal loop report per Dr. Francyne.

## 2024-10-21 NOTE — Telephone Encounter (Signed)
 Lvmtcb to schedule appointment with Dr. Francyne or Dr. Cindie for abnormal loop report per Dr. Francyne. See 10/24 phone note.

## 2024-10-23 NOTE — Telephone Encounter (Signed)
 Spoke w/ patient  - he is scheduled with Dr. Cindie on 12/9 for follow up on abnormal loop transmission.

## 2024-10-24 ENCOUNTER — Ambulatory Visit (INDEPENDENT_AMBULATORY_CARE_PROVIDER_SITE_OTHER)

## 2024-10-24 ENCOUNTER — Encounter

## 2024-10-24 DIAGNOSIS — I480 Paroxysmal atrial fibrillation: Secondary | ICD-10-CM

## 2024-10-24 LAB — CUP PACEART REMOTE DEVICE CHECK
Date Time Interrogation Session: 20251106000608
Implantable Pulse Generator Implant Date: 20250731

## 2024-10-25 ENCOUNTER — Ambulatory Visit: Payer: Self-pay | Admitting: Cardiovascular Disease

## 2024-10-25 NOTE — Progress Notes (Signed)
 Remote Loop Recorder Transmission

## 2024-11-24 ENCOUNTER — Ambulatory Visit

## 2024-11-24 NOTE — Progress Notes (Unsigned)
 Electrophysiology Office Follow up Visit Note:    Date:  11/26/2024   ID:  Robert Stark, DOB [birth year], MRN [id number]  PCP:  Shlomo Darryle BROCKS, DO  [facility] Cardiologist:  None  [facility] Electrophysiologist:  OLE ONEIDA HOLTS, MD    Interval History:     Robert Stark is a 78 y.o. male who presents for a follow up visit.   The patient presents for follow-up after loop recorder monitoring revealed episodes of paroxysmal atrial fibrillation with rapid ventricular rates.  The patient was previously seen by [facility] July 09, 2024.  I have met the patient in the past, January 2024.  The patient has a history of atrial fibrillation ablation in 2023.  He subsequently developed hemorrhagic pericarditis with a pericardial effusion requiring pericardial window.  The patient also has a medical history of glioblastoma, seizures, hyperlipidemia, malnutrition and chronic systolic heart failure.  The most recent loop monitor report demonstrated 6 tachycardia episodes with the longest lasting 97 seconds.  Today he is doing well.  Tells me that he has been having focal seizures involving the right arm.  He is completely awake during these episodes but the right arm spasms.  His neurosurgical team has told him this is secondary to his glioma that is in the left hemisphere of the brain.  They last seconds at a time.  These have improved significantly after being started on antiepileptic medications.  He confirms no syncope or presyncope to me today.  No palpitations.      Past medical, surgical, social and family history were reviewed.  ROS:   Please see the history of present illness.    All other systems reviewed and are negative.  EKGs/Labs/Other Studies Reviewed:    The following studies were reviewed today:          Physical Exam:    VS:  BP 124/82   Pulse (!) 59   Ht 5' 6 (1.676 m)   Wt 141 lb 9.6 oz (64.2 kg)   SpO2 95%   BMI 22.85 kg/m     Wt Readings from  Last 3 Encounters:  11/26/24 141 lb 9.6 oz (64.2 kg)  08/08/24 142 lb 12.8 oz (64.8 kg)  07/09/24 145 lb (65.8 kg)     GEN: no distress CARD: RRR, No MRG RESP: No IWOB. CTAB.      ASSESSMENT:    1. Paroxysmal atrial fibrillation (HCC)    PLAN:    In order of problems listed above:  #Atrial fibrillation Recurrent after 2023 catheter ablation.  Prior catheter ablation was complicated by hemorrhagic pericarditis requiring pericardial window.  Is currently off anticoagulation given history of intracranial mass and seizures. The burden of his atrial fibrillation is actually quite low.  He is pleased with the control of his arrhythmia.  Would continue metoprolol .   #High ventricular rate episodes on loop recorder Spent a lot of time reviewing his high ventricular rate episodes.  I also reviewed an in person with Dr. Francyne who who is following his loop recorder.  Some of these episodes appear to be noise from probable atrial flutter.  For example, episode 101 shows what appear to be flutter waves at 300 bpm overlying low amplitude QRS complexes.  He was asymptomatic during all of these episodes.  Has not had any syncopal episodes to suggest true ventricular arrhythmias.  I discussed my upcoming departure from [facility] during today's clinic appointment.  The patient will continue to follow-up with one of my  EP partners moving forward.  Follow-up 6 months with Dr. JAYSON in 1 year with Dr. Kennyth.    Signed, Ole Holts, MD, [facility] 11/26/2024 11:55 AM    Electrophysiology [facility]

## 2024-11-25 ENCOUNTER — Encounter

## 2024-11-25 LAB — CUP PACEART REMOTE DEVICE CHECK
Date Time Interrogation Session: 20251206232737
Implantable Pulse Generator Implant Date: 20250731

## 2024-11-26 ENCOUNTER — Encounter: Payer: Self-pay | Admitting: Cardiology

## 2024-11-26 ENCOUNTER — Ambulatory Visit: Attending: Cardiology | Admitting: Cardiology

## 2024-11-26 VITALS — BP 124/82 | HR 59 | Ht 66.0 in | Wt 141.6 lb

## 2024-11-26 DIAGNOSIS — I480 Paroxysmal atrial fibrillation: Secondary | ICD-10-CM

## 2024-11-27 ENCOUNTER — Ambulatory Visit: Payer: Self-pay | Admitting: Cardiovascular Disease

## 2024-12-03 NOTE — Progress Notes (Signed)
 Remote Loop Recorder Transmission

## 2024-12-25 ENCOUNTER — Ambulatory Visit

## 2024-12-25 ENCOUNTER — Ambulatory Visit: Payer: Self-pay | Admitting: Cardiovascular Disease

## 2024-12-25 DIAGNOSIS — I480 Paroxysmal atrial fibrillation: Secondary | ICD-10-CM | POA: Diagnosis not present

## 2024-12-25 LAB — CUP PACEART REMOTE DEVICE CHECK
Date Time Interrogation Session: 20260106233745
Implantable Pulse Generator Implant Date: 20250731

## 2024-12-26 ENCOUNTER — Encounter

## 2024-12-26 NOTE — Progress Notes (Signed)
 Remote Loop Recorder Transmission

## 2025-01-25 ENCOUNTER — Encounter

## 2025-01-27 ENCOUNTER — Encounter

## 2025-02-25 ENCOUNTER — Encounter
